# Patient Record
Sex: Female | Born: 1940 | Race: White | Hispanic: No | Smoking: Former smoker
Health system: Southern US, Community
[De-identification: ages and names within clinical notes are randomized; demographics above are authoritative.]

## PROBLEM LIST (undated history)

## (undated) DIAGNOSIS — K2210 Ulcer of esophagus without bleeding: Secondary | ICD-10-CM

## (undated) DIAGNOSIS — L255 Unspecified contact dermatitis due to plants, except food: Secondary | ICD-10-CM

## (undated) DIAGNOSIS — R0600 Dyspnea, unspecified: Secondary | ICD-10-CM

## (undated) DIAGNOSIS — M899 Disorder of bone, unspecified: Secondary | ICD-10-CM

## (undated) DIAGNOSIS — N952 Postmenopausal atrophic vaginitis: Secondary | ICD-10-CM

## (undated) DIAGNOSIS — R002 Palpitations: Secondary | ICD-10-CM

## (undated) DIAGNOSIS — D239 Other benign neoplasm of skin, unspecified: Secondary | ICD-10-CM

## (undated) DIAGNOSIS — R42 Dizziness and giddiness: Secondary | ICD-10-CM

## (undated) DIAGNOSIS — D229 Melanocytic nevi, unspecified: Secondary | ICD-10-CM

## (undated) DIAGNOSIS — Z85828 Personal history of other malignant neoplasm of skin: Secondary | ICD-10-CM

## (undated) DIAGNOSIS — K5790 Diverticulosis of intestine, part unspecified, without perforation or abscess without bleeding: Secondary | ICD-10-CM

## (undated) DIAGNOSIS — Z8673 Personal history of transient ischemic attack (TIA), and cerebral infarction without residual deficits: Secondary | ICD-10-CM

## (undated) DIAGNOSIS — R0609 Other forms of dyspnea: Secondary | ICD-10-CM

## (undated) DIAGNOSIS — Z853 Personal history of malignant neoplasm of breast: Secondary | ICD-10-CM

## (undated) DIAGNOSIS — N895 Stricture and atresia of vagina: Secondary | ICD-10-CM

## (undated) DIAGNOSIS — B009 Herpesviral infection, unspecified: Secondary | ICD-10-CM

## (undated) DIAGNOSIS — C649 Malignant neoplasm of unspecified kidney, except renal pelvis: Secondary | ICD-10-CM

## (undated) DIAGNOSIS — S72009A Fracture of unspecified part of neck of unspecified femur, initial encounter for closed fracture: Secondary | ICD-10-CM

## (undated) DIAGNOSIS — J45909 Unspecified asthma, uncomplicated: Secondary | ICD-10-CM

## (undated) DIAGNOSIS — M949 Disorder of cartilage, unspecified: Secondary | ICD-10-CM

## (undated) DIAGNOSIS — K529 Noninfective gastroenteritis and colitis, unspecified: Secondary | ICD-10-CM

## (undated) DIAGNOSIS — R5383 Other fatigue: Secondary | ICD-10-CM

## (undated) DIAGNOSIS — K219 Gastro-esophageal reflux disease without esophagitis: Secondary | ICD-10-CM

## (undated) DIAGNOSIS — R5381 Other malaise: Secondary | ICD-10-CM

## (undated) DIAGNOSIS — Z720 Tobacco use: Secondary | ICD-10-CM

## (undated) DIAGNOSIS — K5190 Ulcerative colitis, unspecified, without complications: Secondary | ICD-10-CM

## (undated) DIAGNOSIS — G1221 Amyotrophic lateral sclerosis: Secondary | ICD-10-CM

## (undated) DIAGNOSIS — G1220 Motor neuron disease, unspecified: Secondary | ICD-10-CM

## (undated) HISTORY — DX: Fracture of unspecified part of neck of unspecified femur, initial encounter for closed fracture: S72.009A

## (undated) HISTORY — DX: Diverticulosis of intestine, part unspecified, without perforation or abscess without bleeding: K57.90

## (undated) HISTORY — DX: Other fatigue: R53.83

## (undated) HISTORY — DX: Ulcer of esophagus without bleeding: K22.10

## (undated) HISTORY — DX: Dizziness and giddiness: R42

## (undated) HISTORY — DX: Personal history of other malignant neoplasm of skin: Z85.828

## (undated) HISTORY — DX: Melanocytic nevi, unspecified: D22.9

## (undated) HISTORY — DX: Postmenopausal atrophic vaginitis: N95.2

## (undated) HISTORY — DX: Disorder of bone, unspecified: M89.9

## (undated) HISTORY — DX: Other malaise: R53.81

## (undated) HISTORY — DX: Tobacco use: Z72.0

## (undated) HISTORY — DX: Personal history of malignant neoplasm of breast: Z85.3

## (undated) HISTORY — DX: Motor neuron disease, unspecified: G12.20

## (undated) HISTORY — DX: Palpitations: R00.2

## (undated) HISTORY — DX: Other forms of dyspnea: R06.09

## (undated) HISTORY — DX: Ulcerative colitis, unspecified, without complications: K51.90

## (undated) HISTORY — DX: Noninfective gastroenteritis and colitis, unspecified: K52.9

## (undated) HISTORY — DX: Unspecified contact dermatitis due to plants, except food: L25.5

## (undated) HISTORY — DX: Dyspnea, unspecified: R06.00

## (undated) HISTORY — DX: Gastro-esophageal reflux disease without esophagitis: K21.9

## (undated) HISTORY — DX: Disorder of cartilage, unspecified: M94.9

## (undated) HISTORY — DX: Personal history of transient ischemic attack (TIA), and cerebral infarction without residual deficits: Z86.73

## (undated) HISTORY — DX: Amyotrophic lateral sclerosis: G12.21

## (undated) HISTORY — PX: APPENDECTOMY: SHX54

## (undated) HISTORY — DX: Herpesviral infection, unspecified: B00.9

## (undated) HISTORY — DX: Stricture and atresia of vagina: N89.5

## (undated) HISTORY — DX: Unspecified asthma, uncomplicated: J45.909

## (undated) HISTORY — DX: Other benign neoplasm of skin, unspecified: D23.9

## (undated) HISTORY — DX: Malignant neoplasm of unspecified kidney, except renal pelvis: C64.9

---

## 1978-10-23 HISTORY — PX: MELANOMA EXCISION: SHX5266

## 1984-10-23 HISTORY — PX: ABDOMINAL HYSTERECTOMY: SHX81

## 1993-10-23 HISTORY — PX: BREAST LUMPECTOMY: SHX2

## 1998-10-23 HISTORY — PX: NEPHRECTOMY: SHX65

## 2000-10-23 HISTORY — PX: ESOPHAGOGASTRODUODENOSCOPY: SHX1529

## 2003-10-24 HISTORY — PX: HIP FRACTURE SURGERY: SHX118

## 2004-06-13 ENCOUNTER — Other Ambulatory Visit: Payer: Self-pay

## 2004-07-23 ENCOUNTER — Encounter: Payer: Self-pay | Admitting: Internal Medicine

## 2004-08-02 ENCOUNTER — Encounter: Payer: Self-pay | Admitting: Orthopaedic Surgery

## 2004-08-23 ENCOUNTER — Encounter: Payer: Self-pay | Admitting: Orthopaedic Surgery

## 2004-09-28 ENCOUNTER — Ambulatory Visit: Payer: Self-pay | Admitting: Urology

## 2004-11-30 ENCOUNTER — Ambulatory Visit: Payer: Self-pay | Admitting: Family Medicine

## 2004-12-08 ENCOUNTER — Other Ambulatory Visit: Admission: RE | Admit: 2004-12-08 | Discharge: 2004-12-08 | Payer: Self-pay | Admitting: Family Medicine

## 2004-12-08 ENCOUNTER — Ambulatory Visit: Payer: Self-pay | Admitting: Family Medicine

## 2005-01-10 ENCOUNTER — Ambulatory Visit: Payer: Self-pay | Admitting: Family Medicine

## 2005-02-01 ENCOUNTER — Ambulatory Visit: Payer: Self-pay | Admitting: General Surgery

## 2005-05-12 ENCOUNTER — Ambulatory Visit: Payer: Self-pay | Admitting: Family Medicine

## 2005-05-14 ENCOUNTER — Inpatient Hospital Stay: Payer: Self-pay | Admitting: Internal Medicine

## 2005-05-14 ENCOUNTER — Other Ambulatory Visit: Payer: Self-pay

## 2005-05-26 ENCOUNTER — Ambulatory Visit: Payer: Self-pay | Admitting: Family Medicine

## 2005-06-27 ENCOUNTER — Encounter: Payer: Self-pay | Admitting: Family Medicine

## 2005-07-05 ENCOUNTER — Ambulatory Visit: Payer: Self-pay | Admitting: Family Medicine

## 2005-07-23 ENCOUNTER — Encounter: Payer: Self-pay | Admitting: Family Medicine

## 2005-08-23 ENCOUNTER — Encounter: Payer: Self-pay | Admitting: Family Medicine

## 2005-08-28 ENCOUNTER — Ambulatory Visit: Payer: Self-pay | Admitting: Orthopaedic Surgery

## 2005-09-21 ENCOUNTER — Encounter: Payer: Self-pay | Admitting: Orthopaedic Surgery

## 2005-09-22 ENCOUNTER — Encounter: Payer: Self-pay | Admitting: Orthopaedic Surgery

## 2005-10-09 ENCOUNTER — Ambulatory Visit: Payer: Self-pay | Admitting: Urology

## 2005-12-06 ENCOUNTER — Emergency Department: Payer: Self-pay | Admitting: Emergency Medicine

## 2005-12-06 ENCOUNTER — Other Ambulatory Visit: Payer: Self-pay

## 2005-12-08 ENCOUNTER — Ambulatory Visit: Payer: Self-pay | Admitting: Family Medicine

## 2005-12-10 ENCOUNTER — Encounter: Admission: RE | Admit: 2005-12-10 | Discharge: 2005-12-10 | Payer: Self-pay | Admitting: Family Medicine

## 2005-12-15 ENCOUNTER — Ambulatory Visit: Payer: Self-pay | Admitting: Family Medicine

## 2006-02-22 ENCOUNTER — Ambulatory Visit: Payer: Self-pay | Admitting: General Surgery

## 2006-07-12 ENCOUNTER — Other Ambulatory Visit: Admission: RE | Admit: 2006-07-12 | Discharge: 2006-07-12 | Payer: Self-pay | Admitting: Family Medicine

## 2006-07-12 ENCOUNTER — Ambulatory Visit: Payer: Self-pay | Admitting: Family Medicine

## 2006-07-12 ENCOUNTER — Encounter: Payer: Self-pay | Admitting: Family Medicine

## 2006-07-12 LAB — CONVERTED CEMR LAB: Pap Smear: NORMAL

## 2006-08-08 LAB — HM PAP SMEAR: HM Pap smear: NORMAL

## 2006-09-17 ENCOUNTER — Ambulatory Visit: Payer: Self-pay | Admitting: Family Medicine

## 2006-10-08 ENCOUNTER — Ambulatory Visit: Payer: Self-pay | Admitting: Urology

## 2006-10-09 ENCOUNTER — Ambulatory Visit: Payer: Self-pay | Admitting: Urology

## 2007-04-22 ENCOUNTER — Ambulatory Visit: Payer: Self-pay | Admitting: General Surgery

## 2007-05-16 ENCOUNTER — Encounter: Payer: Self-pay | Admitting: Family Medicine

## 2007-05-16 DIAGNOSIS — R002 Palpitations: Secondary | ICD-10-CM | POA: Insufficient documentation

## 2007-05-16 DIAGNOSIS — K219 Gastro-esophageal reflux disease without esophagitis: Secondary | ICD-10-CM | POA: Insufficient documentation

## 2007-05-16 DIAGNOSIS — Z853 Personal history of malignant neoplasm of breast: Secondary | ICD-10-CM

## 2007-05-16 DIAGNOSIS — Z85828 Personal history of other malignant neoplasm of skin: Secondary | ICD-10-CM

## 2007-05-16 DIAGNOSIS — M949 Disorder of cartilage, unspecified: Secondary | ICD-10-CM

## 2007-05-16 DIAGNOSIS — B009 Herpesviral infection, unspecified: Secondary | ICD-10-CM | POA: Insufficient documentation

## 2007-05-16 DIAGNOSIS — M899 Disorder of bone, unspecified: Secondary | ICD-10-CM | POA: Insufficient documentation

## 2007-05-16 DIAGNOSIS — K5190 Ulcerative colitis, unspecified, without complications: Secondary | ICD-10-CM | POA: Insufficient documentation

## 2007-05-16 DIAGNOSIS — C649 Malignant neoplasm of unspecified kidney, except renal pelvis: Secondary | ICD-10-CM | POA: Insufficient documentation

## 2007-05-16 DIAGNOSIS — J45909 Unspecified asthma, uncomplicated: Secondary | ICD-10-CM | POA: Insufficient documentation

## 2007-05-17 ENCOUNTER — Ambulatory Visit: Payer: Self-pay | Admitting: Family Medicine

## 2007-08-28 ENCOUNTER — Telehealth: Payer: Self-pay | Admitting: Family Medicine

## 2007-10-11 ENCOUNTER — Encounter: Payer: Self-pay | Admitting: Family Medicine

## 2007-10-11 ENCOUNTER — Ambulatory Visit: Payer: Self-pay | Admitting: Urology

## 2007-11-11 ENCOUNTER — Encounter: Payer: Self-pay | Admitting: Family Medicine

## 2007-11-11 ENCOUNTER — Telehealth (INDEPENDENT_AMBULATORY_CARE_PROVIDER_SITE_OTHER): Payer: Self-pay | Admitting: *Deleted

## 2007-11-28 ENCOUNTER — Telehealth: Payer: Self-pay | Admitting: Family Medicine

## 2007-12-24 ENCOUNTER — Telehealth: Payer: Self-pay | Admitting: Family Medicine

## 2007-12-27 ENCOUNTER — Ambulatory Visit: Payer: Self-pay | Admitting: Unknown Physician Specialty

## 2007-12-27 ENCOUNTER — Encounter: Payer: Self-pay | Admitting: Family Medicine

## 2008-01-28 ENCOUNTER — Telehealth: Payer: Self-pay | Admitting: Family Medicine

## 2008-02-20 ENCOUNTER — Ambulatory Visit: Payer: Self-pay | Admitting: Family Medicine

## 2008-04-02 ENCOUNTER — Encounter: Payer: Self-pay | Admitting: Family Medicine

## 2008-04-22 ENCOUNTER — Ambulatory Visit: Payer: Self-pay | Admitting: Radiation Oncology

## 2008-05-04 ENCOUNTER — Ambulatory Visit: Payer: Self-pay | Admitting: General Surgery

## 2008-05-14 ENCOUNTER — Ambulatory Visit: Payer: Self-pay | Admitting: Family Medicine

## 2008-05-14 DIAGNOSIS — R5381 Other malaise: Secondary | ICD-10-CM

## 2008-05-14 DIAGNOSIS — D239 Other benign neoplasm of skin, unspecified: Secondary | ICD-10-CM | POA: Insufficient documentation

## 2008-05-14 DIAGNOSIS — R5383 Other fatigue: Secondary | ICD-10-CM

## 2008-05-15 ENCOUNTER — Encounter: Payer: Self-pay | Admitting: Family Medicine

## 2008-05-18 LAB — CONVERTED CEMR LAB
ALT: 12 units/L (ref 0–35)
AST: 19 units/L (ref 0–37)
Alkaline Phosphatase: 42 units/L (ref 39–117)
Bilirubin, Direct: 0.1 mg/dL (ref 0.0–0.3)
CO2: 31 meq/L (ref 19–32)
Chloride: 104 meq/L (ref 96–112)
Creatinine, Ser: 1.1 mg/dL (ref 0.4–1.2)
Eosinophils Relative: 1.1 % (ref 0.0–5.0)
Folate: >20 ng/mL
HCT: 39.3 % (ref 36.0–46.0)
Hemoglobin: 13.5 g/dL (ref 12.0–15.0)
Lymphocytes Relative: 26.5 % (ref 12.0–46.0)
Monocytes Absolute: 0.3 10*3/uL (ref 0.1–1.0)
Monocytes Relative: 5.9 % (ref 3.0–12.0)
Platelets: 226 10*3/uL (ref 150–400)
Potassium: 4.1 meq/L (ref 3.5–5.1)
Total Bilirubin: 0.7 mg/dL (ref 0.3–1.2)
WBC: 5.7 10*3/uL (ref 4.5–10.5)

## 2008-05-29 ENCOUNTER — Encounter: Payer: Self-pay | Admitting: Family Medicine

## 2008-06-01 ENCOUNTER — Ambulatory Visit: Payer: Self-pay | Admitting: Family Medicine

## 2008-06-25 ENCOUNTER — Ambulatory Visit: Payer: Self-pay | Admitting: Pulmonary Disease

## 2008-06-25 DIAGNOSIS — R252 Cramp and spasm: Secondary | ICD-10-CM | POA: Insufficient documentation

## 2008-10-05 ENCOUNTER — Ambulatory Visit: Payer: Self-pay | Admitting: Urology

## 2008-10-05 ENCOUNTER — Encounter: Payer: Self-pay | Admitting: Family Medicine

## 2008-11-01 ENCOUNTER — Encounter: Payer: Self-pay | Admitting: Family Medicine

## 2008-12-29 ENCOUNTER — Ambulatory Visit: Payer: Self-pay | Admitting: Family Medicine

## 2009-01-21 ENCOUNTER — Ambulatory Visit: Payer: Self-pay | Admitting: Unknown Physician Specialty

## 2009-01-21 ENCOUNTER — Encounter: Payer: Self-pay | Admitting: Family Medicine

## 2009-01-21 HISTORY — PX: COLONOSCOPY: SHX174

## 2009-04-30 ENCOUNTER — Ambulatory Visit: Payer: Self-pay | Admitting: Family Medicine

## 2009-04-30 DIAGNOSIS — Z8679 Personal history of other diseases of the circulatory system: Secondary | ICD-10-CM

## 2009-04-30 LAB — CONVERTED CEMR LAB
HDL goal, serum: 40 mg/dL
LDL Goal: 160 mg/dL

## 2009-05-04 ENCOUNTER — Ambulatory Visit: Payer: Self-pay | Admitting: Internal Medicine

## 2009-05-04 ENCOUNTER — Encounter: Payer: Self-pay | Admitting: Family Medicine

## 2009-05-10 ENCOUNTER — Encounter: Payer: Self-pay | Admitting: Family Medicine

## 2009-05-19 ENCOUNTER — Encounter (INDEPENDENT_AMBULATORY_CARE_PROVIDER_SITE_OTHER): Payer: Self-pay | Admitting: *Deleted

## 2009-10-04 ENCOUNTER — Ambulatory Visit: Payer: Self-pay | Admitting: Urology

## 2009-10-04 ENCOUNTER — Encounter: Payer: Self-pay | Admitting: Family Medicine

## 2009-11-08 ENCOUNTER — Encounter: Payer: Self-pay | Admitting: Family Medicine

## 2010-02-08 ENCOUNTER — Telehealth: Payer: Self-pay | Admitting: Family Medicine

## 2010-06-23 ENCOUNTER — Ambulatory Visit: Payer: Self-pay | Admitting: Family Medicine

## 2010-06-23 DIAGNOSIS — J309 Allergic rhinitis, unspecified: Secondary | ICD-10-CM | POA: Insufficient documentation

## 2010-06-23 DIAGNOSIS — N952 Postmenopausal atrophic vaginitis: Secondary | ICD-10-CM

## 2010-07-06 LAB — CONVERTED CEMR LAB
ALT: 15 units/L (ref 0–35)
Albumin: 4 g/dL (ref 3.5–5.2)
Alkaline Phosphatase: 51 units/L (ref 39–117)
Basophils Relative: 0.4 % (ref 0.0–3.0)
Bilirubin, Direct: 0.1 mg/dL (ref 0.0–0.3)
CO2: 27 meq/L (ref 19–32)
Chloride: 105 meq/L (ref 96–112)
Eosinophils Absolute: 0.1 10*3/uL (ref 0.0–0.7)
Eosinophils Relative: 0.9 % (ref 0.0–5.0)
Folate: >20 ng/mL
Hemoglobin: 13.2 g/dL (ref 12.0–15.0)
MCHC: 33.5 g/dL (ref 30.0–36.0)
MCV: 90.3 fL (ref 78.0–100.0)
Monocytes Absolute: 0.4 10*3/uL (ref 0.1–1.0)
Neutro Abs: 3.9 10*3/uL (ref 1.4–7.7)
Potassium: 4.1 meq/L (ref 3.5–5.1)
RBC: 4.38 M/uL (ref 3.87–5.11)
Sodium: 142 meq/L (ref 135–145)
Total Protein: 6.8 g/dL (ref 6.0–8.3)
Vitamin B-12: 627 pg/mL (ref 211–911)
WBC: 6 10*3/uL (ref 4.5–10.5)

## 2010-08-17 ENCOUNTER — Ambulatory Visit: Payer: Self-pay | Admitting: Family Medicine

## 2010-08-17 ENCOUNTER — Encounter: Payer: Self-pay | Admitting: Family Medicine

## 2010-08-17 DIAGNOSIS — R079 Chest pain, unspecified: Secondary | ICD-10-CM | POA: Insufficient documentation

## 2010-08-29 ENCOUNTER — Telehealth: Payer: Self-pay | Admitting: Family Medicine

## 2010-09-06 ENCOUNTER — Encounter: Payer: Self-pay | Admitting: Family Medicine

## 2010-09-06 ENCOUNTER — Ambulatory Visit: Payer: Self-pay | Admitting: Family Medicine

## 2010-09-19 ENCOUNTER — Encounter: Payer: Self-pay | Admitting: Family Medicine

## 2010-09-19 ENCOUNTER — Encounter (INDEPENDENT_AMBULATORY_CARE_PROVIDER_SITE_OTHER): Payer: Self-pay | Admitting: *Deleted

## 2010-10-03 ENCOUNTER — Ambulatory Visit: Payer: Self-pay | Admitting: Urology

## 2010-10-03 ENCOUNTER — Encounter: Payer: Self-pay | Admitting: Family Medicine

## 2010-11-07 ENCOUNTER — Encounter: Payer: Self-pay | Admitting: Family Medicine

## 2010-11-24 NOTE — Letter (Signed)
Summary: [facility]   Imported By: Lanelle Bal 10/10/2010 12:07:44  _____________________________________________________________________  External Attachment:    Type:   Image     Comment:   External Document

## 2010-11-24 NOTE — Miscellaneous (Signed)
   Clinical Lists Changes  Observations: Added new observation of MAMMO DUE: 09/2011 (09/19/2010 13:20) Added new observation of MAMMOGRAM: normal (09/09/2010 13:21)      Preventive Care Screening  Mammogram:    Date:  09/09/2010    Next Due:  09/2011    Results:  normal

## 2010-11-24 NOTE — Assessment & Plan Note (Signed)
Summary: MEDICARE AARP CPX  CYD   Vital Signs:  Patient profile:   70 year old female Height:      65 inches Weight:      134 pounds BMI:     22.38 Temp:     98.3 degrees F oral Pulse rate:   72 / minute Pulse rhythm:   regular BP sitting:   110 / 70  (left arm) Cuff size:   regular  Vitals Entered By: Lewanda Rife LPN (June 23, 2010 9:46 AM) CC: check up with prev hysterectomy  Vision Screening:Left eye with correction: 20 / 30 Right eye with correction: 20 / 25 Both eyes with correction: 20 / 25        Vision Entered By: Lewanda Rife LPN (June 23, 2010 9:47 AM)  Hearing Screen 25db HL: Left  500 hz: 25db 1000 hz: 25db 2000 hz: 25db 4000 hz: 25db Right  500 hz: 25db 1000 hz: 25db 2000 hz: 25db 4000 hz: 25db    History of Present Illness: here for check up of chronic med problems and to review health mt list   had a good summer  is feeling fine overall   have noticed more tired than usual  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   sees Dr Achilles Dunk -- is watching her hx of renal cancer -- that is ok   chol in jan at [facility] -- trig 97, and HDL 67, LDL 65  AIC 5.0   wt is down 3 lb  bp is 110/70  pap 9/07 had tot hyst in past  no new gyn symptoms  her hsv outbreaks -- come and go  she does not want to go on proph tx   6/08 mam  gets chest x ray every year  she agrees to get mam herself this year (has hx of cancer)  she does want breast exam   had fall at work -- and fell getting into a chair  had to see PA at the hospital - and did not break anything -- big bruise- getting better     colonosc 4/10  dexa 7/10- osteopenia  is not compliant all the time with ca and vit D   pneumovax 06 Td06 refused tdap from work  problems with hayfever --has used zyrtec and benadryl - used to work    Allergies: 1)  Sulfa 2)  Cipro  Past History:  Past Surgical History: Last updated: 01/31/2009 Melanoma- excised (1980) Hysterectomy-  endometriosis (1986) Lumpectomy- right breast cancer (1995) Nephrectomy- left, renal cell carcinoma (2000) Hip fracture- surgery, hemi arthroplasty (2005) EGD- esophagitis, grade 1, erosive (2002) Colonoscopy- colitis ? (2002) Dexa- osteoporosis (08/2004) Dexa- osteopenia (06/1997) Flex sig- colitis (12/2002) [place] ([place]) Admit- chest pain, r/o MI, neg PE (05/2005) Carotid doppler (11/2005) MRI head- old isch changes (11/2005) 2D Echo- mild MR/TR Dexa- OP, increased in spine, down in hip (09/2006) 3/09 EGD non bleeding erosive gastopathy, irregular Z line 3/09 colonoscopy polyp, hemorrhoids 4/10 colonoscopy tics re check 5 years  Family History: Last updated: 06/25/2008 Paternal grandmother - heart disease Maternal grandmother - stomach cancer  Social History: Last updated: 02/20/2008 Marital Status: widowed Children: none Occupation: works in healthcare  Risk Factors: Smoking Status: quit (05/16/2007)  Past Medical History:  Current Problems:  FATIGUE (ICD-780.79) MOLE (ICD-216.9) CONTACT DERMATITIS DUE TO POISON IVY (ICD-692.6) DIZZINESS (ICD-780.4) EXAMINATION, PREOPERATIVE NOS (ICD-V72.84) Hx of PALPITATIONS (ICD-785.1) 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 Eyes:  Denies blurring and eye irritation. ENT:  Complains of postnasal drainage. CV:  Denies chest pain or discomfort, palpitations, and shortness of breath with exertion. Resp:  Denies cough, shortness of breath, sputum productive, and wheezing. GI:  Denies abdominal pain, bloody stools, change in bowel habits, indigestion, nausea, and  vomiting. GU:  Denies abnormal vaginal bleeding, discharge, genital sores, and urinary frequency. MS:  Denies muscle aches and cramps. Derm:  Denies itching, lesion(s), poor wound healing, and rash. Neuro:  Denies numbness, tingling, and weakness. Psych:  mood is fair. Endo:  Denies cold intolerance, excessive thirst, excessive urination, and heat intolerance. Heme:  Denies abnormal bruising and bleeding.  Physical Exam  General:  Well-developed,well-nourished,in no acute distress; alert,appropriate and cooperative throughout examination Head:  normocephalic, atraumatic, and no abnormalities observed.   Eyes:  vision grossly intact, pupils equal, pupils round, and pupils reactive to light.  no conjunctival pallor, injection or icterus  Ears:  R ear normal and L ear normal.   Nose:  nares are clear but boggy Mouth:  pharynx pink and moist, no erythema, and no exudates.   Neck:  supple with full rom and no masses or thyromegally, no JVD or carotid bruit  Chest Wall:  No deformities, masses, or tenderness noted. Breasts:  No mass, nodules, thickening, tenderness, bulging, retraction, inflamation, nipple discharge or skin changes noted.   Lungs:  Normal respiratory effort, chest expands symmetrically. Lungs are clear to auscultation, no crackles or wheezes. Heart:  Normal rate and regular rhythm. S1 and S2 normal without gallop, murmur, click, rub or other extra sounds. Abdomen:  Bowel sounds positive,abdomen soft and non-tender without masses, organomegaly or hernias noted. no renal bruits  Msk:  No deformity or scoliosis noted of thoracic or lumbar spine.  no acute joint changes  Pulses:  R and L carotid,radial,femoral,dorsalis pedis and posterior tibial pulses are full and equal bilaterally Extremities:  No clubbing, cyanosis, edema, or deformity noted with normal full range of motion of all joints.   Neurologic:  sensation intact to light touch, gait normal, and DTRs symmetrical and normal.    Skin:  Intact without suspicious lesions or rashes few lentigos  Cervical Nodes:  No lymphadenopathy noted Axillary Nodes:  No palpable lymphadenopathy Inguinal Nodes:  No significant adenopathy Psych:  seems generally unhappy today   Impression & Recommendations:  Problem # 1:  FATIGUE (ICD-780.79) Assessment Deteriorated labs today - may be multifactorial / schedule playing role Orders: Venipuncture (40981) TLB-BMP (Basic Metabolic Panel-BMET) (80048-METABOL) TLB-CBC Platelet - w/Differential (85025-CBCD) TLB-Hepatic/Liver Function Pnl (80076-HEPATIC) TLB-TSH (Thyroid Stimulating Hormone) (84443-TSH) T-Vitamin D (25-Hydroxy) (19147-82956) TLB-B12 + Folate Pnl (21308_65784-O96/EXB)  Problem # 2:  OSTEOPENIA (ICD-733.90) Assessment: Unchanged rev ca and vit D check D level rev last dexa  Her updated medication list for this problem includes:    Calcium 500/vitamin D 500-125 Mg-unit Tabs (Calcium carbonate-vitamin d) .Marland Kitchen... 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 Use 2 x a month as directed  Problem # 6:  ALLERGIC RHINITIS (ICD-477.9) Assessment: Deteriorated will try changing to different otc antihistamine and update consider nasal steriod if not improved   Complete Medication List: 1)  Omeprazole 20 Mg Cpdr (Omeprazole) .... One by mouth every other day 2)  Valtrex 500 Mg Tabs (Valacyclovir hcl) .... Take 1 by mouth two times a day for outbreak for 7 days 3)  Premarin 0.625 Mg/gm Crea (Estrogens, conjugated) .... Use 2 x a month as directed 4)  Calcium 500/vitamin D 500-125 Mg-unit Tabs (Calcium carbonate-vitamin d) .... Take one by mouth daily 5)  Centrum Silver Tabs (Multiple vitamins-minerals) .... Take one by mouth daily 6)  Osteo Bi-flex Regular Strength 250-200 Mg Tabs (Glucosamine-chondroitin) .... Take one by mouth daily 7)  Aspirin 81 Mg Tbec (Aspirin) .Marland Kitchen.. 1 daily by mouth  Patient Instructions: 1)  try switching your antihistamine from zyrtec to allegra or claritin over the counter - every 1-2 years to see if that helps  2)  call your insurance about the price difference between premarin cream and estrace cream and let me know  3)  labs today for fatigue and also vitamin D level 4)  for outbreaks start taking valtrex 500 two times a day for 7 days  5)  I do recommend a mammogram -- so I will let you set that up yourself  6)  your cholesterol and sugar tests look great  Prescriptions: VALTREX 500 MG  TABS (VALACYCLOVIR HCL) take 1 by mouth two times a day for outbreak for 7 days  #28 x 5   Entered and Authorized by:   Judith Part MD   Signed by:   Judith Part MD on 06/23/2010   Method used:   Electronically to        [id number] (ret[facility])       [address]       [place], [place]  [address]       Ph: [phone]       Fax: [phone]   RxID:   [id number]   Current Allergies (reviewed today): SULFA CIPRO

## 2010-11-24 NOTE — Progress Notes (Signed)
Summary: regarding Tdap  Phone Note Call from Patient   Caller: Patient Call For: Judith Part MD Summary of Call: Pt works in an area at [employer] [facility] [facility] [employer] [employer] [facility] [facility] [employer] where they are recommending the employees get Tdap.  Pt states she is never around any children so she is asking if it's necessary for her to get this.  Advised no, if she is not around any children. Initial call taken by: Lowella Petties CMA,  February 08, 2010 2:42 PM  Follow-up for Phone Call        this is more important if she is around children and babies -- but if her work requires it - we can give her one Follow-up by: Judith Part MD,  February 09, 2010 11:03 PM  Additional Follow-up for Phone Call Additional follow up Details #1::        Patient notified as instructed by telephone. Pt said [facility] was going to give tdap to her but she refused it.Lewanda Rife LPN  February 10, 2010 8:36 AM

## 2010-11-24 NOTE — Letter (Signed)
Summary: Results Follow up Letter  Start at [facility]  [address] [place], [place] [address]   Phone: [phone]  Fax: [phone]    09/19/2010 MRN: [id number]  [facility] [address] [place], [place]  [address]  Dear Ms. Edmon Crape,  The following are the results of your recent test(s):  Test         Result    Pap Smear:        Normal _____  Not Normal _____ Comments: ______________________________________________________ Cholesterol: LDL(Bad cholesterol):         Your goal is less than:         HDL (Good cholesterol):       Your goal is more than: Comments:  ______________________________________________________ Mammogram:        Normal __X___  Not Normal _____ Comments:Repeat in 1 year  ___________________________________________________________________ Hemoccult:        Normal _____  Not normal _______ Comments:    _____________________________________________________________________ Other Tests:    We routinely do not discuss normal results over the telephone.  If you desire a copy of the results, or you have any questions about this information we can discuss them at your next office visit.   Sincerely,      Janee Morn, CMA for Dr. Roxy Manns

## 2010-11-24 NOTE — Progress Notes (Signed)
Summary: needs order for mammogram  Phone Note Call from Patient   Caller: Patient Call For: Judith Part MD Summary of Call: Pt has scheduled a mammogram at [facility] for 11/15.  She needs order faxed. Initial call taken by: Lowella Petties CMA, AAMA,  August 29, 2010 11:35 AM  Follow-up for Phone Call        will do order for [facility] Follow-up by: Judith Part MD,  August 29, 2010 1:36 PM

## 2010-11-24 NOTE — Assessment & Plan Note (Signed)
Summary: CHEST PAIN OFF AND ON X 2 DAYS   Vital Signs:  Patient profile:   70 year old female Height:      65 inches Weight:      137.25 pounds BMI:     22.92 Temp:     99.1 degrees F oral Pulse rate:   76 / minute Pulse rhythm:   regular BP sitting:   124 / 72  (left arm) Cuff size:   regular  Vitals Entered By: Lewanda Rife LPN (August 17, 2010 2:32 PM) CC: chest pain    History of Present Illness: woke up at 3 am with weird pain in L mid chest - thought it was her breast off and on all day tuesday (10 minutes at a time) today- almost all day not sore to touch   sometimes sharp like pins and other times a dull pain  some soreness to take a deep breath  last night it went to L shoulder -- better now     it may be worse with exertion- but at other times not  no nausea or clamminess or sob or sweating   this am - no appetite - perhaps stomach was a bit upset  thinks it may be from a [employer] stool softener   thought this was indigestion at first  burping did not relieve it  no acid in mouth on omeprazole every other day   is very stressed right now  finacial stress   going to her sister's tomorrow - in [place] , and looking foward to some rest there    Allergies: 1)  Sulfa 2)  Cipro  Past History:  Past Medical History: Last updated: 06/23/2010  Current Problems:  FATIGUE (ICD-780.79) MOLE (ICD-216.9) CONTACT DERMATITIS DUE TO POISON IVY (ICD-692.6) DIZZINESS (ICD-780.4) EXAMINATION, PREOPERATIVE NOS (ICD-V72.84) Hx of PALPITATIONS (ICD-785.1) 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 (2002) Dexa- osteoporosis (08/2004) Dexa- osteopenia (06/1997) Flex sig- colitis (12/2002) [place]onia- [place] (04/2005) Admit- chest pain, r/o MI, neg PE (05/2005) Carotid doppler (11/2005) MRI head- old isch changes (11/2005) 2D Echo- mild MR/TR Dexa- OP, increased in spine, down in hip (09/2006) 3/09 EGD non bleeding erosive gastopathy, irregular Z line 3/09 colonoscopy polyp, hemorrhoids 4/10 colonoscopy tics re check 5 years  Family History: Last updated: 06/25/2008 Paternal grandmother - heart disease Maternal grandmother - stomach cancer  Social History: Last updated: 02/20/2008 Marital Status: widowed Children: none Occupation: works in Teacher, music  Risk Factors: Smoking Status: quit (05/16/2007)  Physical Exam  General:  Well-developed,well-nourished,in no acute distress; alert,appropriate and cooperative throughout examination Head:  normocephalic, atraumatic, and no abnormalities observed.   Eyes:  vision grossly intact, pupils equal, pupils round, and pupils reactive to light.  no conjunctival pallor, injection or icterus  Ears:  R ear normal and L ear normal.   Nose:  no nasal discharge.   Mouth:  pharynx pink and moist.   Neck:  supple with full rom and no masses or thyromegally, no JVD or carotid bruit  Chest Wall:  No deformities, masses, or tenderness noted.  Lungs:  Normal respiratory effort, chest expands symmetrically. Lungs are clear to auscultation, no crackles or wheezes. focal pain in L chest with deep breath- but area not tender Heart:  Normal rate and regular rhythm. S1 and S2 normal without gallop, murmur, click, rub or other extra sounds. Abdomen:  Bowel sounds positive,abdomen soft and non-tender without masses, organomegaly or hernias noted. Msk:  No  deformity or scoliosis noted of thoracic or lumbar spine.   Pulses:  R and L carotid,radial,femoral,dorsalis pedis and posterior tibial pulses are full and equal bilaterally Extremities:  No clubbing, cyanosis, edema, or deformity noted with normal full range of motion of all joints.   no leg tenderness/ no palp cords  Neurologic:  sensation intact to light touch, gait normal, and DTRs symmetrical and normal.  no tremor  Skin:  breast scars evident no rash nl color and turgor  Cervical Nodes:  No lymphadenopathy noted Axillary Nodes:  No palpable lymphadenopathy Inguinal Nodes:  No significant adenopathy Psych:  pt seems generally stressed and fatigued she talks freely of her situational stress    Impression & Recommendations:  Problem # 1:  CHEST PAIN (ICD-786.50) Assessment New atypical with some pleuritic component  ekg nl  cxr now - pend report  suspect costochondritis/ also stress reaciton ad to try warm compress/analgesics as needed and go to eR out of town if symptoms progress disc red flags to watch for -- sob/ inc pain/ exertional symptoms -- will call or seek care where she is  Orders: T-2 View CXR (71020TC) EKG w/ Interpretation (93000)  Complete Medication List: 1)  Omeprazole 20 Mg Cpdr (Omeprazole) .... One by mouth every other day 2)  Valtrex 500 Mg Tabs (Valacyclovir hcl) .... Take 1 by mouth two times a day for outbreak for 7 days as needed 3)  Premarin 0.625 Mg/gm Crea (Estrogens, conjugated) .... Use 2 x a month as directed 4)  Calcium 500/vitamin D 500-125 Mg-unit Tabs (Calcium carbonate-vitamin d) .... Take one by mouth daily 5)  Centrum Silver Tabs (Multiple vitamins-minerals) .... Take one by mouth daily 6)  Osteo Bi-flex Regular Strength 250-200 Mg Tabs (Glucosamine-chondroitin) .... Take one by mouth daily 7)  Aspirin 81 Mg Tbec (Aspirin) .Marland Kitchen.. 1 daily by mouth  Patient Instructions: 1)  stop your current stool softener and try name brand colace or  miralax instead  2)  make sure to drink enough water  3)  chest x ray today  4)  ekg is re- assuring  5)  if your symptoms worsen or shortness of breath or other new things out of town -- seek care at an emergency room  6)  try warm compress/ tylenol as needed for pain    Orders Added: 1)  T-2 View CXR [71020TC] 2)  EKG w/ Interpretation [93000] 3)  Est. Patient Level IV [[address]]    Current Allergies (reviewed today): SULFA CIPRO  EKG  Procedure date:  08/17/2010  Findings:      NSR with no acute changes  Appended Document: CHEST PAIN OFF AND ON X 2 DAYS Copy of office note, cxr report, EKG report mailed to pt's home per her request.

## 2010-12-14 NOTE — Miscellaneous (Signed)
Summary: [facility]   [facility]   Imported By: Kassie Mends 12/05/2010 10:21:32  _____________________________________________________________________  External Attachment:    Type:   Image     Comment:   External Document

## 2011-02-23 ENCOUNTER — Telehealth: Payer: Self-pay | Admitting: *Deleted

## 2011-02-23 NOTE — Telephone Encounter (Signed)
Pt advised.

## 2011-02-23 NOTE — Telephone Encounter (Signed)
Agree with above  Keep sites clean with antibacterial soap and water

## 2011-02-23 NOTE — Telephone Encounter (Signed)
Pt states she has pulled 2 ticks off of her since yesterday, asked if she should come in.  Advised not unless she has symptoms of illness- rash, fever, nausea, aches, fatigue, not feeling well.  Pt agreed.

## 2011-04-18 ENCOUNTER — Other Ambulatory Visit: Payer: Self-pay | Admitting: *Deleted

## 2011-04-18 NOTE — Telephone Encounter (Signed)
Pt is asking that acyclovir be called to [facility]. She has previously been on valacyclovir, but her insurance has changed and they are charging her more for the valacyclovir now.  Please send in new script.

## 2011-04-19 NOTE — Telephone Encounter (Signed)
Ask her how long her outbreaks are and ? Cold sores vs other hsv  Then I will px  Remind her I'm trying to work remotely from out of town and checking messages sporatically I just need to know what dosing to use and how much to px per month thanks

## 2011-04-19 NOTE — Telephone Encounter (Signed)
Pt called back and said she does not have cold sores she has genital herpes. Pt wanted me to be clear that the Valacyclovir or Valtrex she has been taking is going to cost $75.00 and the Acyclovir will only cost $5.00 but pt wants Dr Royden Purl opinion if the Acyclovir or Zovirax will give as good of results. Pt can be reached at [phone]. Pt is aware that Dr Milinda Antis is out of town this week and only periodically Estate agent.

## 2011-04-19 NOTE — Telephone Encounter (Signed)
Patient notified as instructed by telephone. Pt said length of her outbreaks are 4-5 days and she may be having occurrences every 6 wks or so. Pt said there is a $75.00 difference between Acyclovir and Valacyclovir. When I asked pt if cold sores vs other hsv; pt said she was at work and could not talk about this now. I apologized and told pt to call back at her convenience.

## 2011-04-20 MED ORDER — ACYCLOVIR 400 MG PO TABS: 400.0000 mg | ORAL_TABLET | Freq: Three times a day (TID) | ORAL | Status: AC

## 2011-04-20 NOTE — Telephone Encounter (Signed)
I think she should at least try the acyclovir and see how she does  Px written for call in

## 2011-04-20 NOTE — Telephone Encounter (Signed)
Patient notified as instructed by telephone. Medication phoned to [facility] pharmacy as instructed.

## 2011-06-23 ENCOUNTER — Encounter: Payer: Self-pay | Admitting: Family Medicine

## 2011-06-27 ENCOUNTER — Encounter: Payer: Self-pay | Admitting: Family Medicine

## 2011-06-27 ENCOUNTER — Telehealth: Payer: Self-pay | Admitting: Radiology

## 2011-06-27 ENCOUNTER — Ambulatory Visit (INDEPENDENT_AMBULATORY_CARE_PROVIDER_SITE_OTHER): Payer: Medicare Other | Admitting: Family Medicine

## 2011-06-27 DIAGNOSIS — M899 Disorder of bone, unspecified: Secondary | ICD-10-CM

## 2011-06-27 DIAGNOSIS — K219 Gastro-esophageal reflux disease without esophagitis: Secondary | ICD-10-CM

## 2011-06-27 DIAGNOSIS — R079 Chest pain, unspecified: Secondary | ICD-10-CM

## 2011-06-27 DIAGNOSIS — Z13220 Encounter for screening for lipoid disorders: Secondary | ICD-10-CM

## 2011-06-27 DIAGNOSIS — C649 Malignant neoplasm of unspecified kidney, except renal pelvis: Secondary | ICD-10-CM

## 2011-06-27 DIAGNOSIS — K5190 Ulcerative colitis, unspecified, without complications: Secondary | ICD-10-CM

## 2011-06-27 DIAGNOSIS — Z853 Personal history of malignant neoplasm of breast: Secondary | ICD-10-CM

## 2011-06-27 DIAGNOSIS — M949 Disorder of cartilage, unspecified: Secondary | ICD-10-CM

## 2011-06-27 MED ORDER — ACYCLOVIR 400 MG PO TABS: 400.0000 mg | ORAL_TABLET | Freq: Four times a day (QID) | ORAL | Status: DC

## 2011-06-27 NOTE — Assessment & Plan Note (Signed)
Takes combo of omeprazole and ranitidine per GI Doing ok overall

## 2011-06-27 NOTE — Progress Notes (Signed)
Subjective:    Patient ID: Mary Fields, female    DOB: 29-Nov-1940, 70 y.o.   MRN: [id number]  HPI Here for annual check up of chronic medical problems and to rev health mt list   Had a bad fall this summer -when she tripped under her dogs  Some days limps and other days fine  Did talk to ortho    Td06 ptx 06 colonosc (with ulcerative colitis) nl in 2010 - due again in 5 y- the omeprazole for gerd gave her diarrhea  She now takes zantac and omeprazole every other day  Had zostavax in 2010     Mam nl 11/11 Hx of breast cancer Self exam- no changes  No reoccurance   Osteopenia  dexa 2010 was improved Vit D level ok at 36 She would like to get her next dexa in January   pwt is stable with bmi of 21- eats a healthy diet   Had hyst in past  For endometriosis  She uses her estrogen cream every 2 weeks   Uses valtrex -- for occ outbreaks genital and buttock   Patient Active Problem List  Diagnoses  . HERPES SIMPLEX INFECTION  . RENAL CELL CANCER  . MOLE  . ALLERGIC RHINITIS  . ASTHMA  . GERD  . COLITIS, ULCERATIVE  . VAGINITIS, ATROPHIC, MILD  . NOCTURNAL CRAMPS  . OSTEOPENIA  . FATIGUE  . PALPITATIONS  . Personal History of Malignant Neoplasm of Breast  . SKIN CANCER, HX OF  . TRANSIENT ISCHEMIC ATTACKS, HX OF  . Screening for lipoid disorders   Past Medical History  Diagnosis Date  . Other malaise and fatigue   . Benign neoplasm of skin, site unspecified   . 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 Dizziness and giddiness   . Palpitations   . Herpes simplex without mention of complication   . Ulcerative colitis, unspecified   . Malignant neoplasm of kidney, except pelvis   . Disorder of bone and cartilage, unspecified   . Esophageal reflux   . Personal history of other malignant neoplasm of skin   . Personal history of malignant neoplasm of breast   . Unspecified asthma   . Hx-TIA (transient ischemic attack)   . Atypical  nevi   . Diverticulosis   . Erosive esophagitis   . Colitis    Past Surgical History  Procedure Date  . Melanoma excision 1980  . Abdominal hysterectomy 1986    endometriosis  . Breast lumpectomy 1995    right breast cancer  . Nephrectomy 2000    left renal cell carcinoma  . Hip fracture surgery 2005    hemiarthroplasty  . Esophagogastroduodenoscopy 2002    esophagitis; grade 1, erosive  . Colonoscopy 4/10    diverticulosis; recheck 5 years   History  Substance Use Topics  . Smoking status: Never Smoker   . Smokeless tobacco: Not on file  . Alcohol Use: Not on file   Family History  Problem Relation Age of Onset  . Heart disease Paternal Grandmother   . Stomach cancer Maternal Grandmother    Allergies  Allergen Reactions  . Ciprofloxacin     REACTION: diarrhea  . Sulfonamide Derivatives     REACTION: whelps   Current Outpatient Prescriptions on File Prior to Visit  Medication Sig Dispense Refill  . aspirin 81 MG tablet Take 81 mg by mouth daily.        . Calcium 500-125 MG-UNIT TABS Take  1 tablet by mouth daily.        Marland Kitchen conjugated estrogens (PREMARIN) vaginal cream Place vaginally as directed.        . Glucosamine-Chondroitin (OSTEO BI-FLEX REGULAR STRENGTH) 250-200 MG TABS Take 1 tablet by mouth daily.        . Multiple Vitamin (MULTIVITAMIN) tablet Take 1 tablet by mouth daily.        Marland Kitchen omeprazole (PRILOSEC) 20 MG capsule Take 20 mg by mouth every other day.             Review of Systems Review of Systems  Constitutional: Negative for fever, appetite change, fatigue and unexpected weight change.  Eyes: Negative for pain and visual disturbance.  Respiratory: Negative for cough and shortness of breath.   Cardiovascular: Negative for cp or palpitations    Gastrointestinal: Negative for nausea, diarrhea and constipation.  Genitourinary: Negative for urgency and frequency.  Skin: Negative for pallor and pos for occasional rash  Neurological: Negative for  weakness, light-headedness, numbness and headaches.  Hematological: Negative for adenopathy. Does not bruise/bleed easily.  Psychiatric/Behavioral: Negative for dysphoric mood. The patient is not nervous/anxious.          Objective:   Physical Exam  Constitutional: She appears well-developed and well-nourished. No distress.  HENT:  Head: Normocephalic and atraumatic.  Right Ear: External ear normal.  Left Ear: External ear normal.  Nose: Nose normal.  Mouth/Throat: Oropharynx is clear and moist.  Eyes: Conjunctivae and EOM are normal. Pupils are equal, round, and reactive to light. No scleral icterus.  Neck: Normal range of motion. Neck supple. No JVD present. Carotid bruit is not present. Erythema present. No thyromegaly present.  Cardiovascular: Normal rate, regular rhythm, normal heart sounds and intact distal pulses.   Pulmonary/Chest: Effort normal and breath sounds normal. No respiratory distress. She has no wheezes. She exhibits no tenderness.  Abdominal: Soft. Bowel sounds are normal. She exhibits no distension and no mass. There is no tenderness.  Genitourinary: No breast swelling, tenderness, discharge or bleeding.  Musculoskeletal: Normal range of motion. She exhibits no edema and no tenderness.  Lymphadenopathy:    She has no cervical adenopathy.  Neurological: She is alert. She has normal reflexes. No cranial nerve deficit. Coordination normal.  Skin: Skin is warm and dry. No rash noted. No erythema. No pallor.  Psychiatric: She has a normal mood and affect.          Assessment & Plan:

## 2011-06-27 NOTE — Assessment & Plan Note (Signed)
Pt is up to date on her colonoscopy with no changes

## 2011-06-27 NOTE — Assessment & Plan Note (Signed)
Schedule 2 y dexa Last one imp Good with ca and D D level today

## 2011-06-27 NOTE — Telephone Encounter (Signed)
Patient left with out having labwork done, I called her at home She refused the lab work,"said the doctor really didn't want it". I also asked if she scheduled her Bone Density, she said, " she would schedule herself at the [facility] office".

## 2011-06-27 NOTE — Telephone Encounter (Signed)
That is up to her of course Thanks for the heads up

## 2011-06-27 NOTE — Patient Instructions (Addendum)
Try small amount of sweet oil in ears for itching  We will schedule bone density test at check out  Will do a refil for acyclovir Talk to ladies at check out about switching to [place] office if you want to

## 2011-06-27 NOTE — Assessment & Plan Note (Signed)
Check lipids today Good diet  Good wt mt

## 2011-06-27 NOTE — Assessment & Plan Note (Signed)
Check cmet today Is followed closely Per pt her remaining kidney is larger No urinary symptoms

## 2011-08-03 ENCOUNTER — Ambulatory Visit (INDEPENDENT_AMBULATORY_CARE_PROVIDER_SITE_OTHER): Payer: Medicare Other | Admitting: Internal Medicine

## 2011-08-03 ENCOUNTER — Encounter: Payer: Self-pay | Admitting: Internal Medicine

## 2011-08-03 VITALS — BP 110/60 | HR 79 | Temp 98.0°F | Resp 14 | Ht 66.0 in | Wt 136.0 lb

## 2011-08-03 DIAGNOSIS — K219 Gastro-esophageal reflux disease without esophagitis: Secondary | ICD-10-CM

## 2011-08-03 DIAGNOSIS — Z853 Personal history of malignant neoplasm of breast: Secondary | ICD-10-CM

## 2011-08-03 DIAGNOSIS — H9209 Otalgia, unspecified ear: Secondary | ICD-10-CM

## 2011-08-03 DIAGNOSIS — N952 Postmenopausal atrophic vaginitis: Secondary | ICD-10-CM

## 2011-08-03 DIAGNOSIS — H9202 Otalgia, left ear: Secondary | ICD-10-CM

## 2011-08-03 LAB — CBC WITH DIFFERENTIAL/PLATELET
Basophils Relative: 0.6 % (ref 0.0–3.0)
Eosinophils Relative: 0.5 % (ref 0.0–5.0)
Hemoglobin: 13.7 g/dL (ref 12.0–15.0)
Lymphocytes Relative: 18.9 % (ref 12.0–46.0)
Monocytes Relative: 5.3 % (ref 3.0–12.0)
Neutrophils Relative %: 74.7 % (ref 43.0–77.0)
RBC: 4.51 Mil/uL (ref 3.87–5.11)
WBC: 8.1 10*3/uL (ref 4.5–10.5)

## 2011-08-03 LAB — COMPREHENSIVE METABOLIC PANEL
Albumin: 4.2 g/dL (ref 3.5–5.2)
BUN: 26 mg/dL — ABNORMAL HIGH (ref 6–23)
Calcium: 9 mg/dL (ref 8.4–10.5)
Chloride: 105 mEq/L (ref 96–112)
Creatinine, Ser: 1.5 mg/dL — ABNORMAL HIGH (ref 0.4–1.2)
Glucose, Bld: 117 mg/dL — ABNORMAL HIGH (ref 70–99)
Potassium: 3.9 mEq/L (ref 3.5–5.1)

## 2011-08-03 MED ORDER — DEXLANSOPRAZOLE 60 MG PO CPDR: 60.0000 mg | DELAYED_RELEASE_CAPSULE | Freq: Every day | ORAL | Status: AC

## 2011-08-03 MED ORDER — ESTROGENS, CONJUGATED 0.625 MG/GM VA CREA: TOPICAL_CREAM | VAGINAL | Status: DC

## 2011-08-03 NOTE — Progress Notes (Signed)
Subjective:    Patient ID: Mary Fields, female    DOB: 18-Jul-1941, 70 y.o.   MRN: [id number]  HPI Ms. Fuster is a 70 year old female with a history of breast cancer and renal cell cancer who presents to establish care. She was previously followed by Dr. Milinda Antis. Her concerns today include worsening of her acid reflux. She reports taking both omeprazole and Zantac with no improvement. She describes pain in her midepigastric area. She denies any nausea or vomiting. She denies any blood in her stool or black stool. She has been evaluated in the past by Dr. Mechele Collin for this.  She is also concerned about left ear pain. She reports that her left ear has been painful for the last week. She notes a possible injury during which she was cleaning her ear with a Q-tip and slipped. She denies any leakage of blood or other fluid from her ear canal. She denies any fever or chills. She denies any nasal congestion. She has a scheduled appointment with ear nose and throat next week.  Outpatient Encounter Prescriptions as of 08/03/2011  Medication Sig Dispense Refill  . acyclovir (ZOVIRAX) 400 MG tablet Take 400 mg by mouth as needed.       Marland Kitchen amoxicillin (AMOXIL) 500 MG capsule Take 500 mg by mouth. Take 4 pills orally at one time before dental procedure       . aspirin 81 MG tablet Take 81 mg by mouth daily.        . Calcium 500-125 MG-UNIT TABS Take 1 tablet by mouth daily.        Marland Kitchen conjugated estrogens (PREMARIN) vaginal cream Place vaginally as directed. Use twice a month   42.5 g  6  . Glucosamine-Chondroitin (OSTEO BI-FLEX REGULAR STRENGTH) 250-200 MG TABS Take 1 tablet by mouth daily.        . Multiple Vitamin (MULTIVITAMIN) tablet Take 1 tablet by mouth daily.        Marland Kitchen omeprazole (PRILOSEC) 20 MG capsule Take 20 mg by mouth every other day. As needed      . Propylene Glycol (SYSTANE BALANCE OP) 1 drop daily.       . ranitidine (ZANTAC) 300 MG capsule Take 300 mg by mouth as needed.       . valACYclovir  (VALTREX) 500 MG tablet Take 500 mg by mouth as needed.        Marland Kitchen DISCONTD: conjugated estrogens (PREMARIN) vaginal cream Place vaginally as directed. Use twice a month       . dexlansoprazole (DEXILANT) 60 MG capsule Take 1 capsule (60 mg total) by mouth daily.  30 capsule  6  . DISCONTD: Sod Fluoride-Potassium Nitrate (PREVIDENT 5000 SENSITIVE DT) Use as directed         Review of Systems  Constitutional: Negative for fever, chills, appetite change, fatigue and unexpected weight change.  HENT: Positive for ear pain. Negative for congestion, sore throat, trouble swallowing, neck pain, voice change and sinus pressure.   Eyes: Negative for visual disturbance.  Respiratory: Negative for cough, shortness of breath, wheezing and stridor.   Cardiovascular: Negative for chest pain, palpitations and leg swelling.  Gastrointestinal: Positive for abdominal pain. Negative for nausea, vomiting, diarrhea, constipation, blood in stool, abdominal distention and anal bleeding.  Genitourinary: Negative for dysuria and flank pain.  Musculoskeletal: Negative for myalgias, arthralgias and gait problem.  Skin: Negative for color change and rash.  Neurological: Negative for dizziness and headaches.  Hematological: Negative for adenopathy. Does not bruise/bleed easily.  Psychiatric/Behavioral: Negative for suicidal ideas, sleep disturbance and dysphoric mood. The patient is not nervous/anxious.    BP 110/60  Pulse 79  Temp(Src) 98 F (36.7 C) (Oral)  Resp 14  Ht 5\' 6"  (1.676 m)  Wt 136 lb (61.689 kg)  BMI 21.95 kg/m2  SpO2 96%     Objective:   Physical Exam  Constitutional: She is oriented to person, place, and time. She appears well-developed and well-nourished. No distress.  HENT:  Head: Normocephalic and atraumatic.  Right Ear: Tympanic membrane, external ear and ear canal normal. No middle ear effusion.  Left Ear: External ear and ear canal normal. Tympanic membrane is injected and erythematous. A  middle ear effusion is present.  Ears:  Nose: Nose normal.  Mouth/Throat: Oropharynx is clear and moist and mucous membranes are normal. No oropharyngeal exudate or posterior oropharyngeal erythema.  Eyes: Conjunctivae are normal. Pupils are equal, round, and reactive to light. Right eye exhibits no discharge. Left eye exhibits no discharge. No scleral icterus.  Neck: Normal range of motion. Neck supple. No tracheal deviation present. No thyromegaly present.  Cardiovascular: Normal rate, regular rhythm, normal heart sounds and intact distal pulses.  Exam reveals no gallop and no friction rub.   No murmur heard. Pulmonary/Chest: Effort normal and breath sounds normal. No respiratory distress. She has no wheezes. She has no rales. She exhibits no tenderness.  Abdominal: Soft. Bowel sounds are normal. She exhibits no distension and no mass. There is no tenderness. There is no rebound and no guarding.  Musculoskeletal: Normal range of motion. She exhibits no edema and no tenderness.  Lymphadenopathy:    She has no cervical adenopathy.  Neurological: She is alert and oriented to person, place, and time. No cranial nerve deficit. She exhibits normal muscle tone. Coordination normal.  Skin: Skin is warm and dry. No rash noted. She is not diaphoretic. No erythema. No pallor.  Psychiatric: She has a normal mood and affect. Her behavior is normal. Judgment and thought content normal.          Assessment & Plan:  1. GERD - worsening acid reflux despite use of omeprazole and Zantac. Will check for H. pylori. Will try changing to Dexilant. If testing for H. pylori is negative then will refer back to Dr. Mechele Collin for endoscopy. Patient will followup in one month.  2. Left ear pain -patient with left ear pain. It is difficult to see the anterior part of her tympanic membrane on exam because of cerumen. Question if she may have perforated her tympanic membrane. She will followup with ENT for this next  week.

## 2011-08-07 ENCOUNTER — Ambulatory Visit: Payer: Medicare Other | Admitting: Internal Medicine

## 2011-08-18 ENCOUNTER — Telehealth: Payer: Self-pay

## 2011-08-18 ENCOUNTER — Ambulatory Visit (INDEPENDENT_AMBULATORY_CARE_PROVIDER_SITE_OTHER)
Admission: RE | Admit: 2011-08-18 | Discharge: 2011-08-18 | Disposition: A | Discharge status: Home / Self Care | Payer: Medicare Other | Source: Ambulatory Visit

## 2011-08-18 DIAGNOSIS — M899 Disorder of bone, unspecified: Secondary | ICD-10-CM

## 2011-08-18 NOTE — Telephone Encounter (Signed)
Mary Fields from [facility] called pt is there for bone density and has had ht loss. Does Dr Milinda Antis want LVA. Dr Milinda Antis said if it is covered yes if not covered no. Mary Fields is not sure if covered so Dr Milinda Antis said no to LVA.

## 2011-08-23 ENCOUNTER — Encounter: Payer: Self-pay | Admitting: Family Medicine

## 2011-08-24 DIAGNOSIS — S72009A Fracture of unspecified part of neck of unspecified femur, initial encounter for closed fracture: Secondary | ICD-10-CM

## 2011-08-24 HISTORY — DX: Fracture of unspecified part of neck of unspecified femur, initial encounter for closed fracture: S72.009A

## 2011-08-24 HISTORY — PX: HIP FRACTURE SURGERY: SHX118

## 2011-09-04 ENCOUNTER — Ambulatory Visit: Payer: Medicare Other | Admitting: Internal Medicine

## 2011-09-04 ENCOUNTER — Telehealth: Payer: Self-pay | Admitting: *Deleted

## 2011-09-04 NOTE — Telephone Encounter (Signed)
Spoke with patient. Please reschedule her appointment to MID January and call her with the time.

## 2011-09-04 NOTE — Telephone Encounter (Signed)
Pt was scheduled for OV today but realized before checking in that she had not been rechecked for H.pylori to confirm successful treatment. She was also very confused regarding lab results. MD wanted pt to f/u after H.pylori breath test. Pt says that results were not explained at all. I sat down w/pt and discussed results of abnormal kidney labs and h.pylori. She also was upset that no one has called her about bone density result that were originally ordered by Dr Milinda Antis.   Plan: 1. Pt to go to [facility] today (or soon) for h.pylori test 2. Given copy of bone density results and explained that she needed to discuss her osteopenia in detail at f/u OV.  Pt left office with understanding that she is to f/u in the next week or two to discuss results in detail with Dr Dan Humphreys.

## 2011-09-07 ENCOUNTER — Telehealth: Payer: Self-pay | Admitting: Internal Medicine

## 2011-09-07 NOTE — Telephone Encounter (Signed)
Need to inform patient and also change apt per last phone note.

## 2011-09-07 NOTE — Telephone Encounter (Signed)
Breath test H. Pylori neg

## 2011-09-08 NOTE — Telephone Encounter (Signed)
LMOM to inform Pt results & to call back and speak w/scheduler to re-schedule to Jan 2013.

## 2011-09-10 ENCOUNTER — Inpatient Hospital Stay: Payer: Self-pay | Admitting: Orthopedic Surgery

## 2011-09-11 ENCOUNTER — Telehealth: Payer: Self-pay | Admitting: *Deleted

## 2011-09-11 NOTE — Telephone Encounter (Signed)
FYI: Patient called to inform you that she had a "bad fall at church yesterday and broke her hip" and is having surgery today. She is in Room 155 at [facility].

## 2011-09-12 NOTE — Telephone Encounter (Signed)
Yes, please reschedule to January

## 2011-09-12 NOTE — Telephone Encounter (Signed)
Dr. Dan Humphreys patient has an appointment for 11/27 at 9:45 with you, do I need to change that appointment until January?  Please advise.

## 2011-09-13 ENCOUNTER — Telehealth: Payer: Self-pay | Admitting: Family Medicine

## 2011-09-13 ENCOUNTER — Encounter: Payer: Self-pay | Admitting: Internal Medicine

## 2011-09-13 ENCOUNTER — Encounter: Payer: Self-pay | Admitting: Family Medicine

## 2011-09-13 NOTE — Telephone Encounter (Signed)
Patients friend called to let you know that Ms Probus fell at church last Sunday and broke her hip. She had Hip surgery at [facility] by Dr Real Cons and then will have to go to possible [facility] for Rehab. Patient wanted you to know.

## 2011-09-13 NOTE — Telephone Encounter (Signed)
Thanks for the update - I noted this in her chart

## 2011-09-15 LAB — PATHOLOGY REPORT

## 2011-09-19 ENCOUNTER — Ambulatory Visit: Payer: Medicare Other | Admitting: Internal Medicine

## 2011-09-28 ENCOUNTER — Ambulatory Visit: Payer: Self-pay | Admitting: Urology

## 2011-10-04 ENCOUNTER — Telehealth: Payer: Self-pay | Admitting: *Deleted

## 2011-10-04 NOTE — Telephone Encounter (Signed)
1. She is wearing Ted hose 24/7 - when can she remove these? Or have a break from them to shower? (she can not put them on by herself) Surgeon told her to call her PCP for advisement.   - I would wear them during the day, but take off at night and remove for shower.  2. She is taking asp 325 mg daily and wants to know when to resume 81 mg daily. Again, surgeon advised her to ask PCP.   I would continue 325mg  daily for now, until seen in follow up.  3. She will not be cleared to drive until 40/98. Next surgeon's apt is Jan 3 rd, when should she f/u here?   How about mid January

## 2011-10-04 NOTE — Telephone Encounter (Signed)
Discussed w/pt. She can not remove or put back on the ted hose by herself and only has limited home health assistance. I will call Alcario Drought (pts [facility] RN) [phone] to discuss possibility of more inhome assistance.

## 2011-10-04 NOTE — Telephone Encounter (Signed)
Spoke w/pt - she had surgery to repair fractured hip 11/19, currently is at home w/[facility]. She has a couple questions.  1. She is wearing Ted hose 24/7 - when can she remove these? Or have a break from them to shower? (she can not put them on by herself) Surgeon told her to call her PCP for advisement. 2. She is taking asp 325 mg daily and wants to know when to resume 81 mg daily. Again, surgeon advised her to ask PCP. 3. She will not be cleared to drive until 16/10. Next surgeon's apt is Jan 3 rd, when should she f/u here?

## 2011-10-11 NOTE — Telephone Encounter (Signed)
Spoke w/RN, she is currently in process of trying to get in home aid but insurance has not approved yet. I advised her that we would help if needed. RN asked if she could have orders for alt option to ted hose. I advised her to call the surgeon to get recommendations regarding this.

## 2011-10-27 ENCOUNTER — Encounter: Payer: Self-pay | Admitting: Orthopedic Surgery

## 2011-11-08 ENCOUNTER — Ambulatory Visit (INDEPENDENT_AMBULATORY_CARE_PROVIDER_SITE_OTHER): Payer: Medicare Other | Admitting: Internal Medicine

## 2011-11-08 ENCOUNTER — Encounter: Payer: Self-pay | Admitting: Internal Medicine

## 2011-11-08 VITALS — BP 128/66 | HR 73 | Temp 98.4°F | Ht 66.0 in | Wt 131.0 lb

## 2011-11-08 DIAGNOSIS — Z1239 Encounter for other screening for malignant neoplasm of breast: Secondary | ICD-10-CM

## 2011-11-08 DIAGNOSIS — R269 Unspecified abnormalities of gait and mobility: Secondary | ICD-10-CM

## 2011-11-08 DIAGNOSIS — K219 Gastro-esophageal reflux disease without esophagitis: Secondary | ICD-10-CM

## 2011-11-08 NOTE — Progress Notes (Signed)
Subjective:    Patient ID: Mary Fields, female    DOB: 11-08-1940, 71 y.o.   MRN: [id number]  HPI 71 year old female with a history of GERD and recent history of left hip replacement presents for followup. She reports that she's been doing well. She notes that she has been working with a physical therapist in an effort to improve strength in her left leg. She notes minimal pain in her left leg but has noticed some discomfort in her right hip. She is status post right hip replacement as well. She notes some misalignment in her gait and is working on gait stability with her physical therapist. She denies any recent falls.   At her last visit, she had complained of worsening acid reflux symptoms including epigastric abdominal pain. She was tested for H. pylori and this was negative. She notes that over the last month her symptoms have resolved. She continues to take omeprazole on a daily basis.  Outpatient Encounter Prescriptions as of 11/08/2011  Medication Sig Dispense Refill  . acyclovir (ZOVIRAX) 400 MG tablet Take 400 mg by mouth as needed.       Marland Kitchen amoxicillin (AMOXIL) 500 MG capsule Take 500 mg by mouth. Take 4 pills orally at one time before dental procedure       . aspirin 81 MG tablet Take 81 mg by mouth daily.        . Calcium 500-125 MG-UNIT TABS Take 1 tablet by mouth daily.        Marland Kitchen conjugated estrogens (PREMARIN) vaginal cream Place vaginally as directed. Use twice a month   42.5 g  6  . Glucosamine-Chondroitin (OSTEO BI-FLEX REGULAR STRENGTH) 250-200 MG TABS Take 1 tablet by mouth daily.        . Multiple Vitamin (MULTIVITAMIN) tablet Take 1 tablet by mouth daily.        Marland Kitchen omeprazole (PRILOSEC) 20 MG capsule Take 20 mg by mouth every other day. As needed      . Propylene Glycol (SYSTANE BALANCE OP) 1 drop daily.       . ranitidine (ZANTAC) 300 MG capsule Take 300 mg by mouth as needed.       . valACYclovir (VALTREX) 500 MG tablet Take 500 mg by mouth as needed.          Review  of Systems  Constitutional: Negative for fever, chills, appetite change, fatigue and unexpected weight change.  HENT: Negative for ear pain, congestion, sore throat, trouble swallowing, neck pain, voice change and sinus pressure.   Eyes: Negative for visual disturbance.  Respiratory: Negative for cough, shortness of breath, wheezing and stridor.   Cardiovascular: Negative for chest pain, palpitations and leg swelling.  Gastrointestinal: Negative for nausea, vomiting, abdominal pain, diarrhea, constipation, blood in stool, abdominal distention and anal bleeding.  Genitourinary: Negative for dysuria and flank pain.  Musculoskeletal: Positive for myalgias, arthralgias and gait problem.  Skin: Negative for color change and rash.  Neurological: Positive for weakness. Negative for dizziness and headaches.  Hematological: Negative for adenopathy. Does not bruise/bleed easily.  Psychiatric/Behavioral: Negative for suicidal ideas, sleep disturbance and dysphoric mood. The patient is not nervous/anxious.    BP 128/66  Pulse 73  Temp(Src) 98.4 F (36.9 C) (Oral)  Ht 5\' 6"  (1.676 m)  Wt 131 lb (59.421 kg)  BMI 21.14 kg/m2  SpO2 96%     Objective:   Physical Exam  Constitutional: She is oriented to person, place, and time. She appears well-developed and well-nourished. No distress.  HENT:  Head: Normocephalic and atraumatic.  Right Ear: External ear normal.  Left Ear: External ear normal.  Nose: Nose normal.  Mouth/Throat: Oropharynx is clear and moist. No oropharyngeal exudate.  Eyes: Conjunctivae are normal. Pupils are equal, round, and reactive to light. Right eye exhibits no discharge. Left eye exhibits no discharge. No scleral icterus.  Neck: Normal range of motion. Neck supple. No tracheal deviation present. No thyromegaly present.  Cardiovascular: Normal rate, regular rhythm, normal heart sounds and intact distal pulses.  Exam reveals no gallop and no friction rub.   No murmur  heard. Pulmonary/Chest: Effort normal and breath sounds normal. No respiratory distress. She has no wheezes. She has no rales. She exhibits no tenderness.  Musculoskeletal: Normal range of motion. She exhibits no edema and no tenderness.  Lymphadenopathy:    She has no cervical adenopathy.  Neurological: She is alert and oriented to person, place, and time. No cranial nerve deficit. She exhibits normal muscle tone. Coordination normal.  Skin: Skin is warm and dry. No rash noted. She is not diaphoretic. No erythema. No pallor.  Psychiatric: She has a normal mood and affect. Her behavior is normal. Judgment and thought content normal.          Assessment & Plan:  1. GERD - Symptoms have resolved. Pt will continue omeprazole daily. Follow up prn.  2. Gait instability - Pt continues with PT after left hip replacement surgery. Still noticing some gait misalignment or instability.  No falls. Will continue to work with PT and follow up in 6 months.

## 2011-11-24 ENCOUNTER — Encounter: Payer: Self-pay | Admitting: Orthopedic Surgery

## 2011-12-21 ENCOUNTER — Ambulatory Visit: Payer: Self-pay | Admitting: Internal Medicine

## 2011-12-26 ENCOUNTER — Ambulatory Visit: Payer: Self-pay | Admitting: Orthopedic Surgery

## 2012-01-01 ENCOUNTER — Encounter: Payer: Self-pay | Admitting: Internal Medicine

## 2012-01-04 ENCOUNTER — Encounter: Payer: Self-pay | Admitting: Orthopedic Surgery

## 2012-01-22 ENCOUNTER — Encounter: Payer: Self-pay | Admitting: Orthopedic Surgery

## 2012-02-07 ENCOUNTER — Ambulatory Visit (INDEPENDENT_AMBULATORY_CARE_PROVIDER_SITE_OTHER): Payer: Medicare Other | Admitting: Internal Medicine

## 2012-02-07 ENCOUNTER — Encounter: Payer: Self-pay | Admitting: Internal Medicine

## 2012-02-07 VITALS — BP 126/70 | HR 98 | Temp 98.7°F | Ht 66.0 in | Wt 131.0 lb

## 2012-02-07 DIAGNOSIS — M549 Dorsalgia, unspecified: Secondary | ICD-10-CM

## 2012-02-07 DIAGNOSIS — J40 Bronchitis, not specified as acute or chronic: Secondary | ICD-10-CM

## 2012-02-07 MED ORDER — PREDNISONE (PAK) 10 MG PO TABS: ORAL_TABLET | ORAL | Status: DC

## 2012-02-07 MED ORDER — AZITHROMYCIN 250 MG PO TABS: ORAL_TABLET | ORAL | Status: AC

## 2012-02-07 NOTE — Assessment & Plan Note (Signed)
Will get records on evaluation from orthopedic surgeon. Patient will continue with physical therapy.

## 2012-02-07 NOTE — Progress Notes (Signed)
Subjective:    Patient ID: Mary Fields, female    DOB: August 19, 1941, 71 y.o.   MRN: [id number]  HPI 71 year old female with history of degenerative arthritis and seasonal allergies presents for followup. In regards to her arthritis, she reports that she was recently seen by her orthopedic surgeon and had plain x-rays performed of her back. She was told that she is not a candidate for surgical intervention at this time. It was recommended that she start with physical therapy to help improve her chronic back pain. She notes that she recently started physical therapy but has not noticed an improvement yet.  She is primarily concerned today about recent worsening of allergies symptoms. She notes over the last 1-2 weeks she has had increased clear nasal drainage, cough productive of purulent sputum, increased wheezing and shortness of breath. She has tried using over-the-counter nonsedating antihistamines, decongestants, and Mucinex with no improvement. She denies any fever or chills. She denies any chest pain.  Outpatient Encounter Prescriptions as of 02/07/2012  Medication Sig Dispense Refill  . acyclovir (ZOVIRAX) 400 MG tablet Take 400 mg by mouth as needed.       Marland Kitchen amoxicillin (AMOXIL) 500 MG capsule Take 500 mg by mouth. Take 4 pills orally at one time before dental procedure       . aspirin 81 MG tablet Take 81 mg by mouth daily.        . Calcium 500-125 MG-UNIT TABS Take 1 tablet by mouth daily.        Marland Kitchen conjugated estrogens (PREMARIN) vaginal cream Place vaginally as directed. Use twice a month   42.5 g  6  . Glucosamine-Chondroitin (OSTEO BI-FLEX REGULAR STRENGTH) 250-200 MG TABS Take 1 tablet by mouth daily.        . Multiple Vitamin (MULTIVITAMIN) tablet Take 1 tablet by mouth daily.        Marland Kitchen omeprazole (PRILOSEC) 20 MG capsule Take 20 mg by mouth every other day. As needed      . Propylene Glycol (SYSTANE BALANCE OP) 1 drop daily.       . ranitidine (ZANTAC) 300 MG capsule Take 300 mg by  mouth as needed.       . valACYclovir (VALTREX) 500 MG tablet Take 500 mg by mouth as needed.        Marland Kitchen azithromycin (ZITHROMAX Z-PAK) 250 MG tablet Take 2 pills day 1, then 1 pill daily days 2-5  6 each  0  . predniSONE (STERAPRED UNI-PAK) 10 MG tablet Take 60mg  day 1 then taper by 10mg  daily  21 tablet  0    Review of Systems  Constitutional: Negative for fever, chills, appetite change, fatigue and unexpected weight change.  HENT: Positive for congestion, rhinorrhea, sneezing, postnasal drip and sinus pressure. Negative for ear pain, sore throat, trouble swallowing, neck pain and voice change.   Eyes: Negative for visual disturbance.  Respiratory: Positive for cough, shortness of breath and wheezing. Negative for stridor.   Cardiovascular: Negative for chest pain, palpitations and leg swelling.  Gastrointestinal: Negative for nausea, vomiting, abdominal pain, diarrhea, constipation, blood in stool, abdominal distention and anal bleeding.  Genitourinary: Negative for dysuria and flank pain.  Musculoskeletal: Positive for myalgias, back pain and arthralgias. Negative for gait problem.  Skin: Negative for color change and rash.  Neurological: Negative for dizziness and headaches.  Hematological: Negative for adenopathy. Does not bruise/bleed easily.  Psychiatric/Behavioral: Negative for suicidal ideas, sleep disturbance and dysphoric mood. The patient is not nervous/anxious.  BP 126/70  Pulse 98  Temp(Src) 98.7 F (37.1 C) (Oral)  Ht 5\' 6"  (1.676 m)  Wt 131 lb (59.421 kg)  BMI 21.14 kg/m2  SpO2 96%     Objective:   Physical Exam  Constitutional: She is oriented to person, place, and time. She appears well-developed and well-nourished. No distress.  HENT:  Head: Normocephalic and atraumatic.  Right Ear: External ear normal.  Left Ear: External ear normal.  Nose: Nose normal.  Mouth/Throat: Oropharynx is clear and moist. No oropharyngeal exudate.  Eyes: Conjunctivae are normal.  Pupils are equal, round, and reactive to light. Right eye exhibits no discharge. Left eye exhibits no discharge. No scleral icterus.  Neck: Normal range of motion. Neck supple. No tracheal deviation present. No thyromegaly present.  Cardiovascular: Normal rate, regular rhythm, normal heart sounds and intact distal pulses.  Exam reveals no gallop and no friction rub.   No murmur heard. Pulmonary/Chest: Effort normal. No accessory muscle usage. Not tachypneic. No respiratory distress. She has no decreased breath sounds. She has no wheezes. She has rhonchi in the right lower field. She has no rales. She exhibits no tenderness.  Musculoskeletal: Normal range of motion. She exhibits no edema and no tenderness.  Lymphadenopathy:    She has no cervical adenopathy.  Neurological: She is alert and oriented to person, place, and time. No cranial nerve deficit. She exhibits normal muscle tone. Coordination normal.  Skin: Skin is warm and dry. No rash noted. She is not diaphoretic. No erythema. No pallor.  Psychiatric: She has a normal mood and affect. Her behavior is normal. Judgment and thought content normal.          Assessment & Plan:

## 2012-02-07 NOTE — Assessment & Plan Note (Signed)
Symptoms and exam are most consistent with acute bronchitis. Will treat with prednisone taper and azithromycin. Patient will continue nonsedating antihistamine. Patient will call or return to clinic if symptoms are not improving over the next 48 hours.

## 2012-02-15 ENCOUNTER — Encounter: Payer: Self-pay | Admitting: Internal Medicine

## 2012-02-15 ENCOUNTER — Ambulatory Visit (INDEPENDENT_AMBULATORY_CARE_PROVIDER_SITE_OTHER): Payer: Medicare Other | Admitting: Internal Medicine

## 2012-02-15 DIAGNOSIS — J40 Bronchitis, not specified as acute or chronic: Secondary | ICD-10-CM

## 2012-02-15 DIAGNOSIS — J309 Allergic rhinitis, unspecified: Secondary | ICD-10-CM | POA: Insufficient documentation

## 2012-02-15 DIAGNOSIS — H6690 Otitis media, unspecified, unspecified ear: Secondary | ICD-10-CM

## 2012-02-15 DIAGNOSIS — H6691 Otitis media, unspecified, right ear: Secondary | ICD-10-CM | POA: Insufficient documentation

## 2012-02-15 MED ORDER — AMOXICILLIN-POT CLAVULANATE 875-125 MG PO TABS: 1.0000 | ORAL_TABLET | Freq: Two times a day (BID) | ORAL | Status: AC

## 2012-02-15 MED ORDER — IPRATROPIUM BROMIDE 0.03 % NA SOLN: 2.0000 | Freq: Two times a day (BID) | NASAL | Status: DC

## 2012-02-15 NOTE — Assessment & Plan Note (Signed)
Will use Claritin or Zyrtec-D to see if any improvement in symptoms. Will restart Nasonex and use Atrovent nasal spray as needed. Follow up prn.

## 2012-02-15 NOTE — Progress Notes (Signed)
Subjective:    Patient ID: Mary Fields, female    DOB: Aug 16, 1941, 71 y.o.   MRN: [id number]  HPI 71 year old female with recent history of bronchitis presents for followup. She reports that her cough and shortness of breath have improved. She still has a rare nonproductive cough. She continues to have clear nasal drainage, particularly from her right nostril. She occasionally has sneezing. She has been taking Zyrtec or Allegra with minimal improvement. She is concerned because she is scheduled to fly to [facility] for a trip next week. She reports difficulty in the past with ear pain after flying. She denies any fever or chills. She denies any other concerns today.  Outpatient Encounter Prescriptions as of 02/15/2012  Medication Sig Dispense Refill  . acyclovir (ZOVIRAX) 400 MG tablet Take 400 mg by mouth as needed.       Marland Kitchen amoxicillin (AMOXIL) 500 MG capsule Take 500 mg by mouth. Take 4 pills orally at one time before dental procedure       . aspirin 81 MG tablet Take 81 mg by mouth daily.        Marland Kitchen conjugated estrogens (PREMARIN) vaginal cream Place vaginally as directed. Use twice a month   42.5 g  6  . Glucosamine-Chondroitin (OSTEO BI-FLEX REGULAR STRENGTH) 250-200 MG TABS Take 1 tablet by mouth daily.        . Multiple Vitamin (MULTIVITAMIN) tablet Take 1 tablet by mouth daily.        Marland Kitchen omeprazole (PRILOSEC) 20 MG capsule Take 20 mg by mouth every other day. As needed      . Propylene Glycol (SYSTANE BALANCE OP) 1 drop daily.       . ranitidine (ZANTAC) 300 MG capsule Take 300 mg by mouth as needed.       . valACYclovir (VALTREX) 500 MG tablet Take 500 mg by mouth as needed.        Marland Kitchen amoxicillin-clavulanate (AUGMENTIN) 875-125 MG per tablet Take 1 tablet by mouth 2 (two) times daily.  20 tablet  0  . Calcium 500-125 MG-UNIT TABS Take 1 tablet by mouth daily.        Marland Kitchen ipratropium (ATROVENT) 0.03 % nasal spray Place 2 sprays into the nose every 12 (twelve) hours.  30 mL  12  . DISCONTD:  predniSONE (STERAPRED UNI-PAK) 10 MG tablet Take 60mg  day 1 then taper by 10mg  daily  21 tablet  0    Review of Systems  Constitutional: Negative for fever, chills, appetite change, fatigue and unexpected weight change.  HENT: Positive for ear pain, rhinorrhea and postnasal drip. Negative for congestion, sore throat, trouble swallowing, neck pain, voice change and sinus pressure.   Eyes: Negative for visual disturbance.  Respiratory: Positive for cough (rare). Negative for shortness of breath, wheezing and stridor.   Cardiovascular: Negative for chest pain, palpitations and leg swelling.  Gastrointestinal: Negative for nausea, vomiting, abdominal pain, diarrhea, constipation, blood in stool, abdominal distention and anal bleeding.  Genitourinary: Negative for dysuria and flank pain.  Musculoskeletal: Negative for myalgias, arthralgias and gait problem.  Skin: Negative for color change and rash.  Neurological: Negative for dizziness and headaches.  Hematological: Negative for adenopathy. Does not bruise/bleed easily.  Psychiatric/Behavioral: Negative for suicidal ideas, sleep disturbance and dysphoric mood. The patient is not nervous/anxious.    BP 105/68  Pulse 76  Temp(Src) 98 F (36.7 C) (Oral)  Ht 5\' 6"  (1.676 m)  Wt 136 lb (61.689 kg)  BMI 21.95 kg/m2     Objective:  Physical Exam  Constitutional: She is oriented to person, place, and time. She appears well-developed and well-nourished. No distress.  HENT:  Head: Normocephalic and atraumatic.  Right Ear: External ear normal. Tympanic membrane is not erythematous and not bulging. A middle ear effusion is present.  Left Ear: External ear normal. Tympanic membrane is not erythematous and not bulging.  No middle ear effusion.  Nose: Nose normal.  Mouth/Throat: Oropharynx is clear and moist. No oropharyngeal exudate.  Eyes: Conjunctivae are normal. Pupils are equal, round, and reactive to light. Right eye exhibits no discharge.  Left eye exhibits no discharge. No scleral icterus.  Neck: Normal range of motion. Neck supple. No tracheal deviation present. No thyromegaly present.  Cardiovascular: Normal rate, regular rhythm, normal heart sounds and intact distal pulses.  Exam reveals no gallop and no friction rub.   No murmur heard. Pulmonary/Chest: Effort normal and breath sounds normal. No respiratory distress. She has no wheezes. She has no rales. She exhibits no tenderness.  Musculoskeletal: Normal range of motion. She exhibits no edema and no tenderness.  Lymphadenopathy:    She has no cervical adenopathy.  Neurological: She is alert and oriented to person, place, and time. No cranial nerve deficit. She exhibits normal muscle tone. Coordination normal.  Skin: Skin is warm and dry. No rash noted. She is not diaphoretic. No erythema. No pallor.  Psychiatric: She has a normal mood and affect. Her behavior is normal. Judgment and thought content normal.          Assessment & Plan:

## 2012-02-15 NOTE — Assessment & Plan Note (Signed)
Middle ear effusion noted on right. Will write for augmentin for pt to take with her on upcoming trip, with plan to start if symptoms of right ear pain/pressure persistent or if fever develops. Will also try using antihistamine and nasonex as above.

## 2012-02-15 NOTE — Assessment & Plan Note (Signed)
Symptoms resolved. Lung exam normal today. Will continue to monitor.

## 2012-02-20 ENCOUNTER — Telehealth: Payer: Self-pay | Admitting: *Deleted

## 2012-02-20 NOTE — Telephone Encounter (Signed)
Pt just called to let you know that she was given the generic for the Nasal Spray prescribed and the cost was only $6.00 w/Medicare/SLS

## 2012-02-21 ENCOUNTER — Encounter: Payer: Self-pay | Admitting: Orthopedic Surgery

## 2012-05-07 ENCOUNTER — Ambulatory Visit (INDEPENDENT_AMBULATORY_CARE_PROVIDER_SITE_OTHER): Payer: Medicare Other | Admitting: Internal Medicine

## 2012-05-07 ENCOUNTER — Encounter: Payer: Self-pay | Admitting: Internal Medicine

## 2012-05-07 VITALS — BP 124/78 | HR 71 | Temp 98.1°F | Ht 66.0 in | Wt 131.8 lb

## 2012-05-07 DIAGNOSIS — Z0000 Encounter for general adult medical examination without abnormal findings: Secondary | ICD-10-CM

## 2012-05-07 DIAGNOSIS — N952 Postmenopausal atrophic vaginitis: Secondary | ICD-10-CM

## 2012-05-07 DIAGNOSIS — K219 Gastro-esophageal reflux disease without esophagitis: Secondary | ICD-10-CM | POA: Insufficient documentation

## 2012-05-07 MED ORDER — PANTOPRAZOLE SODIUM 40 MG PO TBEC: 40.0000 mg | DELAYED_RELEASE_TABLET | Freq: Every day | ORAL | Status: DC

## 2012-05-07 NOTE — Assessment & Plan Note (Signed)
Symptoms of GERD are controlled with omeprazole, however patient has cramping and diarrhea with this medication. Will try changing to pantoprazole. Patient will e-mail or call if symptoms are persistent with pantoprazole.

## 2012-05-07 NOTE — Progress Notes (Signed)
Subjective:    Patient ID: Mary Fields, female    DOB: 1941-06-27, 71 y.o.   MRN: [id number]  HPI 71 year old female presents for followup. Her primary concern today is persistent GERD symptoms. She reports she is using omeprazole every other day with improvement in symptoms but she is unable to take omeprazole on a regular basis because this medication causes some abdominal cramping and diarrhea. She questions whether another medication might be better tolerated. Her symptoms of GERD include epigastric pain and reflux of food particles in her throat.  She reports that symptoms of allergic rhinitis and bronchitis which were present at her last visit have completely resolved.  Outpatient Encounter Prescriptions as of 05/07/2012  Medication Sig Dispense Refill  . acyclovir (ZOVIRAX) 400 MG tablet Take 400 mg by mouth as needed.       Marland Kitchen amoxicillin (AMOXIL) 500 MG capsule Take 500 mg by mouth. Take 4 pills orally at one time before dental procedure       . aspirin 81 MG tablet Take 81 mg by mouth daily.        . Calcium 500-125 MG-UNIT TABS Take 1 tablet by mouth daily.        Marland Kitchen conjugated estrogens (PREMARIN) vaginal cream Place vaginally as directed. Use twice a month   42.5 g  6  . Glucosamine-Chondroitin (OSTEO BI-FLEX REGULAR STRENGTH) 250-200 MG TABS Take 1 tablet by mouth daily.        Marland Kitchen ipratropium (ATROVENT) 0.03 % nasal spray Place 2 sprays into the nose every 12 (twelve) hours.  30 mL  12  . Multiple Vitamin (MULTIVITAMIN) tablet Take 1 tablet by mouth daily.        Marland Kitchen omeprazole (PRILOSEC) 20 MG capsule Take 20 mg by mouth every other day. As needed      . Propylene Glycol (SYSTANE BALANCE OP) 1 drop daily.       . ranitidine (ZANTAC) 300 MG capsule Take 300 mg by mouth as needed.       . valACYclovir (VALTREX) 500 MG tablet Take 500 mg by mouth as needed.        . pantoprazole (PROTONIX) 40 MG tablet Take 1 tablet (40 mg total) by mouth daily.  30 tablet  3   BP 124/78  Pulse 71   Temp 98.1 F (36.7 C) (Oral)  Ht 5\' 6"  (1.676 m)  Wt 131 lb 12 oz (59.761 kg)  BMI 21.26 kg/m2  SpO2 95%  Review of Systems  Constitutional: Negative for fever, chills, appetite change, fatigue and unexpected weight change.  HENT: Negative for ear pain, congestion, sore throat, trouble swallowing, neck pain, voice change and sinus pressure.   Eyes: Negative for visual disturbance.  Respiratory: Negative for cough, shortness of breath, wheezing and stridor.   Cardiovascular: Negative for chest pain, palpitations and leg swelling.  Gastrointestinal: Positive for abdominal pain. Negative for nausea, vomiting, diarrhea, constipation, blood in stool, abdominal distention and anal bleeding.  Genitourinary: Negative for dysuria and flank pain.  Musculoskeletal: Negative for myalgias, arthralgias and gait problem.  Skin: Negative for color change and rash.  Neurological: Negative for dizziness and headaches.  Hematological: Negative for adenopathy. Does not bruise/bleed easily.  Psychiatric/Behavioral: Negative for suicidal ideas, disturbed wake/sleep cycle and dysphoric mood. The patient is not nervous/anxious.        Objective:   Physical Exam  Constitutional: She is oriented to person, place, and time. She appears well-developed and well-nourished. No distress.  HENT:  Head: Normocephalic and atraumatic.  Right Ear: External ear normal.  Left Ear: External ear normal.  Nose: Nose normal.  Mouth/Throat: Oropharynx is clear and moist. No oropharyngeal exudate.  Eyes: Conjunctivae are normal. Pupils are equal, round, and reactive to light. Right eye exhibits no discharge. Left eye exhibits no discharge. No scleral icterus.  Neck: Normal range of motion. Neck supple. No tracheal deviation present. No thyromegaly present.  Cardiovascular: Normal rate, regular rhythm, normal heart sounds and intact distal pulses.  Exam reveals no gallop and no friction rub.   No murmur  heard. Pulmonary/Chest: Effort normal and breath sounds normal. No respiratory distress. She has no wheezes. She has no rales. She exhibits no tenderness.  Musculoskeletal: Normal range of motion. She exhibits no edema and no tenderness.  Lymphadenopathy:    She has no cervical adenopathy.  Neurological: She is alert and oriented to person, place, and time. No cranial nerve deficit. She exhibits normal muscle tone. Coordination normal.  Skin: Skin is warm and dry. No rash noted. She is not diaphoretic. No erythema. No pallor.  Psychiatric: She has a normal mood and affect. Her behavior is normal. Judgment and thought content normal.          Assessment & Plan:

## 2012-06-03 ENCOUNTER — Other Ambulatory Visit: Payer: Self-pay | Admitting: Internal Medicine

## 2012-06-12 ENCOUNTER — Encounter: Payer: Medicare Other | Admitting: Internal Medicine

## 2012-08-02 ENCOUNTER — Other Ambulatory Visit: Payer: Self-pay | Admitting: Internal Medicine

## 2012-08-02 ENCOUNTER — Other Ambulatory Visit: Payer: Medicare Other

## 2012-08-02 DIAGNOSIS — Z0000 Encounter for general adult medical examination without abnormal findings: Secondary | ICD-10-CM

## 2012-08-05 LAB — COMPREHENSIVE METABOLIC PANEL
ALT: 11 IU/L (ref 0–32)
AST: 19 IU/L (ref 0–40)
Albumin/Globulin Ratio: 1.6 (ref 1.1–2.5)
Calcium: 9.1 mg/dL (ref 8.6–10.2)
GFR calc Af Amer: 67 mL/min/{1.73_m2} (ref >59)
GFR calc non Af Amer: 58 mL/min/{1.73_m2} — ABNORMAL LOW (ref >59)
Potassium: 4.1 mmol/L (ref 3.5–5.2)
Sodium: 139 mmol/L (ref 134–144)
Total Bilirubin: 0.4 mg/dL (ref 0.0–1.2)

## 2012-08-06 ENCOUNTER — Encounter: Payer: Self-pay | Admitting: *Deleted

## 2012-08-08 ENCOUNTER — Encounter: Payer: Self-pay | Admitting: Internal Medicine

## 2012-08-08 ENCOUNTER — Ambulatory Visit (INDEPENDENT_AMBULATORY_CARE_PROVIDER_SITE_OTHER): Payer: Medicare Other | Admitting: Internal Medicine

## 2012-08-08 VITALS — BP 130/80 | HR 68 | Temp 98.2°F | Ht 66.0 in | Wt 134.2 lb

## 2012-08-08 DIAGNOSIS — Z1239 Encounter for other screening for malignant neoplasm of breast: Secondary | ICD-10-CM | POA: Insufficient documentation

## 2012-08-08 DIAGNOSIS — Z0000 Encounter for general adult medical examination without abnormal findings: Secondary | ICD-10-CM | POA: Insufficient documentation

## 2012-08-08 DIAGNOSIS — N952 Postmenopausal atrophic vaginitis: Secondary | ICD-10-CM | POA: Insufficient documentation

## 2012-08-08 MED ORDER — ESTROGENS, CONJUGATED 0.625 MG/GM VA CREA: TOPICAL_CREAM | VAGINAL | Status: DC

## 2012-08-08 NOTE — Assessment & Plan Note (Signed)
Mammogram performed in February 2013 was normal. However, patient had extreme pain with this exam and prefers to defer further mammograms. We'll plan to get breast ultrasound for screening in the future.

## 2012-08-08 NOTE — Assessment & Plan Note (Signed)
General medical exam including breast exam is normal today. Pap is deferred because patient is status post hysterectomy. Mammogram is up-to-date. Patient declines further mammograms because of extreme pain with recent mammogram. Will plan to set up breast ultrasound in the future. Colonoscopy is up-to-date. Labwork is up-to-date and was reviewed today including kidney, liver function and lipids. Will add thyroid function test because of recent temperature intolerance. Vaccinations are up-to-date. Followup in one year or sooner as needed.

## 2012-08-08 NOTE — Assessment & Plan Note (Signed)
Patient is currently using Premarin cream but notes some vaginal discharge with this medication. We discussed alternatives including selective estrogen receptor modulators She was not able to tolerate tamoxifen in the past and prefers not to use these medications. We also discussed oral Premarin however given her history of breast cancer this is not an acceptable option for her. She will try OTC Replense. Follow up prn.

## 2012-08-08 NOTE — Progress Notes (Signed)
Subjective:    Patient ID: Mary Fields, female    DOB: 1941/02/03, 71 y.o.   MRN: [id number]  HPI The patient is here for annual Medicare wellness examination and management of other chronic and acute problems.   The risk factors are reflected in the social history.  The roster of all physicians providing medical care to patient - is listed in the Snapshot section of the chart.  Activities of daily living:  The patient is 100% independent in all ADLs: dressing, toileting, feeding as well as independent mobility  Home safety : The patient has smoke detectors in the home. They wear seatbelts.  There are no firearms at home. There is no violence in the home.   There is no risks for hepatitis, STDs or HIV. There is no history of blood transfusion. They have no travel history to infectious disease endemic areas of the world.  The patient has seen their dentist in the last six month. (Dr. Elissa Hefty) They have seen their eye doctor in the last year. (Dr. Dorcas Mcmurray) Hearing was checked several years ago. Normal. No issues with hearing. They have deferred audiologic testing in the last year.    They do not  have excessive sun exposure. Discussed the need for sun protection: hats, long sleeves and use of sunscreen if there is significant sun exposure.   Diet: the importance of a healthy diet is discussed. They do have a relatively healthy diet.  The benefits of regular aerobic exercise were discussed. Walks dogs daily.  Depression screen: there are no signs or vegative symptoms of depression- irritability, change in appetite, anhedonia, sadness/tearfullness.  Cognitive assessment: the patient manages all their financial and personal affairs and is actively engaged. They could relate day,date,year and events.  The following portions of the patient's history were reviewed and updated as appropriate: allergies, current medications, past family history, past medical history,  past surgical history,  past social history  and problem list.  Visual acuity was not assessed per patient preference since she has regular follow up with her ophthalmologist. Hearing and body mass index were assessed and reviewed.   During the course of the visit the patient was educated and counseled about appropriate screening and preventive services including : fall prevention , diabetes screening, nutrition counseling, colorectal cancer screening, and recommended immunizations.     Outpatient Encounter Prescriptions as of 08/08/2012  Medication Sig Dispense Refill  . aspirin 81 MG tablet Take 81 mg by mouth daily.        Marland Kitchen conjugated estrogens (PREMARIN) vaginal cream Place vaginally as directed. Use twice a month  30 g  6  . Glucosamine-Chondroitin (OSTEO BI-FLEX REGULAR STRENGTH) 250-200 MG TABS Take 1 tablet by mouth daily.        Marland Kitchen ipratropium (ATROVENT) 0.03 % nasal spray Place 2 sprays into the nose every 12 (twelve) hours.  30 mL  12  . Multiple Vitamin (MULTIVITAMIN) tablet Take 1 tablet by mouth daily.        Marland Kitchen omeprazole (PRILOSEC) 20 MG capsule TAKE 1 CAPSULE BY MOUTH EVERY DAY  30 capsule  7  . Propylene Glycol (SYSTANE BALANCE OP) 1 drop daily.       . ranitidine (ZANTAC) 300 MG capsule Take 300 mg by mouth as needed.       . valACYclovir (VALTREX) 500 MG tablet Take 500 mg by mouth as needed.        Marland Kitchen DISCONTD: conjugated estrogens (PREMARIN) vaginal cream Place vaginally as directed. Use twice a  month   42.5 g  6  . acyclovir (ZOVIRAX) 400 MG tablet Take 400 mg by mouth as needed.       Marland Kitchen amoxicillin (AMOXIL) 500 MG capsule Take 500 mg by mouth. Take 4 pills orally at one time before dental procedure       . Calcium 500-125 MG-UNIT TABS Take 1 tablet by mouth daily.        . pantoprazole (PROTONIX) 40 MG tablet Take 1 tablet (40 mg total) by mouth daily.  30 tablet  3   BP 130/80  Pulse 68  Temp 98.2 F (36.8 C) (Oral)  Ht 5\' 6"  (1.676 m)  Wt 134 lb 4 oz (60.895 kg)  BMI 21.67 kg/m2   SpO2 95%  Review of Systems  Constitutional: Negative for fever, chills, appetite change, fatigue and unexpected weight change.  HENT: Negative for ear pain, congestion, sore throat, trouble swallowing, neck pain, voice change and sinus pressure.   Eyes: Negative for visual disturbance.  Respiratory: Negative for cough, shortness of breath, wheezing and stridor.   Cardiovascular: Negative for chest pain, palpitations and leg swelling.  Gastrointestinal: Negative for nausea, vomiting, abdominal pain, diarrhea, constipation, blood in stool, abdominal distention and anal bleeding.  Genitourinary: Positive for vaginal discharge (after using premarin only). Negative for dysuria, urgency, flank pain, vaginal bleeding, vaginal pain and pelvic pain.  Musculoskeletal: Negative for myalgias, arthralgias and gait problem.  Skin: Negative for color change and rash.  Neurological: Negative for dizziness and headaches.  Hematological: Negative for adenopathy. Does not bruise/bleed easily.  Psychiatric/Behavioral: Negative for suicidal ideas, disturbed wake/sleep cycle and dysphoric mood. The patient is not nervous/anxious.        Objective:   Physical Exam  Constitutional: She is oriented to person, place, and time. She appears well-developed and well-nourished. No distress.  HENT:  Head: Normocephalic and atraumatic.  Right Ear: External ear normal.  Left Ear: External ear normal.  Nose: Nose normal.  Mouth/Throat: Oropharynx is clear and moist. No oropharyngeal exudate.  Eyes: Conjunctivae normal are normal. Pupils are equal, round, and reactive to light. Right eye exhibits no discharge. Left eye exhibits no discharge. No scleral icterus.  Neck: Normal range of motion. Neck supple. No tracheal deviation present. No thyromegaly present.  Cardiovascular: Normal rate, regular rhythm, normal heart sounds and intact distal pulses.  Exam reveals no gallop and no friction rub.   No murmur  heard. Pulmonary/Chest: Effort normal and breath sounds normal. No respiratory distress. She has no wheezes. She has no rales. She exhibits no tenderness. Right breast exhibits no inverted nipple, no mass, no nipple discharge, no skin change and no tenderness. Left breast exhibits no inverted nipple, no mass, no nipple discharge, no skin change and no tenderness. Breasts are symmetrical.    Abdominal: Soft. Bowel sounds are normal. She exhibits no distension and no mass. There is no tenderness. There is no guarding.  Musculoskeletal: Normal range of motion. She exhibits no edema and no tenderness.  Lymphadenopathy:    She has no cervical adenopathy.  Neurological: She is alert and oriented to person, place, and time. No cranial nerve deficit. She exhibits normal muscle tone. Coordination normal.  Skin: Skin is warm and dry. No rash noted. She is not diaphoretic. No erythema. No pallor.  Psychiatric: She has a normal mood and affect. Her behavior is normal. Judgment and thought content normal.          Assessment & Plan:

## 2012-08-10 ENCOUNTER — Encounter: Payer: Self-pay | Admitting: Internal Medicine

## 2012-08-10 DIAGNOSIS — N952 Postmenopausal atrophic vaginitis: Secondary | ICD-10-CM

## 2012-08-11 ENCOUNTER — Encounter: Payer: Self-pay | Admitting: Internal Medicine

## 2012-08-11 MED ORDER — ESTROGENS, CONJUGATED 0.625 MG/GM VA CREA: TOPICAL_CREAM | VAGINAL | Status: DC

## 2012-08-12 ENCOUNTER — Other Ambulatory Visit: Payer: Self-pay | Admitting: Internal Medicine

## 2012-08-14 LAB — TSH: TSH: 1.58 u[IU]/mL (ref 0.450–4.500)

## 2012-09-15 IMAGING — US US EXTREM LOW VENOUS*L*
1 series · 17 of 24 positions shown · non-contrast
Comparison: none

REASON FOR EXAM: evaluate for DVT given swelling in patient's left thigh
and lower leg.
COMMENTS:

[Series 1: us extrem low venous*left* · 17 of 25 slices shown]
[im 1/25]
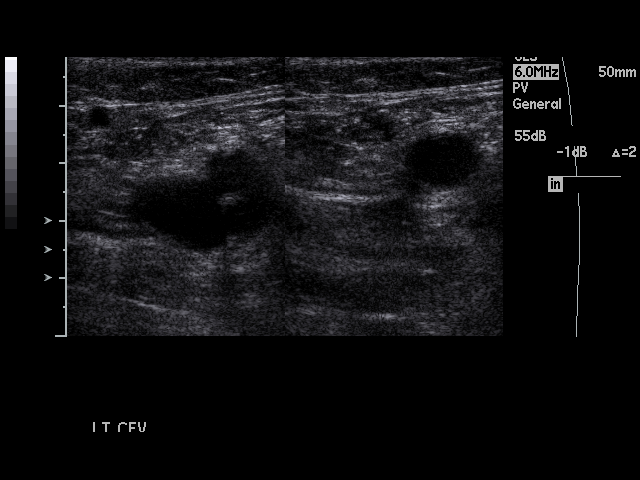
[im 3/25]
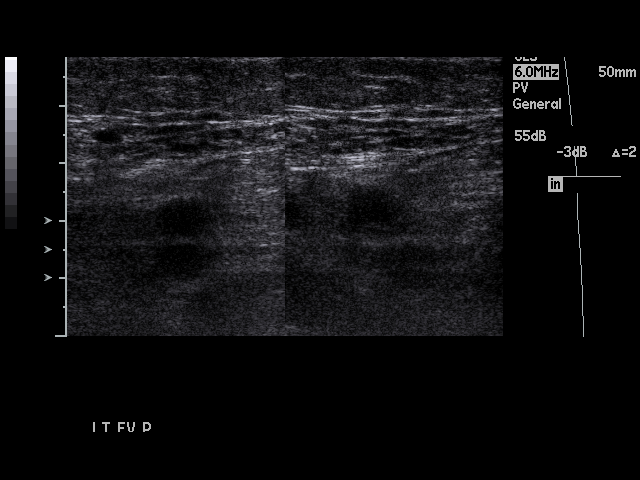
[im 4/25]
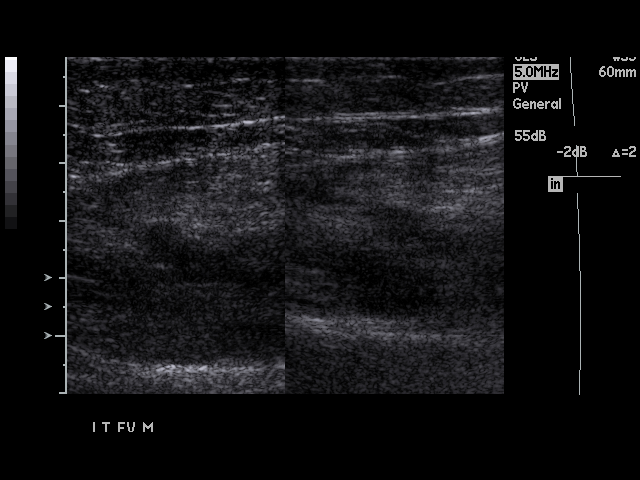
[im 5/25]
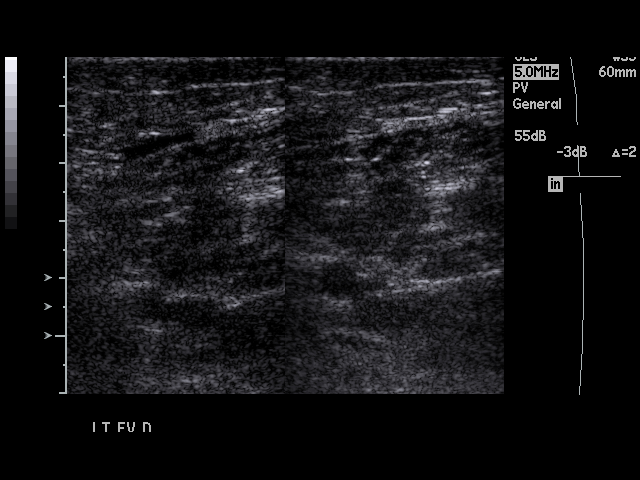
[im 7/25]
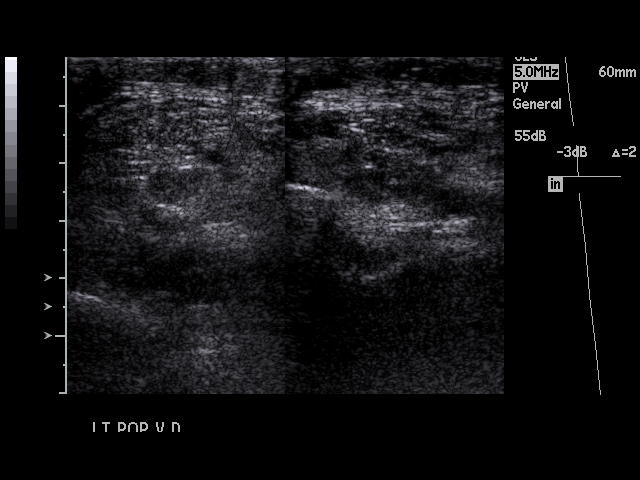
[im 8/25]
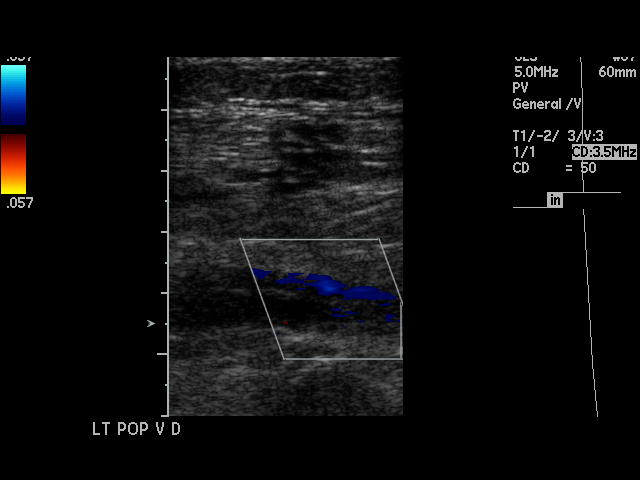
[im 10/25]
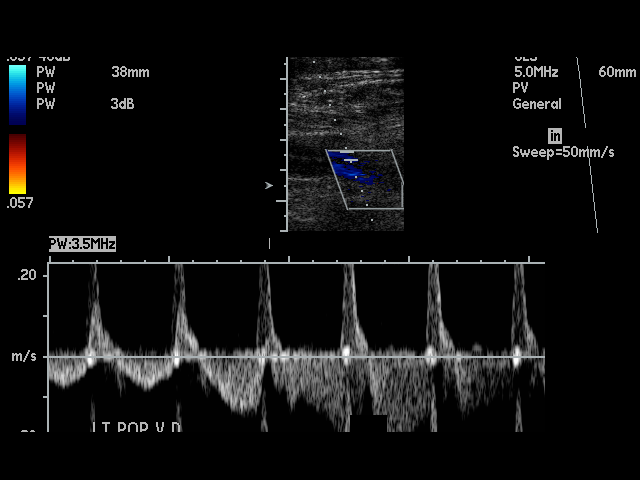
[im 11/25]
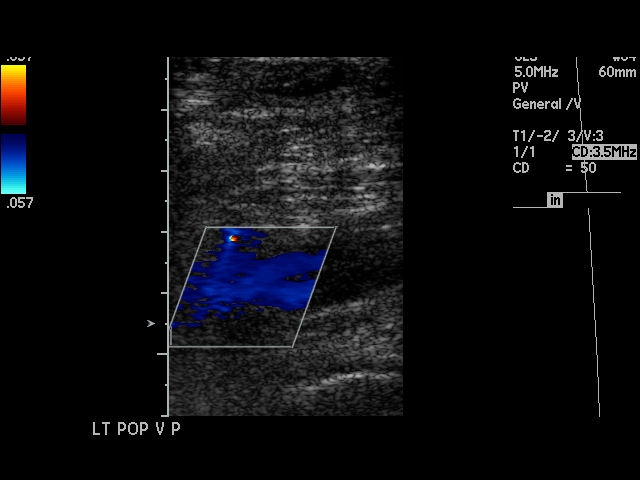
[im 13/25]
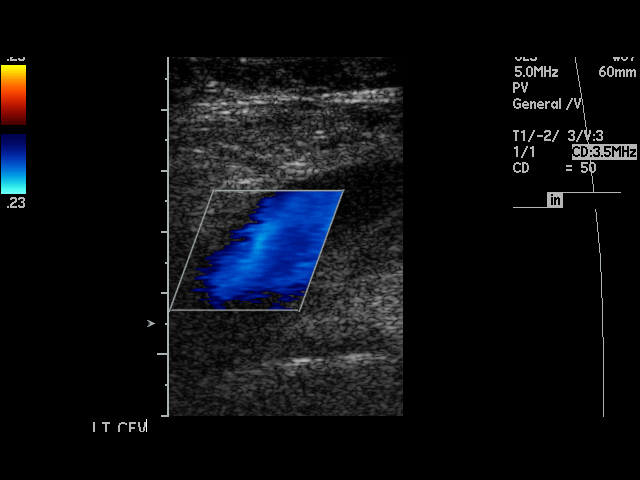
[im 14/25]
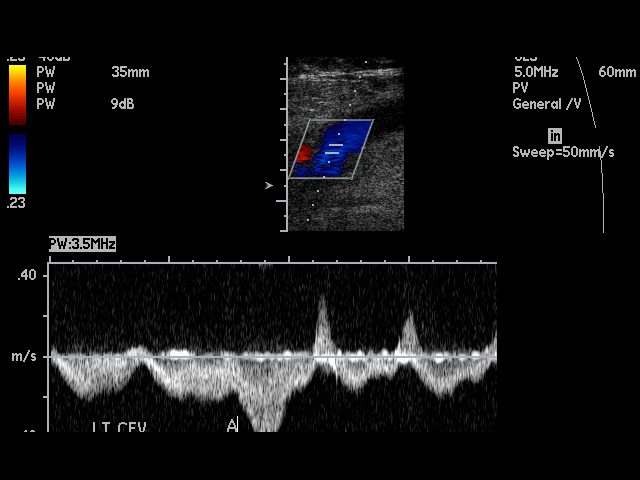
[im 15/25]
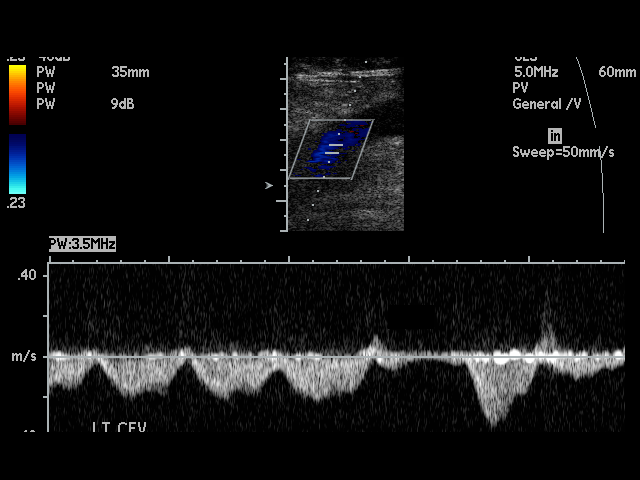
[im 17/25]
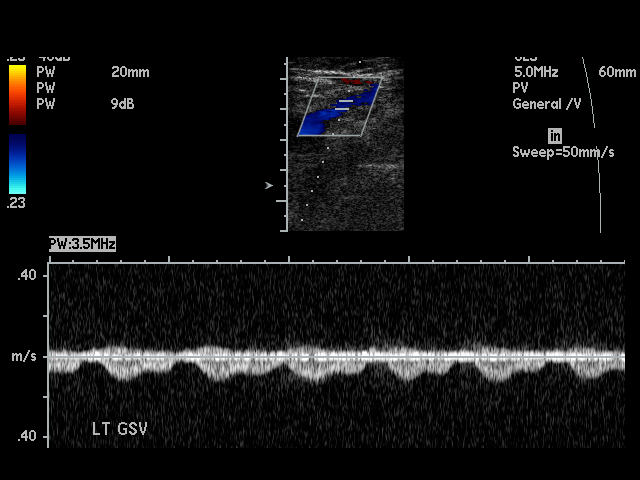
[im 18/25]
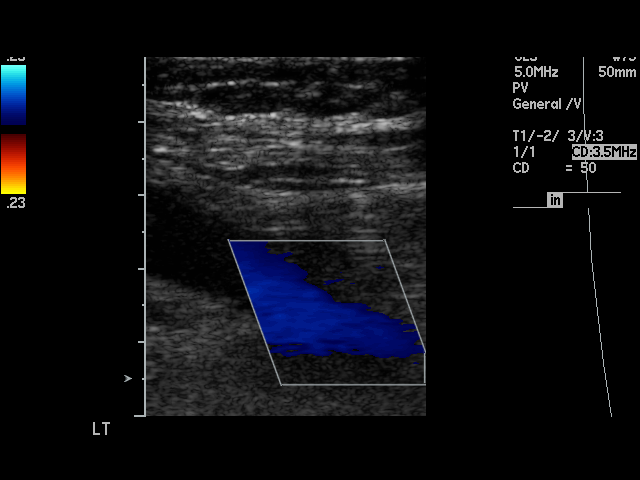
[im 20/25]
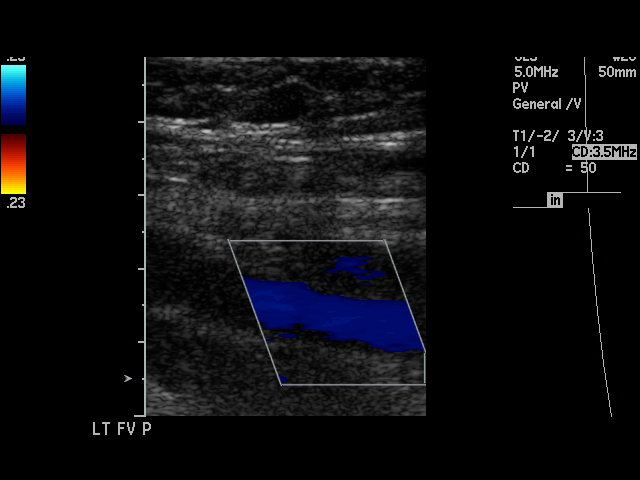
[im 21/25]
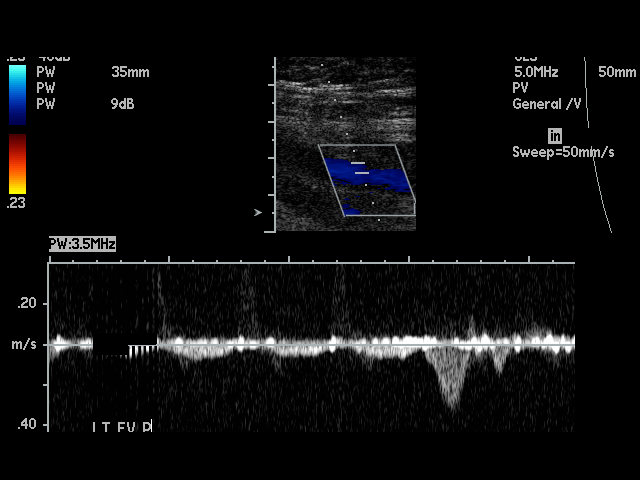
[im 22/25]
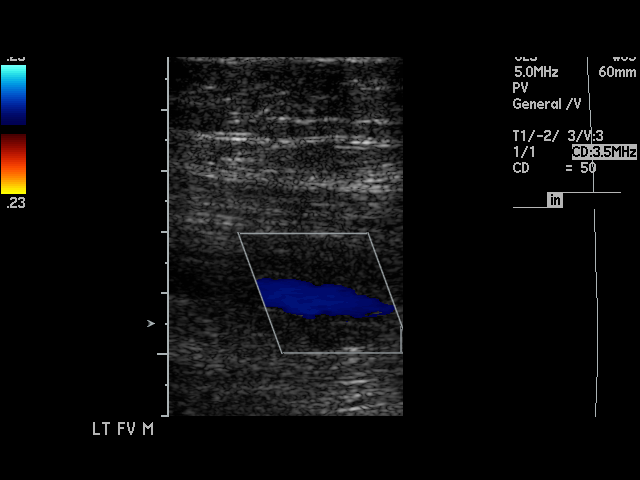
[im 25/25]
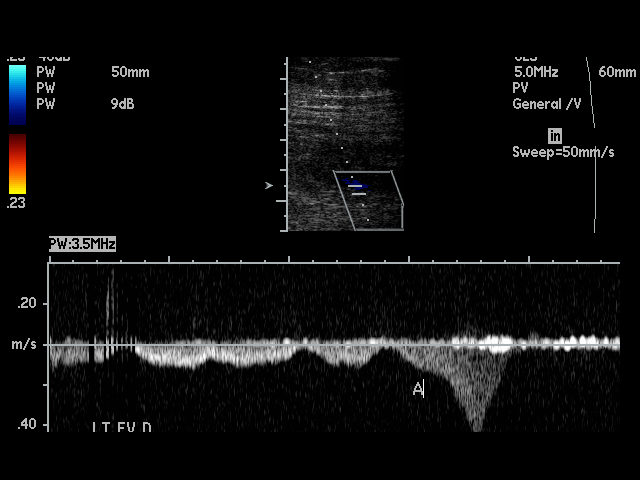

[17 of 24 positions shown; findings below may reference images not displayed]

PROCEDURE:     US  - US DOPPLER LOW EXTR LEFT  - September 15, 2011  [DATE]

RESULT:     The phasic, augmentation and Valsalva flow waveforms are normal
in appearance. The left femoral and popliteal vein shows complete
compressibility throughout its course. Doppler examination shows no
occlusion or evidence for deep vein thrombosis.
IMPRESSION: No deep vein thrombosis is identified in the left leg.

## 2012-10-02 ENCOUNTER — Ambulatory Visit: Payer: Self-pay | Admitting: Urology

## 2012-10-28 ENCOUNTER — Other Ambulatory Visit: Payer: Self-pay | Admitting: Family Medicine

## 2012-10-28 NOTE — Telephone Encounter (Signed)
Rx sent to PCP

## 2012-12-21 ENCOUNTER — Other Ambulatory Visit: Payer: Self-pay | Admitting: Internal Medicine

## 2013-05-26 ENCOUNTER — Other Ambulatory Visit: Payer: Self-pay | Admitting: *Deleted

## 2013-05-26 MED ORDER — OMEPRAZOLE 20 MG PO CPDR: DELAYED_RELEASE_CAPSULE | ORAL | Status: DC

## 2013-05-26 NOTE — Telephone Encounter (Signed)
Eprescribed.

## 2013-06-03 ENCOUNTER — Other Ambulatory Visit: Payer: Self-pay | Admitting: *Deleted

## 2013-06-03 MED ORDER — OMEPRAZOLE 20 MG PO CPDR: DELAYED_RELEASE_CAPSULE | ORAL | Status: DC

## 2013-06-03 NOTE — Telephone Encounter (Signed)
Patient called stating she would like a refill for 3 month supply of her Omeprazole instead of 1 month supply. Rx sent to [facility] for 3 month supply.

## 2013-06-04 ENCOUNTER — Telehealth: Payer: Self-pay | Admitting: *Deleted

## 2013-06-05 NOTE — Telephone Encounter (Signed)
Error

## 2013-08-28 ENCOUNTER — Other Ambulatory Visit: Payer: Self-pay

## 2013-09-04 ENCOUNTER — Ambulatory Visit (INDEPENDENT_AMBULATORY_CARE_PROVIDER_SITE_OTHER): Payer: Medicare Other | Admitting: Internal Medicine

## 2013-09-04 ENCOUNTER — Encounter: Payer: Self-pay | Admitting: Internal Medicine

## 2013-09-04 VITALS — BP 130/80 | HR 76 | Temp 98.1°F | Ht 65.0 in | Wt 131.0 lb

## 2013-09-04 DIAGNOSIS — Z1239 Encounter for other screening for malignant neoplasm of breast: Secondary | ICD-10-CM

## 2013-09-04 DIAGNOSIS — Z0000 Encounter for general adult medical examination without abnormal findings: Secondary | ICD-10-CM

## 2013-09-04 LAB — CBC WITH DIFFERENTIAL/PLATELET
Basophils Absolute: 0.1 10*3/uL (ref 0.0–0.1)
Basophils Relative: 1 % (ref 0.0–3.0)
Eosinophils Absolute: 0 10*3/uL (ref 0.0–0.7)
HCT: 41.4 % (ref 36.0–46.0)
Hemoglobin: 14 g/dL (ref 12.0–15.0)
Lymphs Abs: 1.4 10*3/uL (ref 0.7–4.0)
MCV: 90.2 fl (ref 78.0–100.0)
Monocytes Relative: 6.7 % (ref 3.0–12.0)
Neutro Abs: 4 10*3/uL (ref 1.4–7.7)
RBC: 4.59 Mil/uL (ref 3.87–5.11)
RDW: 16.3 % — ABNORMAL HIGH (ref 11.5–14.6)

## 2013-09-04 LAB — COMPREHENSIVE METABOLIC PANEL
ALT: 11 U/L (ref 0–35)
AST: 16 U/L (ref 0–37)
Albumin: 4 g/dL (ref 3.5–5.2)
Alkaline Phosphatase: 44 U/L (ref 39–117)
BUN: 25 mg/dL — ABNORMAL HIGH (ref 6–23)
Calcium: 9.2 mg/dL (ref 8.4–10.5)
Chloride: 104 mEq/L (ref 96–112)
Potassium: 3.9 mEq/L (ref 3.5–5.1)
Sodium: 139 mEq/L (ref 135–145)
Total Protein: 7 g/dL (ref 6.0–8.3)

## 2013-09-04 LAB — LIPID PANEL
Cholesterol: 152 mg/dL (ref 0–200)
HDL: 62.5 mg/dL (ref >39.00)
LDL Cholesterol: 73 mg/dL (ref 0–99)
Total CHOL/HDL Ratio: 2
Triglycerides: 85 mg/dL (ref 0.0–149.0)
VLDL: 17 mg/dL (ref 0.0–40.0)

## 2013-09-04 LAB — MICROALBUMIN / CREATININE URINE RATIO
Creatinine,U: 79.1 mg/dL
Microalb Creat Ratio: 0.5 mg/g (ref 0.0–30.0)
Microalb, Ur: 0.4 mg/dL (ref 0.0–1.9)

## 2013-09-04 NOTE — Progress Notes (Signed)
Pre-visit discussion using our clinic review tool. No additional management support is needed unless otherwise documented below in the visit note.  

## 2013-09-04 NOTE — Assessment & Plan Note (Signed)
General medical exam including breast exam is normal today. Pap is deferred because patient is status post hysterectomy. Mammogram is overdue, and pt agrees to schedule mammogram today. Colonoscopy is up-to-date. Will check labs including CBC, CMP, lipids today. Vaccinations are UTD. Follow up in 1 year and prn.

## 2013-09-04 NOTE — Progress Notes (Signed)
Subjective:    Patient ID: Mary Fields, female    DOB: June 16, 1941, 72 y.o.   MRN: [id number]  HPI The patient is here for annual Medicare wellness examination and management of other chronic and acute problems.   The risk factors are reflected in the social history.  The roster of all physicians providing medical care to patient - is listed in the Snapshot section of the chart.  Activities of daily living:  The patient is 100% independent in all ADLs: dressing, toileting, feeding as well as independent mobility  Home safety : The patient has smoke detectors in the home. They wear seatbelts.  There are no firearms at home. There is no violence in the home.   There is no risks for hepatitis, STDs or HIV. There is no history of blood transfusion. They have no travel history to infectious disease endemic areas of the world.  The patient has seen their dentist in the last six month. (Dr. Elissa Hefty) They have seen their eye doctor in the last year. (Dr. Dorcas Mcmurray) Hearing was checked several years ago. Normal. No issues with hearing. They have deferred audiologic testing in the last year.   Dr. Willeen Cass - ENT, recently seen because of severe pain around left ear  They do not  have excessive sun exposure. Discussed the need for sun protection: hats, long sleeves and use of sunscreen if there is significant sun exposure.   Diet: the importance of a healthy diet is discussed. They do have a relatively healthy diet.  The benefits of regular aerobic exercise were discussed. Walks daily.  Depression screen: there are no signs or vegative symptoms of depression- irritability, change in appetite, anhedonia, sadness/tearfullness.  Cognitive assessment: the patient manages all their financial and personal affairs and is actively engaged. They could relate day,date,year and events.  The following portions of the patient's history were reviewed and updated as appropriate: allergies, current medications,  past family history, past medical history,  past surgical history, past social history  and problem list.  Visual acuity was not assessed per patient preference since she has regular follow up with her ophthalmologist. Hearing and body mass index were assessed and reviewed.   During the course of the visit the patient was educated and counseled about appropriate screening and preventive services including : fall prevention , diabetes screening, nutrition counseling, colorectal cancer screening, and recommended immunizations.    Outpatient Encounter Prescriptions as of 09/04/2013  Medication Sig  . aspirin 81 MG tablet Take 81 mg by mouth daily.    . Calcium 500-125 MG-UNIT TABS Take 1 tablet by mouth daily.    . Glucosamine-Chondroitin (OSTEO BI-FLEX REGULAR STRENGTH) 250-200 MG TABS Take 1 tablet by mouth daily.    Marland Kitchen ipratropium (ATROVENT) 0.03 % nasal spray PLACE 2 SPRAYS INTO THE NOSE EVERY 12 (TWELVE) HOURS.  . Multiple Vitamin (MULTIVITAMIN) tablet Take 1 tablet by mouth daily.    Marland Kitchen omeprazole (PRILOSEC) 20 MG capsule TAKE 1 CAPSULE BY MOUTH EVERY DAY  . Propylene Glycol (SYSTANE BALANCE OP) 1 drop daily.   . ranitidine (ZANTAC) 300 MG capsule Take 300 mg by mouth as needed.   Marland Kitchen acyclovir (ZOVIRAX) 400 MG tablet Take 400 mg by mouth as needed.   Marland Kitchen acyclovir (ZOVIRAX) 400 MG tablet TAKE 1 TABLET BY MOUTH 4 TIMES DAILY FOR 7 DAYS FOR OUTBREAK  . conjugated estrogens (PREMARIN) vaginal cream Place vaginally as directed. Use twice a month  . pantoprazole (PROTONIX) 40 MG tablet Take 1 tablet (40 mg  total) by mouth daily.  . valACYclovir (VALTREX) 500 MG tablet Take 500 mg by mouth as needed.    . [DISCONTINUED] amoxicillin (AMOXIL) 500 MG capsule Take 500 mg by mouth. Take 4 pills orally at one time before dental procedure    BP 130/80  Pulse 76  Temp(Src) 98.1 F (36.7 C) (Oral)  Ht 5\' 5"  (1.651 m)  Wt 131 lb (59.421 kg)  BMI 21.80 kg/m2  SpO2 95%   Review of Systems   Constitutional: Negative for fever, chills, appetite change, fatigue and unexpected weight change.  HENT: Negative for congestion, ear pain, sinus pressure, sore throat, trouble swallowing and voice change.   Eyes: Negative for visual disturbance.  Respiratory: Negative for cough, shortness of breath, wheezing and stridor.   Cardiovascular: Negative for chest pain, palpitations and leg swelling.  Gastrointestinal: Negative for nausea, vomiting, abdominal pain, diarrhea, constipation, blood in stool, abdominal distention and anal bleeding.  Genitourinary: Negative for dysuria and flank pain.  Musculoskeletal: Negative for arthralgias, gait problem, myalgias and neck pain.  Skin: Negative for color change and rash.  Neurological: Negative for dizziness and headaches.  Hematological: Negative for adenopathy. Does not bruise/bleed easily.  Psychiatric/Behavioral: Negative for suicidal ideas, sleep disturbance and dysphoric mood. The patient is not nervous/anxious.        Objective:   Physical Exam  Constitutional: She is oriented to person, place, and time. She appears well-developed and well-nourished. No distress.  HENT:  Head: Normocephalic and atraumatic.  Right Ear: External ear normal.  Left Ear: External ear normal.  Nose: Nose normal.  Mouth/Throat: Oropharynx is clear and moist. No oropharyngeal exudate.  Eyes: Conjunctivae are normal. Pupils are equal, round, and reactive to light. Right eye exhibits no discharge. Left eye exhibits no discharge. No scleral icterus.  Neck: Normal range of motion. Neck supple. No tracheal deviation present. No thyromegaly present.  Cardiovascular: Normal rate, regular rhythm, normal heart sounds and intact distal pulses.  Exam reveals no gallop and no friction rub.   No murmur heard. Pulmonary/Chest: Effort normal and breath sounds normal. No accessory muscle usage. Not tachypneic. No respiratory distress. She has no decreased breath sounds. She has  no wheezes. She has no rales. She exhibits no tenderness. Right breast exhibits no inverted nipple, no mass, no nipple discharge, no skin change and no tenderness. Left breast exhibits no inverted nipple, no mass, no nipple discharge, no skin change and no tenderness. Breasts are symmetrical.  Abdominal: Soft. Bowel sounds are normal. She exhibits no distension and no mass. There is no tenderness. There is no rebound and no guarding.  Musculoskeletal: Normal range of motion. She exhibits no edema and no tenderness.  Lymphadenopathy:    She has no cervical adenopathy.  Neurological: She is alert and oriented to person, place, and time. No cranial nerve deficit. She exhibits normal muscle tone. Coordination normal.  Skin: Skin is warm and dry. No rash noted. She is not diaphoretic. No erythema. No pallor.  Psychiatric: She has a normal mood and affect. Her behavior is normal. Judgment and thought content normal.          Assessment & Plan:

## 2013-09-05 ENCOUNTER — Telehealth: Payer: Self-pay | Admitting: *Deleted

## 2013-09-05 NOTE — Telephone Encounter (Signed)
Left message, notifying pt that labs were normal and a copy will be mailed to her and also about Wellness forms.

## 2013-09-05 NOTE — Telephone Encounter (Signed)
Her labs were normal. We can mail her a copy. I reviewed the information needed for her Medicare Wellness visit and manually entered it in the chart.

## 2013-09-05 NOTE — Telephone Encounter (Signed)
Pt calling requesting lab results-she is not able to get into her mychart. Please call her soon with her lab results & mail her a copy also. Pt states that she was pleased with her visit yesterday. She was given her list of medications at the front desk to update, she made corrections & reports that nothing was corrected on her list. She also reports that she took the time to fill out the medicare wellness forms & nobody even asked her for them. Pt states that she will drop the forms off by here sometime next week & I have corrected her med list.

## 2013-09-15 ENCOUNTER — Encounter: Payer: Self-pay | Admitting: Emergency Medicine

## 2013-09-24 ENCOUNTER — Other Ambulatory Visit: Payer: Self-pay | Admitting: Internal Medicine

## 2013-09-24 NOTE — Telephone Encounter (Signed)
Ok to refill 

## 2013-10-21 ENCOUNTER — Telehealth: Payer: Self-pay | Admitting: Emergency Medicine

## 2013-10-21 NOTE — Telephone Encounter (Signed)
Patient called stating she needs a referral for [facility] for a atypical mole on her L thigh. Pt will be seen on 10/24/13 at 11:15 a.m.

## 2013-10-21 NOTE — Telephone Encounter (Addendum)
Spoke with [facility] who was calling to see how the process works, I explained to them that this appointment will be cancelled because [employer] claims can take up to 10 days to process on our part. They will call the patient and explain to her the process. Once approved by [facility] on our part they will help r/s pt. We will also need progress note/ documentation on why the patients needs to see the MD.

## 2013-10-22 NOTE — Telephone Encounter (Signed)
OK. Then, pt needs to be seen in our office first in order for me to make the referral.

## 2013-10-22 NOTE — Telephone Encounter (Signed)
FYI to Dr. Walker 

## 2013-10-24 NOTE — Telephone Encounter (Signed)
Spoke with patient she stated she has already spoken with Dr. Gilford Rile about this. 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 She is shocked that she has to come in for this when this but if she has to then she will. Appointment scheduled for Monday 10/27/13

## 2013-10-27 ENCOUNTER — Ambulatory Visit: Payer: Medicare Other | Admitting: Internal Medicine

## 2013-11-25 ENCOUNTER — Encounter: Payer: Self-pay | Admitting: Internal Medicine

## 2013-12-18 ENCOUNTER — Encounter: Payer: Self-pay | Admitting: Internal Medicine

## 2013-12-25 ENCOUNTER — Ambulatory Visit (INDEPENDENT_AMBULATORY_CARE_PROVIDER_SITE_OTHER): Payer: Commercial Managed Care - HMO | Admitting: *Deleted

## 2013-12-25 DIAGNOSIS — Z23 Encounter for immunization: Secondary | ICD-10-CM

## 2014-01-20 ENCOUNTER — Encounter: Payer: Self-pay | Admitting: Internal Medicine

## 2014-01-21 ENCOUNTER — Ambulatory Visit (INDEPENDENT_AMBULATORY_CARE_PROVIDER_SITE_OTHER): Payer: Commercial Managed Care - HMO | Admitting: Internal Medicine

## 2014-01-21 ENCOUNTER — Ambulatory Visit: Payer: Commercial Managed Care - HMO | Admitting: Internal Medicine

## 2014-01-21 ENCOUNTER — Encounter: Payer: Self-pay | Admitting: Internal Medicine

## 2014-01-21 VITALS — BP 120/72 | HR 79 | Temp 98.1°F | Resp 16 | Wt 132.5 lb

## 2014-01-21 DIAGNOSIS — S30861A Insect bite (nonvenomous) of abdominal wall, initial encounter: Secondary | ICD-10-CM

## 2014-01-21 DIAGNOSIS — S30860A Insect bite (nonvenomous) of lower back and pelvis, initial encounter: Secondary | ICD-10-CM

## 2014-01-21 DIAGNOSIS — W57XXXA Bitten or stung by nonvenomous insect and other nonvenomous arthropods, initial encounter: Principal | ICD-10-CM

## 2014-01-21 MED ORDER — DOXYCYCLINE HYCLATE 100 MG PO CAPS: 100.0000 mg | ORAL_CAPSULE | Freq: Two times a day (BID) | ORAL | Status: DC

## 2014-01-21 MED ORDER — CULTURELLE DIGESTIVE HEALTH PO CAPS: 1.0000 | ORAL_CAPSULE | Freq: Every day | ORAL | Status: DC

## 2014-01-21 NOTE — Progress Notes (Signed)
Patient ID: Mary Fields, female   DOB: 11-11-1940, 73 y.o.   MRN: [id number]   Patient Active Problem List   Diagnosis Date Noted  . Tick bite of groin 01/23/2014  . Medicare annual wellness visit, subsequent 08/08/2012  . Screening for breast cancer 08/08/2012  . Atrophic vaginitis 08/08/2012  . GERD (gastroesophageal reflux disease) 05/07/2012  . Back pain 02/07/2012  . Screening for lipoid disorders 06/27/2011  . ALLERGIC RHINITIS 06/23/2010  . VAGINITIS, ATROPHIC, MILD 06/23/2010  . TRANSIENT ISCHEMIC ATTACKS, HX OF 04/30/2009  . NOCTURNAL CRAMPS 06/25/2008  . MOLE 05/14/2008  . FATIGUE 05/14/2008  . HERPES SIMPLEX INFECTION 05/16/2007  . RENAL CELL CANCER 05/16/2007  . ASTHMA 05/16/2007  . GERD 05/16/2007  . COLITIS, ULCERATIVE 05/16/2007  . OSTEOPENIA 05/16/2007  . PALPITATIONS 05/16/2007  . Personal History of Malignant Neoplasm of Breast 05/16/2007  . SKIN CANCER, HX OF 05/16/2007    Subjective:  CC:   Chief Complaint  Patient presents with  . Acute Visit    removed tick from left groin area. 17 hours ago.    HPI:   Mary Fields is a 73 y.o. female who presents for Swelling and discoloration of left side of groin.  Patient states that she Pulled a tick off of her groin yesterday evening,  Worried that there is still  A tick under the skin   Denies headache, fevers and pain.   Lft groin ,  Takes aspirin has ecchymosis,  Redness,   Early infeciton no tick patrts seen  Butttockes and vagina checked as well ,    Past Medical History  Diagnosis Date  . Other malaise and fatigue   . Benign neoplasm of skin, site unspecified     Dr. Aubery Lapping  . 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 Dizziness and giddiness   . Palpitations   . Herpes simplex without mention of complication   . Ulcerative colitis, unspecified   . Malignant neoplasm of kidney, except pelvis   . Disorder of bone and cartilage, unspecified   . Esophageal  reflux   . Personal history of other malignant neoplasm of skin   . Personal history of malignant neoplasm of breast   . Hx-TIA (transient ischemic attack)   . Atypical nevi   . Diverticulosis   . Erosive esophagitis   . Colitis   . Unspecified asthma(493.90)     since childhood  . Hip fracture 11/12    surgery    Past Surgical History  Procedure Laterality Date  . Melanoma excision  1980    Dr. Sharlet Salina  . Nephrectomy  2000    left renal cell carcinoma, Dr. Jacqlyn Larsen  . Hip fracture surgery  2005    hemiarthroplasty right, Dr. Francia Greaves  . Esophagogastroduodenoscopy  2002    esophagitis; grade 1, erosive  . Colonoscopy  4/10    diverticulosis; recheck 5 years  . Appendectomy    . Abdominal hysterectomy  1986    endometriosis, Dr. Pearletha Furl  . Breast lumpectomy  1995    right breast cancer, lumpectomy and XRT and chemo  . Hip fracture surgery  11/12    after fall at church       The following portions of the patient's history were reviewed and updated as appropriate: Allergies, current medications, and problem list.    Review of Systems:   Patient denies headache, fevers, malaise, unintentional weight loss, skin rash, eye pain, sinus congestion and sinus pain, sore throat, dysphagia,  hemoptysis , cough, dyspnea, wheezing, chest pain, palpitations, orthopnea, edema, abdominal pain, nausea, melena, diarrhea, constipation, flank pain, dysuria, hematuria, urinary  Frequency, nocturia, numbness, tingling, seizures,  Focal weakness, Loss of consciousness,  Tremor, insomnia, depression, anxiety, and suicidal ideation.     History   Social History  . Marital Status: Widowed    Spouse Name: N/A    Number of Children: N/A  . Years of Education: N/A   Occupational History  . Not on file.   Social History Main Topics  . Smoking status: Former Smoker    Quit date: 08/02/1984  . Smokeless tobacco: Not on file  . Alcohol Use: Yes     Comment: occassionaly  . Drug Use: No  .  Sexual Activity: Not on file   Other Topics Concern  . Not on file   Social History Narrative   Widowed. Lives in [place].      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 ROM normal. No CVA tenderness. Lungs: clear to auscultation bilaterally Heart: regular rate and rhythm, S1, S2 normal, no murmur, click, rub or gallop Abdomen: soft, non-tender; bowel sounds normal; no masses,  no organomegaly Pulses: 2+ and symmetric Skin: left groin with ecchymosis,  Swelling and redness about dime sized in diameter.  No evidence of tick parts. No rash  Lymph nodes: Cervical, supraclavicular, and axillary nodes normal.  Assessment and Plan:  Tick bite of groin She has no signs of retained tick parts in left groin,  Minimal redness noted,  But has ecchymosis .  Empiric doxycycline.    Updated Medication List Outpatient Encounter Prescriptions as of 01/21/2014  Medication Sig  . acyclovir (ZOVIRAX) 400 MG tablet TAKE 1 TABLET BY MOUTH 4 TIMES DAILY FOR 7 DAYS FOR OUTBREAK  . aspirin 81 MG tablet Take 81 mg by mouth daily.    . Calcium 500-125 MG-UNIT TABS Take 1 tablet by mouth daily.    Marland Kitchen doxycycline (VIBRAMYCIN) 100 MG capsule Take 1 capsule (100 mg total) by mouth 2 (two) times daily.  . Glucosamine-Chondroitin (OSTEO BI-FLEX REGULAR STRENGTH) 250-200 MG TABS Take 1 tablet by mouth daily.    . Lactobacillus-Inulin (Whitefish) CAPS Take 1 capsule by mouth daily.  . Multiple Vitamin (MULTIVITAMIN) tablet Take 1  tablet by mouth daily.    Marland Kitchen omeprazole (PRILOSEC) 20 MG capsule TAKE 1 CAPSULE BY MOUTH EVERY DAY  . Propylene Glycol (SYSTANE BALANCE OP) 1 drop daily.   . ranitidine (ZANTAC) 300 MG capsule Take 300 mg by mouth as needed.   . [DISCONTINUED] acyclovir (ZOVIRAX) 400 MG tablet Take 400 mg by mouth as needed.   . [DISCONTINUED] pantoprazole (PROTONIX) 40 MG tablet Take 1 tablet (40 mg total) by mouth daily.  . [DISCONTINUED] valACYclovir (VALTREX) 500 MG tablet Take 500 mg by mouth as needed.       No orders of the defined types were placed in this encounter.    No Follow-up on file.

## 2014-01-21 NOTE — Patient Instructions (Signed)
There does not appear to be any tick parts embedded under your skin.  The blackness you see is blood under the skin   I am treating you with doxycyline to protect you again infection  Take it twice daily WTH FOOD to prevent nausea  Please take a probiotic ( Align, Floraque or Culturelle) while you are on the antibiotic to prevent a serious antibiotic associated diarrhea  Called clostirudium dificile colitis and a vaginal yeast infection   You can substitute daily Activia or similar yogurt instead of the probiotic  Use a warm compress on the infected area e times daily for 15 minutes  Return next week to your regular doctor for a recheck.

## 2014-01-21 NOTE — Progress Notes (Signed)
Pre-visit discussion using our clinic review tool. No additional management support is needed unless otherwise documented below in the visit note.  

## 2014-01-23 DIAGNOSIS — W57XXXA Bitten or stung by nonvenomous insect and other nonvenomous arthropods, initial encounter: Principal | ICD-10-CM

## 2014-01-23 DIAGNOSIS — S30861A Insect bite (nonvenomous) of abdominal wall, initial encounter: Secondary | ICD-10-CM | POA: Insufficient documentation

## 2014-01-23 NOTE — Assessment & Plan Note (Addendum)
She has no signs of retained tick parts in left groin,  Minimal redness noted,  But has ecchymosis .  Empiric doxycycline.

## 2014-01-30 ENCOUNTER — Encounter: Payer: Self-pay | Admitting: Internal Medicine

## 2014-01-30 ENCOUNTER — Ambulatory Visit (INDEPENDENT_AMBULATORY_CARE_PROVIDER_SITE_OTHER): Payer: Commercial Managed Care - HMO | Admitting: Internal Medicine

## 2014-01-30 VITALS — BP 138/78 | HR 65 | Temp 98.2°F | Wt 132.0 lb

## 2014-01-30 DIAGNOSIS — J309 Allergic rhinitis, unspecified: Secondary | ICD-10-CM

## 2014-01-30 DIAGNOSIS — S30860A Insect bite (nonvenomous) of lower back and pelvis, initial encounter: Secondary | ICD-10-CM

## 2014-01-30 DIAGNOSIS — S30861A Insect bite (nonvenomous) of abdominal wall, initial encounter: Secondary | ICD-10-CM

## 2014-01-30 DIAGNOSIS — I499 Cardiac arrhythmia, unspecified: Secondary | ICD-10-CM

## 2014-01-30 DIAGNOSIS — Z1239 Encounter for other screening for malignant neoplasm of breast: Secondary | ICD-10-CM

## 2014-01-30 DIAGNOSIS — W57XXXA Bitten or stung by nonvenomous insect and other nonvenomous arthropods, initial encounter: Secondary | ICD-10-CM

## 2014-01-30 LAB — CBC WITH DIFFERENTIAL/PLATELET
BASOS ABS: 0 10*3/uL (ref 0.0–0.1)
Basophils Relative: 0.5 % (ref 0.0–3.0)
Eosinophils Absolute: 0 10*3/uL (ref 0.0–0.7)
Eosinophils Relative: 0.7 % (ref 0.0–5.0)
HEMATOCRIT: 40.4 % (ref 36.0–46.0)
Hemoglobin: 13.4 g/dL (ref 12.0–15.0)
LYMPHS ABS: 1.6 10*3/uL (ref 0.7–4.0)
Lymphocytes Relative: 22.2 % (ref 12.0–46.0)
MCHC: 33.3 g/dL (ref 30.0–36.0)
MCV: 92.2 fl (ref 78.0–100.0)
Monocytes Absolute: 0.4 10*3/uL (ref 0.1–1.0)
Monocytes Relative: 6 % (ref 3.0–12.0)
NEUTROS ABS: 5.2 10*3/uL (ref 1.4–7.7)
Neutrophils Relative %: 70.6 % (ref 43.0–77.0)
PLATELETS: 230 10*3/uL (ref 150.0–400.0)
RBC: 4.38 Mil/uL (ref 3.87–5.11)
RDW: 16.4 % — AB (ref 11.5–14.6)
WBC: 7.3 10*3/uL (ref 4.5–10.5)

## 2014-01-30 LAB — COMPREHENSIVE METABOLIC PANEL
ALBUMIN: 4 g/dL (ref 3.5–5.2)
ALT: 12 U/L (ref 0–35)
AST: 18 U/L (ref 0–37)
Alkaline Phosphatase: 48 U/L (ref 39–117)
BUN: 25 mg/dL — AB (ref 6–23)
CALCIUM: 9.5 mg/dL (ref 8.4–10.5)
CHLORIDE: 104 meq/L (ref 96–112)
CO2: 25 meq/L (ref 19–32)
Creatinine, Ser: 1 mg/dL (ref 0.4–1.2)
GFR: 59.46 mL/min — AB (ref >60.00)
Glucose, Bld: 77 mg/dL (ref 70–99)
POTASSIUM: 4.1 meq/L (ref 3.5–5.1)
Sodium: 137 mEq/L (ref 135–145)
Total Bilirubin: 0.4 mg/dL (ref 0.3–1.2)
Total Protein: 7 g/dL (ref 6.0–8.3)

## 2014-01-30 LAB — HM COLONOSCOPY

## 2014-01-30 LAB — HM MAMMOGRAPHY

## 2014-01-30 LAB — TSH: TSH: 0.56 u[IU]/mL (ref 0.35–5.50)

## 2014-01-30 MED ORDER — GENTAMICIN SULFATE 0.1 % EX CREA: 1.0000 "application " | TOPICAL_CREAM | Freq: Three times a day (TID) | CUTANEOUS | Status: DC

## 2014-01-30 MED ORDER — RANITIDINE HCL 300 MG PO CAPS: 300.0000 mg | ORAL_CAPSULE | ORAL | Status: DC | PRN

## 2014-01-30 MED ORDER — OMEPRAZOLE 20 MG PO CPDR: DELAYED_RELEASE_CAPSULE | ORAL | Status: DC

## 2014-01-30 NOTE — Assessment & Plan Note (Signed)
Pt declines any further mammograms.

## 2014-01-30 NOTE — Assessment & Plan Note (Signed)
Symptoms consistent with allergic rhinitis. No improvement with Claritin or Zyrtec. Discussed nasal steroids. She would like to try Nasacort OTC, despite previous h/o nosebleeds with nasal steroid. Encouraged her to be cautious with this. If no improvement, consider referral to allergy.

## 2014-01-30 NOTE — Progress Notes (Signed)
Pre visit review using our clinic review tool, if applicable. No additional management support is needed unless otherwise documented below in the visit note. 

## 2014-01-30 NOTE — Assessment & Plan Note (Signed)
Area appears to be healing well with only minimal residual redness at site. Completed doxycycline. Will continue to monitor. Will apply topical gentamicin to area to prevent secondary bacterial infection.

## 2014-01-30 NOTE — Progress Notes (Signed)
Subjective:    Patient ID: Mary Fields, female    DOB: 02-25-1941, 73 y.o.   MRN: [id number]  HPI 73YO female presents to follow up tick bite.  Started on doxycycline after tick bite last week. Redness at site has improved greatly. No fever, chills. She has completed doxycycline.  Irregular heart beats - Occurred one night while trying to go to sleep. Described as fast with pauses. Lasted about 5 min. Resolved on its own. No chest pain, sweating. No shortness of breath at that time.  Seasonal allergies - Notes increased sneezing, nasal drainage, watery eyes over last few weeks. Tried Claritin and Zyrtec with no improvement. Tried nasal steroid last year, but developed nosebleeds. No fever, chills, cough.  Review of Systems  Constitutional: Negative for fever, chills, appetite change, fatigue and unexpected weight change.  HENT: Positive for congestion, postnasal drip, rhinorrhea and sneezing. Negative for ear pain, sinus pressure, sore throat, trouble swallowing and voice change.   Eyes: Positive for itching. Negative for visual disturbance.  Respiratory: Negative for cough, shortness of breath, wheezing and stridor.   Cardiovascular: Positive for palpitations. Negative for chest pain and leg swelling.  Gastrointestinal: Negative for nausea, vomiting, abdominal pain, diarrhea, constipation, blood in stool, abdominal distention and anal bleeding.  Genitourinary: Negative for dysuria and flank pain.  Musculoskeletal: Negative for arthralgias, gait problem, myalgias and neck pain.  Skin: Negative for color change and rash.  Neurological: Negative for dizziness and headaches.  Hematological: Negative for adenopathy. Does not bruise/bleed easily.  Psychiatric/Behavioral: Negative for suicidal ideas, sleep disturbance and dysphoric mood. The patient is not nervous/anxious.        Objective:    BP 138/78  Pulse 65  Temp(Src) 98.2 F (36.8 C) (Oral)  Wt 132 lb (59.875 kg)  SpO2  97% Physical Exam  Constitutional: She is oriented to person, place, and time. She appears well-developed and well-nourished. No distress.  HENT:  Head: Normocephalic and atraumatic.  Right Ear: External ear normal.  Left Ear: External ear normal.  Nose: Nose normal.  Mouth/Throat: Oropharynx is clear and moist. No oropharyngeal exudate.  Eyes: Conjunctivae are normal. Pupils are equal, round, and reactive to light. Right eye exhibits no discharge. Left eye exhibits no discharge. No scleral icterus.  Neck: Normal range of motion. Neck supple. No tracheal deviation present. No thyromegaly present.  Cardiovascular: Normal rate, regular rhythm, normal heart sounds and intact distal pulses.  Exam reveals no gallop and no friction rub.   No murmur heard. Pulmonary/Chest: Effort normal and breath sounds normal. No accessory muscle usage. Not tachypneic. No respiratory distress. She has no decreased breath sounds. She has no wheezes. She has no rhonchi. She has no rales. She exhibits no tenderness.  Musculoskeletal: Normal range of motion. She exhibits no edema and no tenderness.  Lymphadenopathy:    She has no cervical adenopathy.  Neurological: She is alert and oriented to person, place, and time. No cranial nerve deficit. She exhibits normal muscle tone. Coordination normal.  Skin: Skin is warm and dry. No rash noted. She is not diaphoretic. No erythema. No pallor.  Psychiatric: Her behavior is normal. Judgment and thought content normal. Her mood appears anxious.          Assessment & Plan:   Problem List Items Addressed This Visit   Allergic rhinitis     Symptoms consistent with allergic rhinitis. No improvement with Claritin or Zyrtec. Discussed nasal steroids. She would like to try Nasacort OTC, despite previous h/o nosebleeds with  nasal steroid. Encouraged her to be cautious with this. If no improvement, consider referral to allergy.    Irregular heart beat - Primary     Brief  episode of "irregular" heart beats. Exam is normal today. EKG normal. Will check electrolytes, TSH, CBC. Pt has had evaluation in the past with Holter which was normal. Suspect that anxiety plays a role. Plan for follow up in 2 weeks. If any recurrence, then will plan to get repeat cardiology evaluation.    Relevant Orders      TSH      Comprehensive metabolic panel      CBC with Differential      EKG 12-Lead (Completed)   Screening for breast cancer     Pt declines any further mammograms.    Tick bite of groin     Area appears to be healing well with only minimal residual redness at site. Completed doxycycline. Will continue to monitor. Will apply topical gentamicin to area to prevent secondary bacterial infection.    Relevant Medications      gentamicin (GARAMYCIN) cream 0.1%       Return in about 7 months (around 09/01/2014) for Wellness Visit.

## 2014-01-30 NOTE — Assessment & Plan Note (Signed)
Brief episode of "irregular" heart beats. Exam is normal today. EKG normal. Will check electrolytes, TSH, CBC. Pt has had evaluation in the past with Holter which was normal. Suspect that anxiety plays a role. Plan for follow up in 2 weeks. If any recurrence, then will plan to get repeat cardiology evaluation.

## 2014-02-11 ENCOUNTER — Ambulatory Visit (INDEPENDENT_AMBULATORY_CARE_PROVIDER_SITE_OTHER): Payer: Commercial Managed Care - HMO | Admitting: Internal Medicine

## 2014-02-11 ENCOUNTER — Ambulatory Visit: Payer: Commercial Managed Care - HMO | Admitting: Internal Medicine

## 2014-02-11 ENCOUNTER — Encounter: Payer: Self-pay | Admitting: Internal Medicine

## 2014-02-11 VITALS — BP 116/70 | HR 75 | Temp 98.2°F | Wt 134.0 lb

## 2014-02-11 DIAGNOSIS — R200 Anesthesia of skin: Secondary | ICD-10-CM | POA: Insufficient documentation

## 2014-02-11 DIAGNOSIS — S30861A Insect bite (nonvenomous) of abdominal wall, initial encounter: Secondary | ICD-10-CM

## 2014-02-11 DIAGNOSIS — J309 Allergic rhinitis, unspecified: Secondary | ICD-10-CM

## 2014-02-11 DIAGNOSIS — I499 Cardiac arrhythmia, unspecified: Secondary | ICD-10-CM

## 2014-02-11 DIAGNOSIS — W57XXXA Bitten or stung by nonvenomous insect and other nonvenomous arthropods, initial encounter: Secondary | ICD-10-CM

## 2014-02-11 DIAGNOSIS — L989 Disorder of the skin and subcutaneous tissue, unspecified: Secondary | ICD-10-CM | POA: Insufficient documentation

## 2014-02-11 DIAGNOSIS — R209 Unspecified disturbances of skin sensation: Secondary | ICD-10-CM

## 2014-02-11 DIAGNOSIS — S30860A Insect bite (nonvenomous) of lower back and pelvis, initial encounter: Secondary | ICD-10-CM

## 2014-02-11 MED ORDER — RANITIDINE HCL 300 MG PO TABS: 300.0000 mg | ORAL_TABLET | Freq: Every day | ORAL | Status: DC

## 2014-02-11 MED ORDER — OMEPRAZOLE 20 MG PO CPDR: DELAYED_RELEASE_CAPSULE | ORAL | Status: DC

## 2014-02-11 MED ORDER — MONTELUKAST SODIUM 10 MG PO TABS: 10.0000 mg | ORAL_TABLET | Freq: Every day | ORAL | Status: DC

## 2014-02-11 NOTE — Patient Instructions (Signed)
Start Singulair 10mg  daily at bedtime to help with allergies.  We will schedule an evaluation with cardiology.  Follow up in 4 weeks.

## 2014-02-11 NOTE — Assessment & Plan Note (Signed)
Symptoms have improved. Discussed that she may have redness at site for several weeks.

## 2014-02-11 NOTE — Progress Notes (Signed)
Subjective:    Patient ID: Mary Fields, female    DOB: Mar 05, 1941, 73 y.o.   MRN: [id number]  HPI 73YO female presents for follow up.  Tick bite - Itchiness has improved. Continues to apply gentamicin. No fever, headache.  Irregular heart beat - Palpitations intermittent since last visit. No chest pain, dyspnea, diaphoresis, nausea. Labs including CBC, TSH, CMP normal. EKG was normal.  Allergies - Taking Zyrtec and using nasal steriod, Flonase or Nasocort. Notes sneezing, nasal congestion, occasional shortness of breath. Had asthma as a child. Non-smoker  Numbness left 4th and 5th fingers - Intermittent over last few weeks. Occurs without clear cause. No weakness noted. No other focal deficits noted.  Review of Systems  Constitutional: Negative for fever, chills, appetite change, fatigue and unexpected weight change.  HENT: Positive for postnasal drip, rhinorrhea and sneezing. Negative for congestion, ear pain, sinus pressure, sore throat, trouble swallowing and voice change.   Eyes: Negative for visual disturbance.  Respiratory: Positive for cough and shortness of breath. Negative for wheezing and stridor.   Cardiovascular: Positive for palpitations. Negative for chest pain and leg swelling.  Gastrointestinal: Negative for nausea, vomiting, abdominal pain, diarrhea, constipation, blood in stool, abdominal distention and anal bleeding.  Genitourinary: Negative for dysuria and flank pain.  Musculoskeletal: Negative for arthralgias, gait problem, myalgias and neck pain.  Skin: Negative for color change and rash.  Neurological: Positive for numbness. Negative for dizziness, tremors, syncope, speech difficulty, weakness and headaches.  Hematological: Negative for adenopathy. Does not bruise/bleed easily.  Psychiatric/Behavioral: Negative for suicidal ideas, sleep disturbance and dysphoric mood. The patient is not nervous/anxious.        Objective:    BP 116/70  Pulse 75  Temp(Src)  98.2 F (36.8 C) (Oral)  Wt 134 lb (60.782 kg)  SpO2 96% Physical Exam  Constitutional: She is oriented to person, place, and time. She appears well-developed and well-nourished. No distress.  HENT:  Head: Normocephalic and atraumatic.  Right Ear: External ear normal.  Left Ear: External ear normal.  Nose: Nose normal.  Mouth/Throat: Oropharynx is clear and moist. No oropharyngeal exudate.  Eyes: Conjunctivae are normal. Pupils are equal, round, and reactive to light. Right eye exhibits no discharge. Left eye exhibits no discharge. No scleral icterus.  Neck: Normal range of motion. Neck supple. No tracheal deviation present. No thyromegaly present.  Cardiovascular: Normal rate, regular rhythm, normal heart sounds and intact distal pulses.  Exam reveals no gallop and no friction rub.   No murmur heard. Pulmonary/Chest: Effort normal. No accessory muscle usage. Not tachypneic. No respiratory distress. She has decreased breath sounds (prolonged expiration). She has no wheezes. She has no rhonchi. She has no rales. She exhibits no tenderness.  Musculoskeletal: Normal range of motion. She exhibits no edema and no tenderness.  Lymphadenopathy:    She has no cervical adenopathy.  Neurological: She is alert and oriented to person, place, and time. No cranial nerve deficit. She exhibits normal muscle tone. Coordination normal.  Skin: Skin is warm and dry. No rash noted. She is not diaphoretic. No erythema. No pallor.     Psychiatric: She has a normal mood and affect. Her behavior is normal. Judgment and thought content normal.          Assessment & Plan:   Problem List Items Addressed This Visit   Allergic rhinitis - Primary     Will add singulair to zyrtec and nasal steroid. If no improvement, discussed referral for allergy testing.  Relevant Medications      montelukast (SINGULAIR) tablet 10 mg   Irregular heart beat     Continues to have intermittent palpitations. Exam normal. EKG  normal. Electrolytes, thyroid function, blood counts normal. Question if Holter monitor or stress test might be helpful for further evaluation. Will set up cardiology evaluation.    Relevant Orders      Ambulatory referral to Cardiology   Numbness of fingers     Left 4th and 5th finger intermittent numbness over several weeks. Exam normal. Suspect ulnar nerve compression. She is concerned that this numbness may be related to her heart and CAD. She does have risk for CAD including family history, however symptoms atypical for this. Will set up cardiology evaluation. If no improvement and cardiology workup unremarkable, then consider EMG testing.    Skin lesion   Relevant Orders      Ambulatory referral to Dermatology   Tick bite of groin     Symptoms have improved. Discussed that she may have redness at site for several weeks.        Return in about 4 weeks (around 03/11/2014).

## 2014-02-11 NOTE — Assessment & Plan Note (Signed)
Will add singulair to zyrtec and nasal steroid. If no improvement, discussed referral for allergy testing.

## 2014-02-11 NOTE — Assessment & Plan Note (Signed)
Continues to have intermittent palpitations. Exam normal. EKG normal. Electrolytes, thyroid function, blood counts normal. Question if Holter monitor or stress test might be helpful for further evaluation. Will set up cardiology evaluation.

## 2014-02-11 NOTE — Progress Notes (Signed)
Pre visit review using our clinic review tool, if applicable. No additional management support is needed unless otherwise documented below in the visit note. 

## 2014-02-11 NOTE — Assessment & Plan Note (Signed)
Left 4th and 5th finger intermittent numbness over several weeks. Exam normal. Suspect ulnar nerve compression. She is concerned that this numbness may be related to her heart and CAD. She does have risk for CAD including family history, however symptoms atypical for this. Will set up cardiology evaluation. If no improvement and cardiology workup unremarkable, then consider EMG testing.

## 2014-02-16 ENCOUNTER — Encounter: Payer: Self-pay | Admitting: Internal Medicine

## 2014-02-25 ENCOUNTER — Encounter: Payer: Self-pay | Admitting: Cardiovascular Disease

## 2014-02-25 ENCOUNTER — Ambulatory Visit (INDEPENDENT_AMBULATORY_CARE_PROVIDER_SITE_OTHER): Payer: Commercial Managed Care - HMO | Admitting: Cardiovascular Disease

## 2014-02-25 VITALS — BP 120/80 | HR 70 | Ht 66.0 in | Wt 133.5 lb

## 2014-02-25 DIAGNOSIS — Z87891 Personal history of nicotine dependence: Secondary | ICD-10-CM

## 2014-02-25 DIAGNOSIS — I499 Cardiac arrhythmia, unspecified: Secondary | ICD-10-CM

## 2014-02-25 NOTE — Assessment & Plan Note (Addendum)
She denies any significant shortness of breath. Cholesterol is very low, she is active, nondiabetic. The risk factors for CAD currently. She takes aspirin daily. We did discuss the symptoms of angina and she will call us for any new symptoms.

## 2014-02-25 NOTE — Patient Instructions (Signed)
You are doing well. No medication changes were made.  Please call/my chart  if you have more arrhythmia  Please call us if you have new issues that need to be addressed before your next appt.

## 2014-02-25 NOTE — Progress Notes (Signed)
Patient ID: Mary Fields, female    DOB: 06-Oct-1941, 73 y.o.   MRN: [id number]  HPI Comments: Mary Fields is a pleasant 73 year old woman with prior smoking history for 25 years, long history of palpitations dating back to 39s who presents for new patient evaluation for palpitations.  She reports that she would not be very worried if it was her typical palpitations that she's had 2 episodes where her palpitations have lasted for a longer period of time. She reports that one episode was in March, second episode in April 2013. One episode lasted for 1-1/2 minutes, second episode for less than 1 minute, possibly 30 seconds. She describes the episodes as a fast heart rate that was irregular. The first episode she was in bed, second episode she was active. Symptoms resolved without intervention. First episode she sat up in a chair symptoms seemed to resolve.  She is otherwise active, walks her dog, volunteers in the hospital, does other activities in [place]. She denies any shortness of breath or chest pain with exertion Reports having a Holter monitor, possible stress test many years ago, nothing recent  She is not a diabetic, we'll set her cholesterol is typically well controlled  EKG shows normal sinus rhythm with rate 70 beats per minute, no significant ST or T wave changes   Outpatient Encounter Prescriptions as of 02/25/2014  Medication Sig  . acyclovir (ZOVIRAX) 400 MG tablet TAKE 1 TABLET BY MOUTH 4 TIMES DAILY FOR 7 DAYS FOR OUTBREAK  . aspirin 81 MG tablet Take 81 mg by mouth every other day.   Marland Kitchen gentamicin cream (GARAMYCIN) 0.1 % Apply 1 application topically 3 (three) times daily.  . Glucosamine-Chondroitin (OSTEO BI-FLEX REGULAR STRENGTH) 250-200 MG TABS Take 1 tablet by mouth daily.    . montelukast (SINGULAIR) 10 MG tablet Take 1 tablet (10 mg total) by mouth at bedtime.  . Multiple Vitamin (MULTIVITAMIN) tablet Take 1 tablet by mouth daily.    Marland Kitchen omeprazole (PRILOSEC) 20 MG capsule TAKE  1 CAPSULE BY MOUTH EVERY DAY  . Propylene Glycol (SYSTANE BALANCE OP) 1 drop daily.   . ranitidine (ZANTAC) 300 MG tablet Take 1 tablet (300 mg total) by mouth at bedtime.     Review of Systems  Constitutional: Negative.   HENT: Negative.   Eyes: Negative.   Respiratory: Negative.   Cardiovascular: Positive for palpitations.  Gastrointestinal: Negative.   Endocrine: Negative.   Musculoskeletal: Negative.   Skin: Negative.   Allergic/Immunologic: Negative.   Neurological: Negative.   Hematological: Negative.   Psychiatric/Behavioral: Negative.   All other systems reviewed and are negative.   BP 120/80  Pulse 70  Ht 5\' 6"  (1.676 m)  Wt 133 lb 8 oz (60.555 kg)  BMI 21.56 kg/m2  Physical Exam  Nursing note and vitals reviewed. Constitutional: She is oriented to person, place, and time. She appears well-developed and well-nourished.  HENT:  Head: Normocephalic.  Nose: Nose normal.  Mouth/Throat: Oropharynx is clear and moist.  Eyes: Conjunctivae are normal. Pupils are equal, round, and reactive to light.  Neck: Normal range of motion. Neck supple. No JVD present.  Cardiovascular: Normal rate, regular rhythm, S1 normal, S2 normal, normal heart sounds and intact distal pulses.  Exam reveals no gallop and no friction rub.   No murmur heard. Pulmonary/Chest: Effort normal and breath sounds normal. No respiratory distress. She has no wheezes. She has no rales. She exhibits no tenderness.  Abdominal: Soft. Bowel sounds are normal. She exhibits no distension. There is  no tenderness.  Musculoskeletal: Normal range of motion. She exhibits no edema and no tenderness.  Lymphadenopathy:    She has no cervical adenopathy.  Neurological: She is alert and oriented to person, place, and time. Coordination normal.  Skin: Skin is warm and dry. No rash noted. No erythema.  Psychiatric: She has a normal mood and affect. Her behavior is normal. Judgment and thought content normal.     Assessment and Plan

## 2014-02-25 NOTE — Assessment & Plan Note (Signed)
Long history of palpitations, to recent episodes of more profound tachycardia. After long discussion, her symptoms are very rare and could likely be washed at this point. EKG and clinical exam is essentially benign. We have recommended if she has additional episodes, but she cough is poor Holter or 30 day monitor. Unless concerned with the short episodes, more concerned with the longer episodes. Unable to exclude other arrhythmia such as atrial fibrillation. As the symptoms were very short and she's not very symptomatic, she does not want any new medications. She will call us or use my chart to keep [place] up-to-date of her arrhythmias.

## 2014-04-07 ENCOUNTER — Encounter: Payer: Self-pay | Admitting: Internal Medicine

## 2014-04-08 ENCOUNTER — Encounter: Payer: Self-pay | Admitting: Internal Medicine

## 2014-04-08 ENCOUNTER — Ambulatory Visit (INDEPENDENT_AMBULATORY_CARE_PROVIDER_SITE_OTHER): Payer: Commercial Managed Care - HMO | Admitting: Internal Medicine

## 2014-04-08 VITALS — BP 122/74 | HR 65 | Temp 98.5°F | Ht 65.0 in | Wt 132.2 lb

## 2014-04-08 DIAGNOSIS — L259 Unspecified contact dermatitis, unspecified cause: Secondary | ICD-10-CM | POA: Insufficient documentation

## 2014-04-08 MED ORDER — DESONIDE 0.05 % EX CREA: TOPICAL_CREAM | Freq: Two times a day (BID) | CUTANEOUS | Status: DC

## 2014-04-08 NOTE — Progress Notes (Signed)
   Subjective:    Patient ID: Mary Fields, female    DOB: Sep 01, 1941, 73 y.o.   MRN: [id number]  HPI 73YO female presents for acute visit. Noticed red itchy rash over left face 2 days ago. Started applying Cordran which she had samples of. Has had minimal improvement with this. No fever, chills. Has been outdoors and her dog has also been outdoors.   Review of Systems  Constitutional: Negative for fever, chills and fatigue.  Respiratory: Negative for shortness of breath.   Cardiovascular: Negative for chest pain.  Gastrointestinal: Negative for abdominal pain.  Skin: Positive for color change and rash.       Objective:    BP 122/74  Pulse 65  Temp(Src) 98.5 F (36.9 C) (Oral)  Ht 5\' 5"  (1.651 m)  Wt 132 lb 4 oz (59.988 kg)  BMI 22.01 kg/m2  SpO2 96% Physical Exam  Constitutional: She is oriented to person, place, and time. She appears well-developed and well-nourished. No distress.  HENT:  Head: Normocephalic and atraumatic.    Eyes: Conjunctivae and EOM are normal. Pupils are equal, round, and reactive to light.  Neck: Normal range of motion.  Pulmonary/Chest: Effort normal.  Musculoskeletal: Normal range of motion.  Neurological: She is alert and oriented to person, place, and time. She has normal reflexes.  Skin: Skin is warm and dry. Rash noted. She is not diaphoretic. There is erythema.  Psychiatric: She has a normal mood and affect. Her behavior is normal. Judgment and thought content normal.          Assessment & Plan:   Problem List Items Addressed This Visit     Unprioritized   Contact dermatitis - Primary     Symptoms c/w contact dermatitis from poison oak or poison ivy. Will start topical Desonide bid x 3-4 days. Pt will call if symptoms are not improving.    Relevant Medications      desonide (DESOWEN) 0.05 % cream       Return if symptoms worsen or fail to improve.

## 2014-04-08 NOTE — Assessment & Plan Note (Signed)
Symptoms c/w contact dermatitis from poison oak or poison ivy. Will start topical Desonide bid x 3-4 days. Pt will call if symptoms are not improving.

## 2014-04-08 NOTE — Progress Notes (Signed)
Pre visit review using our clinic review tool, if applicable. No additional management support is needed unless otherwise documented below in the visit note. 

## 2014-04-08 NOTE — Patient Instructions (Signed)
       Poison Ivy Poison ivy is a inflammation of the skin (contact dermatitis) caused by touching the allergens on the leaves of the ivy plant following previous exposure to the plant. The rash usually appears 48 hours after exposure. The rash is usually bumps (papules) or blisters (vesicles) in a linear pattern. Depending on your own sensitivity, the rash may simply cause redness and itching, or it may also progress to blisters which may break open. These must be well cared for to prevent secondary bacterial (germ) infection, followed by scarring. Keep any open areas dry, clean, dressed, and covered with an antibacterial ointment if needed. The eyes may also get puffy. The puffiness is worst in the morning and gets better as the day progresses. This dermatitis usually heals without scarring, within 2 to 3 weeks without treatment. HOME CARE INSTRUCTIONS  Thoroughly wash with soap and water as soon as you have been exposed to poison ivy. You have about one half hour to remove the plant resin before it will cause the rash. This washing will destroy the oil or antigen on the skin that is causing, or will cause, the rash. Be sure to wash under your fingernails as any plant resin there will continue to spread the rash. Do not rub skin vigorously when washing affected area. Poison ivy cannot spread if no oil from the plant remains on your body. A rash that has progressed to weeping sores will not spread the rash unless you have not washed thoroughly. It is also important to wash any clothes you have been wearing as these may carry active allergens. The rash will return if you wear the unwashed clothing, even several days later. Avoidance of the plant in the future is the best measure. Poison ivy plant can be recognized by the number of leaves. Generally, poison ivy has three leaves with flowering branches on a single stem. Diphenhydramine may be purchased over the counter and used as needed for itching. Do not  drive with this medication if it makes you drowsy.Ask your caregiver about medication for children. SEEK MEDICAL CARE IF:  Open sores develop.  Redness spreads beyond area of rash.  You notice purulent (pus-like) discharge.  You have increased pain.  Other signs of infection develop (such as fever). Document Released: 10/06/2000 Document Revised: 01/01/2012 Document Reviewed: 08/25/2009 Exit[employer] Patient Information 2015 [employer]. This information is not intended to replace advice given to you by your health care provider. Make sure you discuss any questions you have with your health care provider.  

## 2014-04-09 ENCOUNTER — Ambulatory Visit: Payer: Commercial Managed Care - HMO | Admitting: Internal Medicine

## 2014-04-09 MED ORDER — TRIAMCINOLONE ACETONIDE 0.1 % EX CREA: 1.0000 "application " | TOPICAL_CREAM | Freq: Two times a day (BID) | CUTANEOUS | Status: DC

## 2014-04-13 ENCOUNTER — Encounter: Payer: Self-pay | Admitting: Internal Medicine

## 2014-04-13 DIAGNOSIS — L309 Dermatitis, unspecified: Secondary | ICD-10-CM

## 2014-05-13 ENCOUNTER — Encounter: Payer: Self-pay | Admitting: Internal Medicine

## 2014-05-13 ENCOUNTER — Telehealth: Payer: Self-pay

## 2014-05-13 DIAGNOSIS — Z135 Encounter for screening for eye and ear disorders: Secondary | ICD-10-CM

## 2014-05-13 NOTE — Telephone Encounter (Signed)
The patient has been authorized for her yearly eye exam with Dr.Dingeldein  Auth # - [id number] (6 visits authorized)   Called pt and informed her of authorization details.

## 2014-06-03 ENCOUNTER — Encounter: Payer: Self-pay | Admitting: Internal Medicine

## 2014-06-22 ENCOUNTER — Encounter: Payer: Self-pay | Admitting: Internal Medicine

## 2014-06-23 ENCOUNTER — Encounter: Payer: Self-pay | Admitting: Internal Medicine

## 2014-06-23 ENCOUNTER — Ambulatory Visit (INDEPENDENT_AMBULATORY_CARE_PROVIDER_SITE_OTHER): Payer: Commercial Managed Care - HMO | Admitting: Internal Medicine

## 2014-06-23 VITALS — BP 120/76 | HR 71 | Temp 98.2°F | Ht 65.0 in | Wt 133.0 lb

## 2014-06-23 DIAGNOSIS — L989 Disorder of the skin and subcutaneous tissue, unspecified: Secondary | ICD-10-CM

## 2014-06-23 NOTE — Patient Instructions (Signed)
We will set up an evaluation with Dr. Kellie Moor.

## 2014-06-23 NOTE — Assessment & Plan Note (Signed)
Lesion has appearance most consistent with sebaceous cyst. Will set up dermatology evaluation for possible removal.

## 2014-06-23 NOTE — Progress Notes (Signed)
   Subjective:    Patient ID: Mary Fields, female    DOB: [birth year], 73 y.o.   MRN: [id number]  HPI 73YO female presents for acute visit.  Sore on left face noted about 3 weeks ago. Draining white fluid. Mild redness after squeezing. Not painful. No topical treatments.   Review of Systems  Constitutional: Negative for fever and chills.  Skin: Positive for color change and wound.       Objective:    BP 120/76  Pulse 71  Temp(Src) 98.2 F (36.8 C) (Oral)  Ht 5\' 5"  (1.651 m)  Wt 133 lb (60.328 kg)  BMI 22.13 kg/m2  SpO2 97% Physical Exam  Constitutional: She is oriented to person, place, and time. She appears well-developed and well-nourished. No distress.  HENT:  Head: Normocephalic and atraumatic.  Right Ear: External ear normal.  Left Ear: External ear normal.  Nose: Nose normal.  Mouth/Throat: Oropharynx is clear and moist.  Eyes: Conjunctivae are normal. Pupils are equal, round, and reactive to light. Right eye exhibits no discharge. Left eye exhibits no discharge. No scleral icterus.  Neck: Normal range of motion. Neck supple. No tracheal deviation present. No thyromegaly present.  Pulmonary/Chest: Effort normal. No accessory muscle usage. Not tachypneic. She has no decreased breath sounds. She has no rhonchi.  Musculoskeletal: Normal range of motion. She exhibits no edema and no tenderness.  Lymphadenopathy:    She has no cervical adenopathy.  Neurological: She is alert and oriented to person, place, and time. No cranial nerve deficit. She exhibits normal muscle tone. Coordination normal.  Skin: Skin is warm and dry. No rash noted. She is not diaphoretic. No erythema. No pallor.     Psychiatric: She has a normal mood and affect. Her behavior is normal. Judgment and thought content normal.          Assessment & Plan:   Problem List Items Addressed This Visit     Unprioritized   Skin lesion of face - Primary     Lesion has appearance most consistent with  sebaceous cyst. Will set up dermatology evaluation for possible removal.    Relevant Orders      Ambulatory referral to Dermatology       Return if symptoms worsen or fail to improve.

## 2014-06-23 NOTE — Progress Notes (Signed)
Pre visit review using our clinic review tool, if applicable. No additional management support is needed unless otherwise documented below in the visit note. 

## 2014-08-31 ENCOUNTER — Encounter: Payer: Self-pay | Admitting: Internal Medicine

## 2014-09-07 ENCOUNTER — Encounter: Payer: Self-pay | Admitting: Internal Medicine

## 2014-09-07 ENCOUNTER — Ambulatory Visit (INDEPENDENT_AMBULATORY_CARE_PROVIDER_SITE_OTHER): Payer: Commercial Managed Care - HMO | Admitting: Internal Medicine

## 2014-09-07 VITALS — BP 112/70 | HR 83 | Temp 98.5°F | Ht 65.5 in | Wt 131.5 lb

## 2014-09-07 DIAGNOSIS — Z0000 Encounter for general adult medical examination without abnormal findings: Secondary | ICD-10-CM

## 2014-09-07 DIAGNOSIS — R05 Cough: Secondary | ICD-10-CM

## 2014-09-07 DIAGNOSIS — Z85528 Personal history of other malignant neoplasm of kidney: Secondary | ICD-10-CM | POA: Insufficient documentation

## 2014-09-07 DIAGNOSIS — R059 Cough, unspecified: Secondary | ICD-10-CM | POA: Insufficient documentation

## 2014-09-07 DIAGNOSIS — N39498 Other specified urinary incontinence: Secondary | ICD-10-CM

## 2014-09-07 DIAGNOSIS — R32 Unspecified urinary incontinence: Secondary | ICD-10-CM | POA: Insufficient documentation

## 2014-09-07 DIAGNOSIS — M79671 Pain in right foot: Secondary | ICD-10-CM

## 2014-09-07 LAB — COMPREHENSIVE METABOLIC PANEL
ALBUMIN: 4 g/dL (ref 3.5–5.2)
ALT: 11 U/L (ref 0–35)
AST: 14 U/L (ref 0–37)
Alkaline Phosphatase: 47 U/L (ref 39–117)
BUN: 22 mg/dL (ref 6–23)
CHLORIDE: 108 meq/L (ref 96–112)
CO2: 23 meq/L (ref 19–32)
Calcium: 9.3 mg/dL (ref 8.4–10.5)
Creatinine, Ser: 1 mg/dL (ref 0.4–1.2)
GFR: 60.08 mL/min (ref >60.00)
Glucose, Bld: 82 mg/dL (ref 70–99)
POTASSIUM: 4.4 meq/L (ref 3.5–5.1)
Sodium: 141 mEq/L (ref 135–145)
TOTAL PROTEIN: 6.7 g/dL (ref 6.0–8.3)
Total Bilirubin: 0.6 mg/dL (ref 0.2–1.2)

## 2014-09-07 LAB — POCT URINALYSIS DIPSTICK
BILIRUBIN UA: NEGATIVE
GLUCOSE UA: NEGATIVE
KETONES UA: NEGATIVE
Nitrite, UA: NEGATIVE
Protein, UA: NEGATIVE
SPEC GRAV UA: 1.015
Urobilinogen, UA: 0.2
pH, UA: 6.5

## 2014-09-07 LAB — LIPID PANEL
CHOL/HDL RATIO: 4
Cholesterol: 175 mg/dL (ref 0–200)
HDL: 48.7 mg/dL (ref >39.00)
LDL Cholesterol: 103 mg/dL — ABNORMAL HIGH (ref 0–99)
NonHDL: 126.3
Triglycerides: 115 mg/dL (ref 0.0–149.0)
VLDL: 23 mg/dL (ref 0.0–40.0)

## 2014-09-07 LAB — CBC WITH DIFFERENTIAL/PLATELET
BASOS ABS: 0 10*3/uL (ref 0.0–0.1)
Basophils Relative: 0.5 % (ref 0.0–3.0)
Eosinophils Absolute: 0.1 10*3/uL (ref 0.0–0.7)
Eosinophils Relative: 0.9 % (ref 0.0–5.0)
HCT: 42.6 % (ref 36.0–46.0)
HEMOGLOBIN: 14 g/dL (ref 12.0–15.0)
Lymphocytes Relative: 20 % (ref 12.0–46.0)
Lymphs Abs: 1.4 10*3/uL (ref 0.7–4.0)
MCHC: 33 g/dL (ref 30.0–36.0)
MCV: 92.3 fl (ref 78.0–100.0)
MONOS PCT: 6.5 % (ref 3.0–12.0)
Monocytes Absolute: 0.4 10*3/uL (ref 0.1–1.0)
Neutro Abs: 4.9 10*3/uL (ref 1.4–7.7)
Neutrophils Relative %: 72.1 % (ref 43.0–77.0)
PLATELETS: 240 10*3/uL (ref 150.0–400.0)
RBC: 4.61 Mil/uL (ref 3.87–5.11)
RDW: 16.8 % — ABNORMAL HIGH (ref 11.5–15.5)
WBC: 6.8 10*3/uL (ref 4.0–10.5)

## 2014-09-07 LAB — VITAMIN D 25 HYDROXY (VIT D DEFICIENCY, FRACTURES): VITD: 39.04 ng/mL (ref 30.00–100.00)

## 2014-09-07 LAB — MICROALBUMIN / CREATININE URINE RATIO
Creatinine,U: 125.4 mg/dL
MICROALB UR: 1.4 mg/dL (ref 0.0–1.9)
MICROALB/CREAT RATIO: 1.1 mg/g (ref 0.0–30.0)

## 2014-09-07 MED ORDER — ACYCLOVIR 400 MG PO TABS: 400.0000 mg | ORAL_TABLET | Freq: Four times a day (QID) | ORAL | Status: DC

## 2014-09-07 NOTE — Progress Notes (Signed)
Subjective:    Patient ID: Mary Fields, female    DOB: Jul 08, 1941, 73 y.o.   MRN: [id number]  HPI The patient is here for annual Medicare wellness examination and management of other chronic and acute problems.   The risk factors are reflected in the social history.  The roster of all physicians providing medical care to patient - is listed in the Snapshot section of the chart.   Activities of daily living:  The patient is 100% independent in all ADLs: dressing, toileting, feeding as well as independent mobility. Lives alone. Has dog in home.  Home safety : The patient has smoke detectors in the home. They wear seatbelts.  There are no firearms at home. There is no violence in the home.   There is no risks for hepatitis, STDs or HIV. There is no history of blood transfusion. They have no travel history to infectious disease endemic areas of the world.  The patient has seen their dentist in the last six month. (Dr. Sharlett Iles) They have seen their eye doctor in the last year. (Dr. Thomasene Ripple) Hearing was checked several years ago. Normal. No issues with hearing. They have deferred audiologic testing in the last year.   Dr. Richardson Landry - ENT  They do not  have excessive sun exposure. Discussed the need for sun protection: hats, long sleeves and use of sunscreen if there is significant sun exposure.   Diet: the importance of a healthy diet is discussed. They do have a relatively healthy diet.  The benefits of regular aerobic exercise were discussed. Walks daily.  Depression screen: there are no signs or vegative symptoms of depression- irritability, change in appetite, anhedonia, sadness/tearfullness.  Cognitive assessment: the patient manages all their financial and personal affairs and is actively engaged. They could relate day,date,year and events.  The following portions of the patient's history were reviewed and updated as appropriate: allergies, current medications, past family  history, past medical history,  past surgical history, past social history  and problem list.  Visual acuity was not assessed per patient preference since she has regular follow up with her ophthalmologist. Hearing and body mass index were assessed and reviewed.   During the course of the visit the patient was educated and counseled about appropriate screening and preventive services including : fall prevention , diabetes screening, nutrition counseling, colorectal cancer screening, and recommended immunizations.    ACUTE ISSUES: Woke from sleep on Friday Nov 13th with clothes soaked. Unsure if she urinated on herself. No urinary symptoms.  Right foot - concerned about bony lesion over last 2 weeks. Painful at times. Not taking anything for this. Would like referral to podiatry.  Cough - had frequent dry cough in Sept-Oct. Non-productive. CXR scheduled with Dr. Jacqlyn Larsen. Does not feel short of breath.  Review of Systems  Constitutional: Negative for fever, chills, appetite change, fatigue and unexpected weight change.  Eyes: Negative for visual disturbance.  Respiratory: Positive for cough. Negative for shortness of breath.   Cardiovascular: Negative for chest pain and leg swelling.  Gastrointestinal: Negative for nausea, vomiting, abdominal pain, diarrhea, constipation and blood in stool.  Genitourinary: Negative for dysuria, urgency and decreased urine volume.  Musculoskeletal: Positive for arthralgias.  Skin: Negative for color change and rash.  Hematological: Negative for adenopathy. Does not bruise/bleed easily.  Psychiatric/Behavioral: Negative for suicidal ideas, sleep disturbance and dysphoric mood. The patient is not nervous/anxious.        Objective:    BP 112/70 mmHg  Pulse 83  Temp(Src) 98.5 F (36.9 C) (Oral)  Ht 5' 5.5" (1.664 m)  Wt 131 lb 8 oz (59.648 kg)  BMI 21.54 kg/m2  SpO2 96% Physical Exam  Constitutional: She is oriented to person, place, and time. She appears  well-developed and well-nourished. No distress.  HENT:  Head: Normocephalic and atraumatic.  Right Ear: External ear normal.  Left Ear: External ear normal.  Nose: Nose normal.  Mouth/Throat: Oropharynx is clear and moist. No oropharyngeal exudate.  Eyes: Conjunctivae and EOM are normal. Pupils are equal, round, and reactive to light. Right eye exhibits no discharge. Left eye exhibits no discharge. No scleral icterus.  Neck: Normal range of motion. Neck supple. No tracheal deviation present. No thyromegaly present.  Cardiovascular: Normal rate, regular rhythm, normal heart sounds and intact distal pulses.  Exam reveals no gallop and no friction rub.   No murmur heard. Pulmonary/Chest: Effort normal and breath sounds normal. No accessory muscle usage. No tachypnea. No respiratory distress. She has no decreased breath sounds. She has no wheezes. She has no rhonchi. She has no rales. She exhibits no tenderness.  Abdominal: Soft. Bowel sounds are normal. She exhibits no distension and no mass. There is no tenderness. There is no rebound and no guarding.  Musculoskeletal: Normal range of motion. She exhibits no edema or tenderness.       Feet:  Lymphadenopathy:    She has no cervical adenopathy.  Neurological: She is alert and oriented to person, place, and time. No cranial nerve deficit. She exhibits normal muscle tone. Coordination normal.  Skin: Skin is warm and dry. No rash noted. She is not diaphoretic. No erythema. No pallor.  Psychiatric: She has a normal mood and affect. Her behavior is normal. Judgment and thought content normal.          Assessment & Plan:   Problem List Items Addressed This Visit      Unprioritized   Cough    Likely viral URI that has now resolved. Exam normal. Will get CXR with Dr. Jacqlyn Larsen.    History of renal cell cancer    Follow up with urology, Dr. Jacqlyn Larsen as scheduled.    Relevant Orders      Ambulatory referral to Urology   Medicare annual wellness  visit, subsequent - Primary    General medical exam normal today. Pt declines mammogram. Pt declines colonoscopy. Immunizations are UTD. Appropriate screening performed. Labs today including CBC, CMP, lipids. Follow up in 49months and prn.    Relevant Orders      CBC with Differential      Comprehensive metabolic panel      Lipid panel      Microalbumin / creatinine urine ratio      Vit D  25 hydroxy (rtn osteoporosis monitoring)      POCT Urinalysis Dipstick   Right foot pain    Right lateral foot pain. Protrusion noted lateral medial foot. Suspect neuroma. Will set up podiatry evaluation for imaging and management.    Relevant Orders      Ambulatory referral to Podiatry   Urinary incontinence    Recent episode of urinary incontinence during sleep. Will check UA with labs today.        Return in about 6 months (around 03/08/2015) for Recheck.

## 2014-09-07 NOTE — Patient Instructions (Signed)

## 2014-09-07 NOTE — Assessment & Plan Note (Signed)
Likely viral URI that has now resolved. Exam normal. Will get CXR with Dr. Jacqlyn Larsen.

## 2014-09-07 NOTE — Assessment & Plan Note (Signed)
Right lateral foot pain. Protrusion noted lateral medial foot. Suspect neuroma. Will set up podiatry evaluation for imaging and management.

## 2014-09-07 NOTE — Progress Notes (Signed)
Pre visit review using our clinic review tool, if applicable. No additional management support is needed unless otherwise documented below in the visit note. 

## 2014-09-07 NOTE — Assessment & Plan Note (Signed)
General medical exam normal today. Pt declines mammogram. Pt declines colonoscopy. Immunizations are UTD. Appropriate screening performed. Labs today including CBC, CMP, lipids. Follow up in 21months and prn.

## 2014-09-07 NOTE — Addendum Note (Signed)
Addended by: Karlene Einstein D on: 09/07/2014 09:30 AM   Modules accepted: Orders

## 2014-09-07 NOTE — Assessment & Plan Note (Signed)
Recent episode of urinary incontinence during sleep. Will check UA with labs today.

## 2014-09-07 NOTE — Assessment & Plan Note (Signed)
Follow up with urology, Dr. Jacqlyn Larsen as scheduled.

## 2014-09-08 LAB — URINE CULTURE
COLONY COUNT: NO GROWTH
Organism ID, Bacteria: NO GROWTH

## 2014-09-13 ENCOUNTER — Encounter: Payer: Self-pay | Admitting: Internal Medicine

## 2014-09-15 ENCOUNTER — Encounter: Payer: Self-pay | Admitting: Internal Medicine

## 2014-09-16 ENCOUNTER — Encounter: Payer: Self-pay | Admitting: Nurse Practitioner

## 2014-09-16 ENCOUNTER — Ambulatory Visit: Payer: Commercial Managed Care - HMO | Admitting: Nurse Practitioner

## 2014-09-16 ENCOUNTER — Ambulatory Visit (INDEPENDENT_AMBULATORY_CARE_PROVIDER_SITE_OTHER): Payer: Commercial Managed Care - HMO | Admitting: Nurse Practitioner

## 2014-09-16 VITALS — BP 122/68 | HR 68 | Temp 98.0°F | Resp 14 | Ht 65.5 in | Wt 128.8 lb

## 2014-09-16 DIAGNOSIS — J019 Acute sinusitis, unspecified: Secondary | ICD-10-CM | POA: Insufficient documentation

## 2014-09-16 NOTE — Progress Notes (Signed)
Subjective:    Patient ID: Mary Fields, female    DOB: [birth year], 73 y.o.   MRN: [id number]  HPI  Mary Fields is a 73 yo female here with a CC of cough x 5 days.   1) Symptoms began 09/12/14 as a sore throat. She progressed to cough, sneezing, rhinitis, and currently symptoms have improved. States she felt bad. Treatment to date:  Mucinex Sinus Max- helpful; Robitussin Cough/Chest/cong DM- first dose last night, helpful [facility] antihistamine- taking it in evenings, helpful Advil- took every 4 hours, helped short term. Last dose 09/14/14 Theraflu- Did not like and threw away Naps are helpful, feels "wiped out".   BP 122/68 mmHg  Pulse 68  Temp(Src) 98 F (36.7 C) (Oral)  Resp 14  Ht 5' 5.5" (1.664 m)  Wt 128 lb 12 oz (58.401 kg)  BMI 21.09 kg/m2  SpO2 97%    Review of Systems  Constitutional: Positive for fatigue. Negative for fever, chills, diaphoresis and unexpected weight change.  HENT: Positive for congestion, postnasal drip, rhinorrhea and sinus pressure. Negative for ear discharge, ear pain, sneezing and sore throat.   Eyes: Negative for discharge, itching and visual disturbance.  Respiratory: Positive for wheezing. Negative for chest tightness and shortness of breath.   Cardiovascular: Negative for chest pain, palpitations and leg swelling.  Gastrointestinal: Negative for nausea, vomiting, abdominal pain and diarrhea.  Genitourinary: Negative for dysuria.  Musculoskeletal: Negative for myalgias.  Skin: Negative for rash.  Allergic/Immunologic: Positive for environmental allergies.  Neurological: Negative for headaches.   Past Medical History  Diagnosis Date  . Other malaise and fatigue   . Benign neoplasm of skin, site unspecified     Dr. Aubery Lapping  . 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 Dizziness and giddiness   . Palpitations   . Herpes simplex without mention of complication   . Ulcerative colitis, unspecified   .  Malignant neoplasm of kidney, except pelvis   . Disorder of bone and cartilage, unspecified   . Esophageal reflux   . Personal history of other malignant neoplasm of skin   . Personal history of malignant neoplasm of breast   . Hx-TIA (transient ischemic attack)   . Atypical nevi   . Diverticulosis   . Erosive esophagitis   . Colitis   . Unspecified asthma(493.90)     since childhood  . Hip fracture 11/12    surgery    History   Social History  . Marital Status: Widowed    Spouse Name: N/A    Number of Children: N/A  . Years of Education: N/A   Occupational History  . Not on file.   Social History Main Topics  . Smoking status: Former Smoker    Quit date: 08/02/1984  . Smokeless tobacco: Not on file  . Alcohol Use: Yes     Comment: occassionaly  . Drug Use: No  . Sexual Activity: Not on file   Other Topics Concern  . Not on file   Social History Narrative   Widowed. Lives in [place].      No children      Works in healthcare, Engineer, agricultural    Past Surgical History  Procedure Laterality Date  . Melanoma excision  1980    Dr. Sharlet Salina  . Nephrectomy  2000    left renal cell carcinoma, Dr. Jacqlyn Larsen  . Hip fracture surgery  2005    hemiarthroplasty right, Dr. Francia Greaves  . Esophagogastroduodenoscopy  2002    esophagitis; grade 1, erosive  . Colonoscopy  4/10    diverticulosis; recheck 5 years  . Appendectomy    . Abdominal hysterectomy  1986    endometriosis, Dr. Pearletha Furl  . Breast lumpectomy  1995    right breast cancer, lumpectomy and XRT and chemo  . Hip fracture surgery  11/12    after fall at church    Family History  Problem Relation Age of Onset  . Heart disease Paternal Grandmother   . Hypertension Paternal Grandmother   . Stomach cancer Maternal Grandmother   . Kidney disease Maternal Uncle   . Cancer Paternal Aunt     breast cancer    Allergies  Allergen Reactions  . Ciprofloxacin     REACTION: diarrhea  . Sulfonamide Derivatives      REACTION: whelps    Current Outpatient Prescriptions on File Prior to Visit  Medication Sig Dispense Refill  . acyclovir (ZOVIRAX) 400 MG tablet Take 1 tablet (400 mg total) by mouth 4 (four) times daily. (Patient taking differently: Take 400 mg by mouth 4 (four) times daily as needed. ) 360 tablet 1  . aspirin 81 MG tablet Take 81 mg by mouth every other day.     . Glucosamine-Chondroitin (OSTEO BI-FLEX REGULAR STRENGTH) 250-200 MG TABS Take 1 tablet by mouth daily.      . Multiple Vitamin (MULTIVITAMIN) tablet Take 1 tablet by mouth daily.      Marland Kitchen omeprazole (PRILOSEC) 20 MG capsule TAKE 1 CAPSULE BY MOUTH EVERY DAY 90 capsule 3  . Propylene Glycol (SYSTANE BALANCE OP) 1 drop daily.     . ranitidine (ZANTAC) 300 MG tablet Take 1 tablet (300 mg total) by mouth at bedtime. 90 tablet 3   No current facility-administered medications on file prior to visit.         Objective:   Physical Exam  Constitutional: She is oriented to person, place, and time. She appears well-developed and well-nourished. No distress.  HENT:  Head: Normocephalic and atraumatic.  Right Ear: External ear normal.  Left Ear: External ear normal.  Mouth/Throat: Oropharynx is clear and moist.  Pt has clear TM/Canals bilaterally, Clear rhinorrhea  Eyes: Conjunctivae and EOM are normal. Pupils are equal, round, and reactive to light. Right eye exhibits no discharge. Left eye exhibits no discharge. No scleral icterus.  Neck: Normal range of motion. Neck supple. No thyromegaly present.  Cardiovascular: Normal rate and regular rhythm.   Pulmonary/Chest: Effort normal and breath sounds normal. No respiratory distress. She has no wheezes. She has no rales. She exhibits no tenderness.  Lymphadenopathy:    She has no cervical adenopathy.  Neurological: She is alert and oriented to person, place, and time. She displays normal reflexes.  Skin: Skin is warm and dry. No rash noted. She is not diaphoretic. No erythema.    Psychiatric: She has a normal mood and affect. Her behavior is normal. Judgment and thought content normal.          Assessment & Plan:

## 2014-09-16 NOTE — Progress Notes (Signed)
Pre visit review using our clinic review tool, if applicable. No additional management support is needed unless otherwise documented below in the visit note. 

## 2014-09-16 NOTE — Patient Instructions (Signed)
Continue with the Mucinex Sinus during the day, Robitussin DM in the evening, Anti-histamine before bedtime to dry up secretions Call us if any of the symptoms discussed appear   Viral Infections A viral infection can be caused by different types of viruses.Most viral infections are not serious and resolve on their own. However, some infections may cause severe symptoms and may lead to further complications. SYMPTOMS Viruses can frequently cause:  Minor sore throat.  Aches and pains.  Headaches.  Runny nose.  Different types of rashes.  Watery eyes.  Tiredness.  Cough.  Loss of appetite.  Gastrointestinal infections, resulting in nausea, vomiting, and diarrhea. These symptoms do not respond to antibiotics because the infection is not caused by bacteria. However, you might catch a bacterial infection following the viral infection. This is sometimes called a "superinfection." Symptoms of such a bacterial infection may include:  Worsening sore throat with pus and difficulty swallowing.  Swollen neck glands.  Chills and a high or persistent fever.  Severe headache.  Tenderness over the sinuses.  Persistent overall ill feeling (malaise), muscle aches, and tiredness (fatigue).  Persistent cough.  Yellow, green, or brown mucus production with coughing. HOME CARE INSTRUCTIONS   Only take over-the-counter or prescription medicines for pain, discomfort, diarrhea, or fever as directed by your caregiver.  Drink enough water and fluids to keep your urine clear or pale yellow. Sports drinks can provide valuable electrolytes, sugars, and hydration.  Get plenty of rest and maintain proper nutrition. Soups and broths with crackers or rice are fine. SEEK IMMEDIATE MEDICAL CARE IF:   You have severe headaches, shortness of breath, chest pain, neck pain, or an unusual rash.  You have uncontrolled vomiting, diarrhea, or you are unable to keep down fluids.  You or your child has  an oral temperature above 102 F (38.9 C), not controlled by medicine.  Your baby is older than 3 months with a rectal temperature of 102 F (38.9 C) or higher.  Your baby is 95 months old or younger with a rectal temperature of 100.4 F (38 C) or higher. MAKE SURE YOU:   Understand these instructions.  Will watch your condition.  Will get help right away if you are not doing well or get worse. Document Released: 07/19/2005 Document Revised: 01/01/2012 Document Reviewed: 02/13/2011 [facility] Patient Information 2015 [place], [place]. This information is not intended to replace advice given to you by your health care provider. Make sure you discuss any questions you have with your health care provider.

## 2014-09-16 NOTE — Assessment & Plan Note (Signed)
Stable. Acute Viral Rhinosinusitis with allergic component. Feeling better today. Continue OTC meds of Mucinex Sinus, Robitussin DM, and [facility] brand antihistamine until well. Gave handout on viral infections. Discussed calling us if worsening symptoms or symptoms of infection.

## 2014-12-14 ENCOUNTER — Encounter: Payer: Self-pay | Admitting: Internal Medicine

## 2014-12-14 NOTE — Telephone Encounter (Signed)
FYI

## 2014-12-14 NOTE — Telephone Encounter (Signed)
[name], Can you make sure these referrals are in place?

## 2015-01-14 ENCOUNTER — Encounter: Payer: Self-pay | Admitting: Internal Medicine

## 2015-01-14 ENCOUNTER — Telehealth: Payer: Self-pay

## 2015-01-14 NOTE — Telephone Encounter (Signed)
See email dated 12/14/14 for other referrals needed

## 2015-01-14 NOTE — Telephone Encounter (Signed)
The patient is needing a [facility] ref for her eye apt - her apt is April 27th

## 2015-01-19 ENCOUNTER — Encounter: Payer: Self-pay | Admitting: Internal Medicine

## 2015-01-19 ENCOUNTER — Ambulatory Visit (INDEPENDENT_AMBULATORY_CARE_PROVIDER_SITE_OTHER): Payer: Commercial Managed Care - HMO | Admitting: Internal Medicine

## 2015-01-19 VITALS — BP 113/73 | HR 79 | Temp 98.0°F | Ht 65.5 in | Wt 133.0 lb

## 2015-01-19 DIAGNOSIS — R102 Pelvic and perineal pain: Secondary | ICD-10-CM | POA: Insufficient documentation

## 2015-01-19 NOTE — Assessment & Plan Note (Signed)
Vaginal pain noted by pt with possible obstruction. She has extreme tenderness with even light palpation of introitus and I am unable to pass a small speculum to examine her. Will set up GYN evaluation. May need exam with pediatric speculum and/or exam under anesthesia.

## 2015-01-19 NOTE — Patient Instructions (Signed)
We will set up an evaluation with Dr. Marcelline Mates.  Follow up here in 4 weeks or sooner as needed.

## 2015-01-19 NOTE — Progress Notes (Signed)
   Subjective:    Patient ID: Mary Fields, female    DOB: 1941-06-27, 74 y.o.   MRN: [id number]  HPI 74YO female presents for acute visit.  Vaginal discharge - Chronic discharge for years. Uses Replens wash. When washing with inserted catheter, she was not able to pass into the vagina. No pelvic pain. No urinary symptoms.   Past medical, surgical, family and social history per today's encounter.  Review of Systems  Constitutional: Negative for fever, chills, appetite change, fatigue and unexpected weight change.  Eyes: Negative for visual disturbance.  Respiratory: Negative for shortness of breath.   Cardiovascular: Negative for chest pain and leg swelling.  Gastrointestinal: Negative for abdominal pain.  Genitourinary: Positive for vaginal discharge. Negative for dysuria, urgency, frequency, flank pain, vaginal bleeding, difficulty urinating, genital sores, vaginal pain and pelvic pain.  Skin: Negative for color change and rash.  Hematological: Negative for adenopathy. Does not bruise/bleed easily.  Psychiatric/Behavioral: Negative for sleep disturbance and dysphoric mood. The patient is not nervous/anxious.        Objective:    BP 113/73 mmHg  Pulse 79  Temp(Src) 98 F (36.7 C) (Oral)  Ht 5' 5.5" (1.664 m)  Wt 133 lb (60.328 kg)  BMI 21.79 kg/m2  SpO2 95% Physical Exam  Constitutional: She is oriented to person, place, and time. She appears well-developed and well-nourished. No distress.  HENT:  Head: Normocephalic and atraumatic.  Right Ear: External ear normal.  Left Ear: External ear normal.  Nose: Nose normal.  Mouth/Throat: Oropharynx is clear and moist.  Eyes: Conjunctivae are normal. Pupils are equal, round, and reactive to light. Right eye exhibits no discharge. Left eye exhibits no discharge. No scleral icterus.  Neck: Normal range of motion. Neck supple. No tracheal deviation present. No thyromegaly present.  Pulmonary/Chest: Effort normal. No accessory  muscle usage. No tachypnea. She has no decreased breath sounds. She has no rhonchi.  Genitourinary: There is tenderness (extreme tenderness with even light palpation at introitus. unable to pass small speculum for exam) in the vagina. No erythema or bleeding in the vagina. No foreign body around the vagina. No signs of injury around the vagina. No vaginal discharge found.  Musculoskeletal: Normal range of motion. She exhibits no edema or tenderness.  Lymphadenopathy:    She has no cervical adenopathy.  Neurological: She is alert and oriented to person, place, and time. No cranial nerve deficit. She exhibits normal muscle tone. Coordination normal.  Skin: Skin is warm and dry. No rash noted. She is not diaphoretic. No erythema. No pallor.  Psychiatric: She has a normal mood and affect. Her behavior is normal. Judgment and thought content normal.          Assessment & Plan:  Over 41min of which >50% spent in face-to-face contact with patient discussing plan of care  Problem List Items Addressed This Visit      Unprioritized   Vaginal pain - Primary    Vaginal pain noted by pt with possible obstruction. She has extreme tenderness with even light palpation of introitus and I am unable to pass a small speculum to examine her. Will set up GYN evaluation. May need exam with pediatric speculum and/or exam under anesthesia.      Relevant Orders   Ambulatory referral to Gynecology       Return in about 4 weeks (around 02/16/2015) for Recheck.

## 2015-01-19 NOTE — Progress Notes (Signed)
Pre visit review using our clinic review tool, if applicable. No additional management support is needed unless otherwise documented below in the visit note. 

## 2015-02-23 ENCOUNTER — Encounter: Payer: Self-pay | Admitting: Internal Medicine

## 2015-02-23 ENCOUNTER — Other Ambulatory Visit: Payer: Self-pay | Admitting: *Deleted

## 2015-02-23 MED ORDER — RANITIDINE HCL 300 MG PO TABS: 300.0000 mg | ORAL_TABLET | Freq: Every day | ORAL | Status: DC

## 2015-04-12 ENCOUNTER — Ambulatory Visit (INDEPENDENT_AMBULATORY_CARE_PROVIDER_SITE_OTHER): Payer: Commercial Managed Care - HMO | Admitting: Internal Medicine

## 2015-04-12 ENCOUNTER — Encounter: Payer: Self-pay | Admitting: Internal Medicine

## 2015-04-12 VITALS — BP 120/82 | HR 91 | Temp 98.4°F | Wt 134.0 lb

## 2015-04-12 DIAGNOSIS — L255 Unspecified contact dermatitis due to plants, except food: Secondary | ICD-10-CM | POA: Diagnosis not present

## 2015-04-12 MED ORDER — PREDNISONE 10 MG PO TABS: ORAL_TABLET | ORAL | Status: DC

## 2015-04-12 NOTE — Patient Instructions (Signed)

## 2015-04-12 NOTE — Progress Notes (Signed)
Subjective:    Patient ID: Mary Fields, female    DOB: 08-May-1941, 74 y.o.   MRN: [id number]  HPI  Pt presents to the clinic today with c/o a rash. This started 1 week ago. It is located on her face and arms. The rash is very itchy. She thinks it is poison ivy. She reports that she is very allergic to it and she seems to get it every year. She has tried Cortizone 10, Triamcinolone cream and Benadryl with minimal relief. She denis changes in soaps, lotions or detergents.  Review of Systems      Past Medical History  Diagnosis Date  . Other malaise and fatigue   . Benign neoplasm of skin, site unspecified     Dr. Aubery Lapping  . 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 Dizziness and giddiness   . Palpitations   . Herpes simplex without mention of complication   . Ulcerative colitis, unspecified   . Malignant neoplasm of kidney, except pelvis   . Disorder of bone and cartilage, unspecified   . Esophageal reflux   . Personal history of other malignant neoplasm of skin   . Personal history of malignant neoplasm of breast   . Hx-TIA (transient ischemic attack)   . Atypical nevi   . Diverticulosis   . Erosive esophagitis   . Colitis   . Unspecified asthma(493.90)     since childhood  . Hip fracture 11/12    surgery    Current Outpatient Prescriptions  Medication Sig Dispense Refill  . aspirin 81 MG tablet Take 81 mg by mouth every other day.     Marland Kitchen omeprazole (PRILOSEC) 20 MG capsule TAKE 1 CAPSULE BY MOUTH EVERY DAY 90 capsule 3  . ranitidine (ZANTAC) 300 MG tablet Take 1 tablet (300 mg total) by mouth at bedtime. 90 tablet 3  . acyclovir (ZOVIRAX) 400 MG tablet Take 1 tablet (400 mg total) by mouth 4 (four) times daily. (Patient not taking: Reported on 04/12/2015) 360 tablet 1  . Glucosamine-Chondroitin (OSTEO BI-FLEX REGULAR STRENGTH) 250-200 MG TABS Take 1 tablet by mouth daily.      . Multiple Vitamin (MULTIVITAMIN) tablet Take 1 tablet by  mouth daily.      . predniSONE (DELTASONE) 10 MG tablet Take 3 tabs on days 1-2, take 2 tabs on days 3-4, take 1 tab on days 5-6 12 tablet 0  . Propylene Glycol (SYSTANE BALANCE OP) 1 drop daily.      No current facility-administered medications for this visit.    Allergies  Allergen Reactions  . Ciprofloxacin     REACTION: diarrhea  . Sulfonamide Derivatives     REACTION: whelps    Family History  Problem Relation Age of Onset  . Heart disease Paternal Grandmother   . Hypertension Paternal Grandmother   . Stomach cancer Maternal Grandmother   . Kidney disease Maternal Uncle   . Cancer Paternal Aunt     breast cancer    History   Social History  . Marital Status: Widowed    Spouse Name: N/A  . Number of Children: N/A  . Years of Education: N/A   Occupational History  . Not on file.   Social History Main Topics  . Smoking status: Former Smoker    Quit date: 08/02/1984  . Smokeless tobacco: Not on file  . Alcohol Use: Yes     Comment: occassionaly  . Drug Use: No  . Sexual Activity: Not on  file   Other Topics Concern  . Not on file   Social History Narrative   Widowed. Lives in [place].      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  Skin: Pt reports rash. Denies lesions or ulceration. Respiratory: Denies difficulty breathing, shortness of breath, cough or sputum production.   Cardiovascular: Denies chest pain, chest tightness, palpitations or swelling in the hands or feet.    No other specific complaints in a complete review of systems (except as listed in HPI above).  Objective:   Physical Exam   BP 120/82 mmHg  Pulse 91  Temp(Src) 98.4 F (36.9 C) (Oral)  Wt 134 lb (60.782 kg)  SpO2 98% Wt Readings from Last 3 Encounters:  04/12/15 134 lb (60.782 kg)  01/19/15 133 lb (60.328 kg)  09/16/14 128 lb 12 oz (58.401 kg)    General: Appears her  stated age, well developed, well nourished in NAD. Skin: Scattered vesicular lesions noted on face and right hand. Cardiovascular: Normal rate and rhythm. S1,S2 noted.  No murmur, rubs or gallops noted. No JVD or BLE edema. No carotid bruits noted. Pulmonary/Chest: Normal effort and positive vesicular breath sounds. No respiratory distress. No wheezes, rales or ronchi noted.    BMET    Component Value Date/Time   NA 141 09/07/2014 0906   NA 139 08/02/2012 0849   K 4.4 09/07/2014 0906   CL 108 09/07/2014 0906   CO2 23 09/07/2014 0906   GLUCOSE 82 09/07/2014 0906   GLUCOSE 84 08/02/2012 0849   BUN 22 09/07/2014 0906   BUN 15 08/02/2012 0849   CREATININE 1.0 09/07/2014 0906   CALCIUM 9.3 09/07/2014 0906   GFRNONAA 58* 08/02/2012 0849   GFRAA 67 08/02/2012 0849    Lipid Panel     Component Value Date/Time   CHOL 175 09/07/2014 0906   CHOL 158 08/02/2012 0849   TRIG 115.0 09/07/2014 0906   HDL 48.70 09/07/2014 0906   HDL 59 08/02/2012 0849   CHOLHDL 4 09/07/2014 0906   VLDL 23.0 09/07/2014 0906   LDLCALC 103* 09/07/2014 0906   LDLCALC 72 08/02/2012 0849    CBC    Component Value Date/Time   WBC 6.8 09/07/2014 0906   RBC 4.61 09/07/2014 0906   HGB 14.0 09/07/2014 0906   HCT 42.6 09/07/2014 0906   PLT 240.0 09/07/2014 0906   MCV 92.3 09/07/2014 0906   MCHC 33.0 09/07/2014 0906   RDW 16.8* 09/07/2014 0906   LYMPHSABS 1.4 09/07/2014 0906   MONOABS 0.4 09/07/2014 0906   EOSABS 0.1 09/07/2014 0906   BASOSABS 0.0 09/07/2014 0906    Hgb A1C No results found for: HGBA1C      Assessment & Plan:   Contact Dermatitis due to plant:  eRx for pred taper x 6 days Continue using triamcinolone cream  Call be Friday if symptoms persist or worsen

## 2015-04-15 ENCOUNTER — Encounter: Payer: Self-pay | Admitting: Internal Medicine

## 2015-04-16 ENCOUNTER — Telehealth: Payer: Self-pay | Admitting: Primary Care

## 2015-04-16 ENCOUNTER — Ambulatory Visit (INDEPENDENT_AMBULATORY_CARE_PROVIDER_SITE_OTHER): Payer: Commercial Managed Care - HMO | Admitting: Primary Care

## 2015-04-16 ENCOUNTER — Encounter: Payer: Self-pay | Admitting: Primary Care

## 2015-04-16 VITALS — Ht 65.5 in | Wt 139.0 lb

## 2015-04-16 DIAGNOSIS — T7840XA Allergy, unspecified, initial encounter: Secondary | ICD-10-CM

## 2015-04-16 MED ORDER — EPINEPHRINE 0.3 MG/0.3ML IJ SOAJ: 0.3000 mg | Freq: Once | INTRAMUSCULAR | Status: DC

## 2015-04-16 NOTE — Telephone Encounter (Signed)
I left a message for the patient to return my call.

## 2015-04-16 NOTE — Patient Instructions (Signed)
You likely had an allergic reaction to the prednisone.  You may take Zyrtec during the day an Benadryl at night for itching. Try taking Pepcid OTC for symptoms of itching and rash. Stop using steroid creams and taking prednisone.   Call if you develop any additional rash or worsening itching. Go to the emergency department if you develop any tightness to your throat.  It was very nice to meet you!

## 2015-04-16 NOTE — Progress Notes (Signed)
Subjective:    Patient ID: EGYPT WELCOME, female    DOB: Mar 18, 1941, 74 y.o.   MRN: [id number]  HPI  Ms. Dech is a 74 year old female who presents today with a chief complaint of allergic reaction. She was evaluated on 6/20 for contact dermatitis due to poison ivy contact. She was provided with a prednisone taper for itching and encouraged to continue use of triamcinolone cream. On 6/23 she reports an increase in her itching.  She presents today with resolve of her poison ivy. She noticed several small bumps to her bilateral upper extremities 2-3 days ago. Yesterday afternoon she went to [employer] and felt burning to her underarms and noticed the rash to her axilla. She took 2 pills of prednisone last night at 7pm. Last night at 8pm she experienced whelps to her upper and lower extremities with significant itching. She believes she had a reaction to the prednisone. She took benadryl at 9pm with improvement. She took 2 benadryl at 7am this morning and another at 12 pm. She has not had any additional doses of her prednisone.   She's had no change in her soaps, laundry detergent, house hold chemicals. She denies SOB, sensations of her throat closing, or additional hives.  Review of Systems  Constitutional: Negative for fever.  HENT: Negative for sore throat and trouble swallowing.   Respiratory: Negative for cough, shortness of breath and wheezing.   Cardiovascular: Negative for chest pain.  Skin: Positive for color change and rash.       Past Medical History  Diagnosis Date  . Other malaise and fatigue   . Benign neoplasm of skin, site unspecified     Dr. Aubery Lapping  . 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 Dizziness and giddiness   . Palpitations   . Herpes simplex without mention of complication   . Ulcerative colitis, unspecified   . Malignant neoplasm of kidney, except pelvis   . Disorder of bone and cartilage, unspecified   . Esophageal reflux     . Personal history of other malignant neoplasm of skin   . Personal history of malignant neoplasm of breast   . Hx-TIA (transient ischemic attack)   . Atypical nevi   . Diverticulosis   . Erosive esophagitis   . Colitis   . Unspecified asthma(493.90)     since childhood  . Hip fracture 11/12    surgery    History   Social History  . Marital Status: Widowed    Spouse Name: N/A  . Number of Children: N/A  . Years of Education: N/A   Occupational History  . Not on file.   Social History Main Topics  . Smoking status: Former Smoker    Quit date: 08/02/1984  . Smokeless tobacco: Not on file  . Alcohol Use: Yes     Comment: occassionaly  . Drug Use: No  . Sexual Activity: Not on file   Other Topics Concern  . Not on file   Social History Narrative   Widowed. Lives in [place].      No children      Works in healthcare, Engineer, agricultural    Past Surgical History  Procedure Laterality Date  . Melanoma excision  1980    Dr. Sharlet Salina  . Nephrectomy  2000    left renal cell carcinoma, Dr. Jacqlyn Larsen  . Hip fracture surgery  2005    hemiarthroplasty right, Dr. Francia Greaves  . Esophagogastroduodenoscopy  2002  esophagitis; grade 1, erosive  . Colonoscopy  4/10    diverticulosis; recheck 5 years  . Appendectomy    . Abdominal hysterectomy  1986    endometriosis, Dr. Pearletha Furl  . Breast lumpectomy  1995    right breast cancer, lumpectomy and XRT and chemo  . Hip fracture surgery  11/12    after fall at church    Family History  Problem Relation Age of Onset  . Heart disease Paternal Grandmother   . Hypertension Paternal Grandmother   . Stomach cancer Maternal Grandmother   . Kidney disease Maternal Uncle   . Cancer Paternal Aunt     breast cancer    Allergies  Allergen Reactions  . Ciprofloxacin     REACTION: diarrhea  . Prednisone Hives  . Sulfonamide Derivatives     REACTION: whelps    Current Outpatient Prescriptions on File Prior to Visit  Medication Sig  Dispense Refill  . aspirin 81 MG tablet Take 81 mg by mouth every other day.     . Glucosamine-Chondroitin (OSTEO BI-FLEX REGULAR STRENGTH) 250-200 MG TABS Take 1 tablet by mouth daily.      . Multiple Vitamin (MULTIVITAMIN) tablet Take 1 tablet by mouth daily.      Marland Kitchen omeprazole (PRILOSEC) 20 MG capsule TAKE 1 CAPSULE BY MOUTH EVERY DAY 90 capsule 3  . Propylene Glycol (SYSTANE BALANCE OP) 1 drop daily.     . ranitidine (ZANTAC) 300 MG tablet Take 1 tablet (300 mg total) by mouth at bedtime. 90 tablet 3  . acyclovir (ZOVIRAX) 400 MG tablet Take 1 tablet (400 mg total) by mouth 4 (four) times daily. (Patient not taking: Reported on 04/16/2015) 360 tablet 1   No current facility-administered medications on file prior to visit.    Ht 5' 5.5" (1.664 m)  Wt 139 lb (63.05 kg)  BMI 22.77 kg/m2     Objective:   Physical Exam  Constitutional: She appears well-nourished. No distress.  Cardiovascular: Normal rate and regular rhythm.   Pulmonary/Chest: Effort normal and breath sounds normal. She has no wheezes.  Skin:  One faint wheal present to right anterior upper arm distal to axilla. No other rash present.           Assessment & Plan:  Allergic reaction:  Suspect allergic reaction to prednisone as she started developing symptoms of an allergic reaction after starting this medication. She has not had any prednisone since yesterday. Stop prednisone. Added to allergy list as this can cause "urticaria" mentioned in up-to-date. Continue benadryl at night. May substitute. Zyrtec during the day. Add OTC Pepcid as H2 blocker. RX for epi pen sent to pharmacy in case of any reaction that may occur this weekend if prednisone was not the cause. ER precautions provided for anaphylaxis.

## 2015-04-16 NOTE — Telephone Encounter (Signed)
Please notify Mary Fields that I will be sending in an Epi Pen for her to keep on hand. This is to be used only if she develops another reaction. Thanks.

## 2015-04-16 NOTE — Progress Notes (Signed)
Pre visit review using our clinic review tool, if applicable. No additional management support is needed unless otherwise documented below in the visit note. 

## 2015-04-19 NOTE — Telephone Encounter (Signed)
I left a message for the patient to return my call.

## 2015-04-19 NOTE — Telephone Encounter (Signed)
Patient called back. Notified patient of Kate's comments. Patient verbalized understanding and decline the Epi Pen.

## 2015-05-12 ENCOUNTER — Encounter: Payer: Self-pay | Admitting: Obstetrics and Gynecology

## 2015-05-12 ENCOUNTER — Ambulatory Visit (INDEPENDENT_AMBULATORY_CARE_PROVIDER_SITE_OTHER): Payer: Commercial Managed Care - HMO | Admitting: Obstetrics and Gynecology

## 2015-05-12 ENCOUNTER — Ambulatory Visit: Payer: Self-pay | Admitting: Obstetrics and Gynecology

## 2015-05-12 VITALS — BP 122/69 | HR 69 | Ht 65.5 in | Wt 135.2 lb

## 2015-05-12 DIAGNOSIS — N9489 Other specified conditions associated with female genital organs and menstrual cycle: Secondary | ICD-10-CM | POA: Diagnosis not present

## 2015-05-12 DIAGNOSIS — N952 Postmenopausal atrophic vaginitis: Secondary | ICD-10-CM

## 2015-05-12 DIAGNOSIS — N898 Other specified noninflammatory disorders of vagina: Secondary | ICD-10-CM

## 2015-05-14 NOTE — Progress Notes (Signed)
GYNECOLOGY PROGRESS NOTE  Subjective:    Patient ID: Mary Fields, female    DOB: 1941-03-07, 74 y.o.   MRN: [id number]  HPI  Patient is a 74 y.o. [id number] female who presents for complaints of vaginal odor. Notes that it occurs after use of the Premarin cream.  Wonders if she can use a douche.   The following portions of the patient's history were reviewed and updated as appropriate: allergies, current medications, past family history, past medical history, past social history, past surgical history and problem list.  Review of Systems Pertinent items are noted in HPI.   Objective:   Blood pressure 122/69, pulse 69, height 5' 5.5" (1.664 m), weight 135 lb 3.2 oz (61.326 kg). General appearance: alert and no distress Abdomen: soft, non-tender; bowel sounds normal; no masses,  no organomegaly Pelvic: external genitalia normal, rectovaginal septum normal, uterus surgically absent and no vaginal odor, vagina with small amount of discharge Extremities: extremities normal, atraumatic, no cyanosis or edema Neurologic: Grossly normal   Labs:  Microscopic wet-mount exam shows negative for pathogens, normal epithelial cells.   Assessment:   H/o Vaginal atrophy Vaginal odor (perceived by patient, none on exam)  Plan:   1. Discussed use of different pantyliners as some have different absorbencies, odor containers.  2. Patient can replace use of one day of Premarin each week with Rephresh/Replens.  3.  No evidence of vaginitis on exam.  Offered reassurance.  4.  To f/u if symptoms worsen.    Mary Maid, MD [facility]

## 2015-06-08 ENCOUNTER — Encounter: Payer: Self-pay | Admitting: Internal Medicine

## 2015-06-19 ENCOUNTER — Encounter: Payer: Self-pay | Admitting: Internal Medicine

## 2015-06-22 ENCOUNTER — Encounter: Payer: Self-pay | Admitting: Family Medicine

## 2015-06-22 ENCOUNTER — Ambulatory Visit (INDEPENDENT_AMBULATORY_CARE_PROVIDER_SITE_OTHER): Payer: Commercial Managed Care - HMO | Admitting: Family Medicine

## 2015-06-22 VITALS — BP 122/62 | HR 65 | Temp 98.4°F | Ht 65.5 in | Wt 135.1 lb

## 2015-06-22 DIAGNOSIS — H6092 Unspecified otitis externa, left ear: Secondary | ICD-10-CM | POA: Diagnosis not present

## 2015-06-22 MED ORDER — HYDROCORTISONE-ACETIC ACID 1-2 % OT SOLN: 4.0000 [drp] | Freq: Four times a day (QID) | OTIC | Status: DC

## 2015-06-22 NOTE — Patient Instructions (Signed)
It was nice to see you today.  Use the drops if needed.  Follow up closely with Dr. Gilford Rile.  Take care  Dr. Lacinda Axon

## 2015-06-23 DIAGNOSIS — H6090 Unspecified otitis externa, unspecified ear: Secondary | ICD-10-CM | POA: Insufficient documentation

## 2015-06-23 NOTE — Progress Notes (Signed)
   Subjective:  Patient ID: Mary Fields, female    DOB: 11-Mar-1941  Age: 74 y.o. MRN: [id number]  CC: "Water in my left ear"  HPI:  74 year old female presents to clinic today for an acute visit with complaints of "water in my left ear".  Patient reports that this is been troublesome for approximately 1-1/2 weeks. She states that she got water in her ear while showering. She has used some alcohol with little relief. No known exacerbating factors. It has been improving as of yesterday. No associated hearing loss, fevers, chills, drainage.    Social Hx - Former smoker.  Review of Systems  Constitutional: Negative for fever and chills.  HENT: Negative for ear discharge.    Objective:  BP 122/62 mmHg  Pulse 65  Temp(Src) 98.4 F (36.9 C) (Oral)  Ht 5' 5.5" (1.664 m)  Wt 135 lb 2 oz (61.292 kg)  BMI 22.14 kg/m2  SpO2 97%  BP/Weight 06/22/2015 05/12/2015 12/12/2540  Systolic BP 706 237 -  Diastolic BP 62 69 -  Wt. (Lbs) 135.13 135.2 139  BMI 22.14 22.15 22.77   Physical Exam  Constitutional: She is oriented to person, place, and time. She appears well-developed and well-nourished. No distress.  HENT:  Head: Normocephalic and atraumatic.  Mouth/Throat: No oropharyngeal exudate.  Right ear - normal canal, mild cerumen. Normal TM. Left ear - canal mildly erythematous, Normal TM.  Eyes: Left eye exhibits no discharge. No scleral icterus.  Neurological: She is alert and oriented to person, place, and time.  Psychiatric:  Anxious.   Assessment & Plan:   Problem List Items Addressed This Visit    Otitis externa - Primary    Mild. Patient started improving recently. Advised supportive care and use of Vosol-HC if needed.  Rx given today.         Meds ordered this encounter  Medications  . acetic acid-hydrocortisone (VOSOL-HC) otic solution    Sig: Place 4 drops into the left ear 4 (four) times daily.    Dispense:  10 mL    Refill:  0    Follow-up: PRN  Thersa Salt,  DO

## 2015-06-23 NOTE — Assessment & Plan Note (Signed)
Mild. Patient started improving recently. Advised supportive care and use of Vosol-HC if needed.  Rx given today.

## 2015-07-12 ENCOUNTER — Encounter: Payer: Self-pay | Admitting: Family Medicine

## 2015-07-12 ENCOUNTER — Ambulatory Visit (INDEPENDENT_AMBULATORY_CARE_PROVIDER_SITE_OTHER): Payer: Commercial Managed Care - HMO | Admitting: Family Medicine

## 2015-07-12 VITALS — BP 126/80 | HR 75 | Temp 98.6°F | Ht 65.5 in | Wt 133.4 lb

## 2015-07-12 DIAGNOSIS — J069 Acute upper respiratory infection, unspecified: Secondary | ICD-10-CM

## 2015-07-12 NOTE — Progress Notes (Signed)
Pre visit review using our clinic review tool, if applicable. No additional management support is needed unless otherwise documented below in the visit note. 

## 2015-07-12 NOTE — Patient Instructions (Signed)
Continue what your doing at home.  Call if you fail to improve.  Take care  Dr. Lacinda Axon  Upper Respiratory Infection, Adult An upper respiratory infection (URI) is also sometimes known as the common cold. The upper respiratory tract includes the nose, sinuses, throat, trachea, and bronchi. Bronchi are the airways leading to the lungs. Most people improve within 1 week, but symptoms can last up to 2 weeks. A residual cough may last even longer.  CAUSES Many different viruses can infect the tissues lining the upper respiratory tract. The tissues become irritated and inflamed and often become very moist. Mucus production is also common. A cold is contagious. You can easily spread the virus to others by oral contact. This includes kissing, sharing a glass, coughing, or sneezing. Touching your mouth or nose and then touching a surface, which is then touched by another person, can also spread the virus. SYMPTOMS  Symptoms typically develop 1 to 3 days after you come in contact with a cold virus. Symptoms vary from person to person. They may include:  Runny nose.  Sneezing.  Nasal congestion.  Sinus irritation.  Sore throat.  Loss of voice (laryngitis).  Cough.  Fatigue.  Muscle aches.  Loss of appetite.  Headache.  Low-grade fever. DIAGNOSIS  You might diagnose your own cold based on familiar symptoms, since most people get a cold 2 to 3 times a year. Your caregiver can confirm this based on your exam. Most importantly, your caregiver can check that your symptoms are not due to another disease such as strep throat, sinusitis, pneumonia, asthma, or epiglottitis. Blood tests, throat tests, and X-rays are not necessary to diagnose a common cold, but they may sometimes be helpful in excluding other more serious diseases. Your caregiver will decide if any further tests are required. RISKS AND COMPLICATIONS  You may be at risk for a more severe case of the common cold if you smoke  cigarettes, have chronic heart disease (such as heart failure) or lung disease (such as asthma), or if you have a weakened immune system. The very young and very old are also at risk for more serious infections. Bacterial sinusitis, middle ear infections, and bacterial pneumonia can complicate the common cold. The common cold can worsen asthma and chronic obstructive pulmonary disease (COPD). Sometimes, these complications can require emergency medical care and may be life-threatening. PREVENTION  The best way to protect against getting a cold is to practice good hygiene. Avoid oral or hand contact with people with cold symptoms. Wash your hands often if contact occurs. There is no clear evidence that vitamin C, vitamin E, echinacea, or exercise reduces the chance of developing a cold. However, it is always recommended to get plenty of rest and practice good nutrition. TREATMENT  Treatment is directed at relieving symptoms. There is no cure. Antibiotics are not effective, because the infection is caused by a virus, not by bacteria. Treatment may include:  Increased fluid intake. Sports drinks offer valuable electrolytes, sugars, and fluids.  Breathing heated mist or steam (vaporizer or shower).  Eating chicken soup or other clear broths, and maintaining good nutrition.  Getting plenty of rest.  Using gargles or lozenges for comfort.  Controlling fevers with ibuprofen or acetaminophen as directed by your caregiver.  Increasing usage of your inhaler if you have asthma. Zinc gel and zinc lozenges, taken in the first 24 hours of the common cold, can shorten the duration and lessen the severity of symptoms. Pain medicines may help with fever,  muscle aches, and throat pain. A variety of non-prescription medicines are available to treat congestion and runny nose. Your caregiver can make recommendations and may suggest nasal or lung inhalers for other symptoms.  HOME CARE INSTRUCTIONS   Only take  over-the-counter or prescription medicines for pain, discomfort, or fever as directed by your caregiver.  Use a warm mist humidifier or inhale steam from a shower to increase air moisture. This may keep secretions moist and make it easier to breathe.  Drink enough water and fluids to keep your urine clear or pale yellow.  Rest as needed.  Return to work when your temperature has returned to normal or as your caregiver advises. You may need to stay home longer to avoid infecting others. You can also use a face mask and careful hand washing to prevent spread of the virus. SEEK MEDICAL CARE IF:   After the first few days, you feel you are getting worse rather than better.  You need your caregiver's advice about medicines to control symptoms.  You develop chills, worsening shortness of breath, or brown or red sputum. These may be signs of pneumonia.  You develop yellow or brown nasal discharge or pain in the face, especially when you bend forward. These may be signs of sinusitis.  You develop a fever, swollen neck glands, pain with swallowing, or white areas in the back of your throat. These may be signs of strep throat. SEEK IMMEDIATE MEDICAL CARE IF:   You have a fever.  You develop severe or persistent headache, ear pain, sinus pain, or chest pain.  You develop wheezing, a prolonged cough, cough up blood, or have a change in your usual mucus (if you have chronic lung disease).  You develop sore muscles or a stiff neck. Document Released: 04/04/2001 Document Revised: 01/01/2012 Document Reviewed: 01/14/2014 [facility] Patient Information 2015 [place], [place]. This information is not intended to replace advice given to you by your health care Mary Fields. Make sure you discuss any questions you have with your health care Mary Fields.

## 2015-07-12 NOTE — Assessment & Plan Note (Signed)
Symptoms likely viral in origin. Advised continued supportive/conservative care. Follow up if she fails to improve or worsens.

## 2015-07-12 NOTE — Progress Notes (Signed)
   Subjective:  Patient ID: Mary Fields, female    DOB: 01-23-1941  Age: 74 y.o. MRN: [id number]  CC: Cold symptoms  HPI: 75 year old female presents to clinic today for an acute visit with complaints of cold symptoms.  Patient reports that she has had itchy watery eyes, runny nose, sore throat, cough, hoarseness, and sinus pain/pressure for the past 3 days. Associated fevers or chills. She does not know of any recent sick contacts although she does volunteer in the hospital. She's been taking Zicam, Airborne, Rhinocort, and Zyrtec with no improvement in her symptoms. No exacerbating factors.  Social Hx   Social History   Social History  . Marital Status: Widowed    Spouse Name: N/A  . Number of Children: N/A  . Years of Education: N/A   Social History Main Topics  . Smoking status: Former Smoker    Quit date: 08/02/1984  . Smokeless tobacco: None  . Alcohol Use: Yes     Comment: occassionaly  . Drug Use: No  . Sexual Activity: Not Asked   Other Topics Concern  . None   Social History Narrative   Widowed. Lives in [place].      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 (Lbs) 133.38 135.13 135.2  BMI 21.85 22.14 22.15   Physical Exam  Constitutional: She appears well-developed and well-nourished. No distress.  HENT:  Head: Normocephalic.  Normal TMs bilaterally. Oropharynx - mildly erythematous. No exudate. Drainage noted.   Neck: Neck supple.  Cardiovascular: Normal rate and regular rhythm.   Pulmonary/Chest: Effort normal and breath sounds normal. No respiratory distress. She has no wheezes. She has no rales.  Lymphadenopathy:    She has no cervical adenopathy.    Neurological: She is alert.  Psychiatric: She has a normal mood and affect.  Vitals reviewed.  Lab Results  Component Value Date   WBC 6.8 09/07/2014   HGB 14.0 09/07/2014   HCT 42.6 09/07/2014   PLT 240.0 09/07/2014   GLUCOSE 82 09/07/2014   CHOL 175 09/07/2014   TRIG 115.0 09/07/2014   HDL 48.70 09/07/2014   LDLCALC 103* 09/07/2014   ALT 11 09/07/2014   AST 14 09/07/2014   NA 141 09/07/2014   K 4.4 09/07/2014   CL 108 09/07/2014   CREATININE 1.0 09/07/2014   BUN 22 09/07/2014   CO2 23 09/07/2014   TSH 0.56 01/30/2014   MICROALBUR 1.4 09/07/2014   Assessment & Plan:   Problem List Items Addressed This Visit    URI (upper respiratory infection) - Primary    Symptoms likely viral in origin. Advised continued supportive/conservative care. Follow up if she fails to improve or worsens.        Follow-up: PRN  Thersa Salt, DO

## 2015-07-19 ENCOUNTER — Other Ambulatory Visit: Payer: Self-pay

## 2015-07-19 MED ORDER — PREMARIN 0.625 MG/GM VA CREA: TOPICAL_CREAM | VAGINAL | Status: DC

## 2015-07-20 ENCOUNTER — Ambulatory Visit (INDEPENDENT_AMBULATORY_CARE_PROVIDER_SITE_OTHER): Payer: Commercial Managed Care - HMO | Admitting: Family Medicine

## 2015-07-20 ENCOUNTER — Encounter: Payer: Self-pay | Admitting: Family Medicine

## 2015-07-20 VITALS — BP 124/86 | HR 73 | Temp 98.2°F | Ht 65.5 in

## 2015-07-20 DIAGNOSIS — R0602 Shortness of breath: Secondary | ICD-10-CM | POA: Diagnosis not present

## 2015-07-20 DIAGNOSIS — R0609 Other forms of dyspnea: Secondary | ICD-10-CM | POA: Diagnosis not present

## 2015-07-20 DIAGNOSIS — J069 Acute upper respiratory infection, unspecified: Secondary | ICD-10-CM | POA: Diagnosis not present

## 2015-07-20 MED ORDER — ALBUTEROL SULFATE HFA 108 (90 BASE) MCG/ACT IN AERS: 2.0000 | INHALATION_SPRAY | Freq: Four times a day (QID) | RESPIRATORY_TRACT | Status: DC | PRN

## 2015-07-20 NOTE — Patient Instructions (Signed)
Nice to meet you. You likely have a viral bronchitis leading to your symptoms.  Your shortness of breath could be related to this or to a heart issue. We will obtain an echo to evaluate this further.  If you develop chest pain, shortness of breath, fever, productive cough, or feel poorly please seek medical attention. Please let us know if you develop difficulty breathing while laying flat or wake up short of breath at night.

## 2015-07-21 DIAGNOSIS — R0609 Other forms of dyspnea: Secondary | ICD-10-CM

## 2015-07-21 NOTE — Assessment & Plan Note (Addendum)
Reports mild dyspnea on exertion improved with rest over the past year. EKG reassuring. No signs of LVH. Could be CHF vs deconditioning vs asthma related. Vitals stable. No signs of CHF on exam. Will check echo. Given albuterol inhaler as trial. Given return precautions.

## 2015-07-21 NOTE — Progress Notes (Signed)
Patient ID: Mary Fields, female   DOB: 1941-06-24, 74 y.o.   MRN: [id number]  Tommi Rumps, MD Phone: [phone]  Mary Fields is a 74 y.o. female who presents today for same day appointment.   Nasal congestion: patient notes 2 weeks of nasal congestion, post nasal drip, and sore throat. Now with non-productive cough. Eyes and nose has been running. No sinus congestion. Has been improving, though has continued to cough. Notes no CP. Some dyspnea, that has been going on over the past year intermittently and is not worse with this illness. No fevers. No sick contacts. Has history of PNA in the past. Notes history of asthma in the past. Has had minimal wheezing with this.   Dyspnea: notes this is on exertion. Has been intermittent over the past year when she is walking up stairs and walking fast. She has no chest pain with this. Denies chest pain. No PND or orthopnea with this. No wheezing with this. No palpitations with this.   PMH: former smoker.   ROS see HPI  Objective  Physical Exam Filed Vitals:   07/20/15 0928  BP: 124/86  Pulse: 73  Temp: 98.2 F (36.8 C)    Physical Exam  Constitutional: She is well-developed, well-nourished, and in no distress.  HENT:  Head: Normocephalic and atraumatic.  Right Ear: External ear normal.  Left Ear: External ear normal.  Mouth/Throat: Oropharynx is clear and moist. No oropharyngeal exudate.  Normal TMs bilaterally  Eyes: Conjunctivae are normal. Pupils are equal, round, and reactive to light.  Neck: Neck supple.  Cardiovascular: Normal rate, regular rhythm and normal heart sounds.  Exam reveals no gallop and no friction rub.   No murmur heard. Pulmonary/Chest: Effort normal and breath sounds normal. No respiratory distress. She has no wheezes. She has no rales.  Lymphadenopathy:    She has no cervical adenopathy.  Neurological: She is alert. Gait normal.  Skin: Skin is warm and dry. She is not diaphoretic.   EKG: NSR, rate  64, no LVH  Assessment/Plan: Please see individual problem list.  URI (upper respiratory infection) Symptoms have improved some, though continues to have cough. Possibly could be due to bronchitis. Likely viral in nature. Could also be post viral cough. Normal lung exam. Normal O2 sat. Unlikely PNA given normal lung exam. Doubt chronic intermittent dyspnea is related to this acute issue. Offered albuterol neb in office to see if this would be of benefit, though patient declined. Will send in albuterol inhaler. No need for antibiotics given likely viral nature of illness. Given return precautions.   Dyspnea on exertion Reports mild dyspnea on exertion improved with rest over the past year. EKG reassuring. No signs of LVH. Could be CHF vs deconditioning vs asthma related. Vitals stable. No signs of CHF on exam. Will check echo. Given albuterol inhaler as trial. Given return precautions.     Orders Placed This Encounter  Procedures  . EKG 12-Lead  . Echocardiogram    Standing Status: Future     Number of Occurrences:      Standing Expiration Date: 10/18/2016    Order Specific Question:  Where should this test be performed    Answer:  [facility]    Order Specific Question:  Complete or Limited study?    Answer:  Complete    Order Specific Question:  With Image Enhancing Agent or without Image Enhancing Agent?    Answer:  With Image Enhancing Agent    Order Specific Question:  Reason for  exam-Echo    Answer:  Dyspnea  786.09 / R06.00    Meds ordered this encounter  Medications  . albuterol (PROVENTIL HFA;VENTOLIN HFA) 108 (90 BASE) MCG/ACT inhaler    Sig: Inhale 2 puffs into the lungs every 6 (six) hours as needed for wheezing or shortness of breath.    Dispense:  1 Inhaler    Refill:  0    Tommi Rumps

## 2015-07-21 NOTE — Assessment & Plan Note (Signed)
Symptoms have improved some, though continues to have cough. Possibly could be due to bronchitis. Likely viral in nature. Could also be post viral cough. Normal lung exam. Normal O2 sat. Unlikely PNA given normal lung exam. Doubt chronic intermittent dyspnea is related to this acute issue. Offered albuterol neb in office to see if this would be of benefit, though patient declined. Will send in albuterol inhaler. No need for antibiotics given likely viral nature of illness. Given return precautions.

## 2015-08-19 ENCOUNTER — Other Ambulatory Visit: Payer: Self-pay

## 2015-08-19 ENCOUNTER — Ambulatory Visit (INDEPENDENT_AMBULATORY_CARE_PROVIDER_SITE_OTHER): Payer: Commercial Managed Care - HMO

## 2015-08-19 DIAGNOSIS — R0602 Shortness of breath: Secondary | ICD-10-CM | POA: Diagnosis not present

## 2015-08-25 ENCOUNTER — Other Ambulatory Visit: Payer: Self-pay | Admitting: Family Medicine

## 2015-08-25 DIAGNOSIS — R0609 Other forms of dyspnea: Principal | ICD-10-CM

## 2015-09-01 ENCOUNTER — Ambulatory Visit (INDEPENDENT_AMBULATORY_CARE_PROVIDER_SITE_OTHER): Payer: Commercial Managed Care - HMO | Admitting: Nurse Practitioner

## 2015-09-01 ENCOUNTER — Encounter: Payer: Self-pay | Admitting: Nurse Practitioner

## 2015-09-01 ENCOUNTER — Other Ambulatory Visit
Admission: RE | Admit: 2015-09-01 | Discharge: 2015-09-01 | Disposition: A | Discharge status: Home / Self Care | Payer: Commercial Managed Care - HMO | Source: Ambulatory Visit | Attending: Nurse Practitioner | Admitting: Nurse Practitioner

## 2015-09-01 VITALS — BP 132/60 | HR 71 | Ht 67.0 in | Wt 134.0 lb

## 2015-09-01 DIAGNOSIS — Z0289 Encounter for other administrative examinations: Secondary | ICD-10-CM | POA: Diagnosis present

## 2015-09-01 DIAGNOSIS — R0609 Other forms of dyspnea: Secondary | ICD-10-CM | POA: Diagnosis not present

## 2015-09-01 LAB — FIBRIN DERIVATIVES D-DIMER (ARMC ONLY): Fibrin derivatives D-dimer (ARMC): 500 — ABNORMAL HIGH (ref 0–499)

## 2015-09-01 NOTE — Patient Instructions (Addendum)
Medication Instructions:  Your physician recommends that you continue on your current medications as directed. Please refer to the Current Medication list given to you today.   Labwork: [place]  Testing/Procedures: A chest x-ray takes a picture of the organs and structures inside the chest, including the heart, lungs, and blood vessels. This test can show several things, including, whether the heart is enlarges; whether fluid is building up in the lungs; and whether pacemaker / defibrillator leads are still in place.  Your physician has requested that you have a lexiscan myoview. For further information please visit [email or web address]. Please follow instruction sheet, as given.  [place]  Your caregiver has ordered a Stress Test with nuclear imaging. The purpose of this test is to evaluate the blood supply to your heart muscle. This procedure is referred to as a "Non-Invasive Stress Test." This is because other than having an IV started in your vein, nothing is inserted or "invades" your body. Cardiac stress tests are done to find areas of poor blood flow to the heart by determining the extent of coronary artery disease (CAD). Some patients exercise on a treadmill, which naturally increases the blood flow to your heart, while others who are  unable to walk on a treadmill due to physical limitations have a pharmacologic/chemical stress agent called Lexiscan . This medicine will mimic walking on a treadmill by temporarily increasing your coronary blood flow.   Please note: these test may take anywhere between 2-4 hours to complete  PLEASE REPORT TO [facility] AT THE FIRST DESK WILL DIRECT YOU WHERE TO GO  Date of Procedure: Friday, November 18, 7:30am Arrival Time for Procedure: 7:15am Instructions regarding medication:  You may take your medications with a sip of water.  _ PLEASE NOTIFY THE OFFICE AT LEAST 24 HOURS IN ADVANCE IF YOU ARE UNABLE TO [place].  [phone] AND  PLEASE NOTIFY NUCLEAR MEDICINE AT [facility] AT LEAST 24 HOURS IN ADVANCE IF YOU ARE UNABLE TO KEEP YOUR APPOINTMENT. [phone]  How to prepare for your Myoview test:   Do not eat or drink after midnight  No caffeine for 24 hours prior to test  No smoking 24 hours prior to test.  Your medication may be taken with water.  If your doctor stopped a medication because of this test, do not take that medication.  Ladies, please do not wear dresses.  Skirts or pants are appropriate. Please wear a short sleeve shirt.  No perfume, cologne or lotion.  Wear comfortable walking shoes. No heels!            Follow-Up: Your physician recommends that you schedule a follow-up appointment in: three months with Dr. Rockey Situ   Any Other Special Instructions Will Be Listed Below (If Applicable).     If you need a refill on your cardiac medications before your next appointment, please call your pharmacy.  Cardiac Nuclear Scanning A cardiac nuclear scan is used to check your heart for problems, such as the following:  A portion of the heart is not getting enough blood.  Part of the heart muscle has died, which happens with a heart attack.  The heart wall is not working normally.  In this test, a radioactive dye (tracer) is injected into your bloodstream. After the tracer has traveled to your heart, a scanning device is used to measure how much of the tracer is absorbed by or distributed to various areas of your heart. [place]  PROVIDER KNOW ABOUT:  Any allergies you have.  All medicines you are taking, including vitamins, herbs, eye drops, creams, and over-the-counter medicines.  Previous problems you or members of your family have had with the use of anesthetics.  Any blood disorders you have.  Previous surgeries you have had.  Medical conditions you have.  RISKS AND COMPLICATIONS Generally, this is a safe procedure. However, as  with any procedure, problems can occur. Possible problems include:   Serious chest pain.  Rapid heartbeat.  Sensation of warmth in your chest. This usually passes quickly. BEFORE THE PROCEDURE Ask your health care provider about changing or stopping your regular medicines. PROCEDURE This procedure is usually done at a hospital and takes 2-4 hours.  An IV tube is inserted into one of your veins.  Your health care provider will inject a small amount of radioactive tracer through the tube.  You will then wait for 20-40 minutes while the tracer travels through your bloodstream.  You will lie down on an exam table so images of your heart can be taken. Images will be taken for about 15-20 minutes.  You will exercise on a treadmill or stationary bike. While you exercise, your heart activity will be monitored with an electrocardiogram (ECG), and your blood pressure will be checked.  If you are unable to exercise, you may be given a medicine to make your heart beat faster.  When blood flow to your heart has peaked, tracer will again be injected through the IV tube.  After 20-40 minutes, you will get back on the exam table and have more images taken of your heart.  When the procedure is over, your IV tube will be removed. AFTER THE PROCEDURE  You will likely be able to leave shortly after the test. Unless your health care provider tells you otherwise, you may return to your normal schedule, including diet, activities, and medicines.  Make sure you find out how and when you will get your test results.   This information is not intended to replace advice given to you by your health care provider. Make sure you discuss any questions you have with your health care provider.   Document Released: 11/03/2004 Document Revised: 10/14/2013 Document Reviewed: 09/17/2013 Elsevier Interactive Patient Education [employer].

## 2015-09-01 NOTE — Progress Notes (Signed)
Patient Name: Mary Fields Date of Encounter: 09/01/2015  Primary Care Provider:  Rica Mast, MD Primary Cardiologist:  Johnny Bridge, MD   Chief Complaint  74 year old female with a prior history of palpitations and remote tobacco abuse who presents with a four-month history of progressive dyspnea.  Past Medical History   Past Medical History  Diagnosis Date  . Other malaise and fatigue   . Benign neoplasm of skin, site unspecified     Dr. Aubery Lapping  . 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 Dizziness and giddiness   . Palpitations   . Herpes simplex without mention of complication   . Ulcerative colitis, unspecified   . Malignant neoplasm of kidney, except pelvis   . Disorder of bone and cartilage, unspecified   . Esophageal reflux   . Personal history of other malignant neoplasm of skin   . Personal history of malignant neoplasm of breast   . Hx-TIA (transient ischemic attack)   . Atypical nevi   . Diverticulosis   . Erosive esophagitis   . Colitis   . Unspecified asthma(493.90)     since childhood  . Hip fracture ([place]) 11/12    surgery  . Vaginal atrophy   . Vaginal stenosis   . Palpitations     a. 07/2015 Echo: EF 60-65%, Gr 1 DD, mild AI.  Marland Kitchen Dyspnea on exertion   . Tobacco abuse    Past Surgical History  Procedure Laterality Date  . Melanoma excision  1980    Dr. Sharlet Salina  . Nephrectomy  2000    left renal cell carcinoma, Dr. Jacqlyn Larsen  . Hip fracture surgery  2005    hemiarthroplasty right, Dr. Francia Greaves  . Esophagogastroduodenoscopy  2002    esophagitis; grade 1, erosive  . Colonoscopy  4/10    diverticulosis; recheck 5 years  . Appendectomy    . Abdominal hysterectomy  1986    endometriosis, Dr. Pearletha Furl  . Breast lumpectomy  1995    right breast cancer, lumpectomy and XRT and chemo  . Hip fracture surgery  11/12    [facility] fall at [employer]    Allergies  Allergies  Allergen Reactions  . Ciprofloxacin    REACTION: diarrhea  . Meperidine     Other reaction(s): Other (See Comments)  . Prednisone Hives  . Sulfonamide Derivatives     REACTION: whelps    HPI  74 year old female with the above complex problem list. She was last seen by Dr. Rockey Situ in May 2015 secondary to palpitations. No further workup was felt to be necessary at that time. She is a reasonably active lady and over the past 4-1/2 months, she's been noticing increasing dyspnea on exertion. She says that this can often be associated with wheezing but not always. She saw her primary care provider in September and had an echocardiogram performed which showed normal LV function with grade 1 diastolic dysfunction. In mid-October, she traveled to [place] and had to limit her pace in order to avoid dyspnea. Since her return from [place], she feels as though her dyspnea has worsened. Notably, she did wear thromboembolic stockings while on the plane going both ways however did develop bilateral lower extremity edema following her return.  This has since resolved. She has never had exertional chest discomfort but has occasionally noted chest tightness while lying in bed that is improved with deep breaths. Over the past several years, she is also noted progressive hoarseness of her voice. She reports  a history of GERD and does take Zantac for that. She also states with her head of bed elevated. Hoarseness is not appear to have been previously evaluated. She denies PND, orthopnea, dizziness, syncope, or early satiety.  Home Medications  Prior to Admission medications   Medication Sig Start Date End Date Taking? Authorizing Provider  acyclovir (ZOVIRAX) 400 MG tablet Take 1 tablet (400 mg total) by mouth 4 (four) times daily. 09/07/14  Yes Jackolyn Confer, MD  aspirin 81 MG tablet Take 81 mg by mouth every other day.    Yes Historical Provider, MD  Multiple Vitamin (MULTI-VITAMINS) TABS Take by mouth.   Yes Historical Provider, MD  Multiple Vitamin  (MULTIVITAMIN) tablet Take 1 tablet by mouth daily.     Yes Historical Provider, MD  NON FORMULARY Ease Takes 1 tablet by mouth daily.   Yes Historical Provider, MD  PREMARIN vaginal cream Place vaginally 2 (two) times a week. APPLY 1/2 GRAM VAGINALLY TWICE A WEEK Patient taking differently: Place 1 Applicatorful vaginally once a week.  07/19/15  Yes Alanda Slim Defrancesco, MD  Propylene Glycol (SYSTANE BALANCE OP) 1 drop daily.    Yes Historical Provider, MD  ranitidine (ZANTAC) 300 MG tablet Take 1 tablet (300 mg total) by mouth at bedtime. 02/23/15  Yes Jackolyn Confer, MD    Review of Systems  As above, she reports a 4-1/2 month history of progressive dyspnea on exertion.  She denies PND, orthopnea, dizziness, syncope, or early satiety. All other systems reviewed and are otherwise negative except as noted above.  Physical Exam  VS:  Ht 5\' 7"  (1.702 m)  Wt 134 lb (60.782 kg)  BMI 20.98 kg/m2 , BMI Body mass index is 20.98 kg/(m^2). GEN: Well nourished, well developed, in no acute distress. HEENT: normal. Neck: Supple, no JVD, carotid bruits, or masses. Cardiac: RRR, no murmurs, rubs, or gallops. No clubbing, cyanosis, edema.  Radials/DP/PT 2+ and equal bilaterally.  Respiratory:  Respirations regular and unlabored, clear to auscultation bilaterally. GI: Soft, nontender, nondistended, BS + x 4. MS: no deformity or atrophy. Skin: warm and dry, no rash. Neuro:  Strength and sensation are intact. Psych: Normal affect.  Accessory Clinical Findings  ECG - ECG from 07/20/2015 reviewed. Regular sinus rhythm, 64, no acute ST or T changes.   Assessment & Plan  1.  Dyspnea on exertion: Patient reports a 4-1/2 month history of progressive dyspnea on exertion that has been worse since taking an international flight to and from [place]. She had an echocardiogram in September that showed normal LV function and grade 1 diastolic dysfunction. She has never experienced chest pain with exertion that  has had intermittent chest discomfort, generally occurring at night, which is resolved with deep breathing. She reports occasional wheezing associated with her dyspnea and does have a smoking history, though she quit in 1985. She also has a history of childhood asthma but has not required inhalers as an adult. I have ordered a d-dimer given recent prolonged travel though it is notable that her symptoms preceded that, symptoms have been worse since. She is not tachycardic and is otherwise hemodynamically stable today. I have also ordered a chest x-ray however if d-dimer returned elevated, we will switch this to a CT angiogram of the chest to rule out PE. Lastly, I have ordered a Lexiscan Cardiolite to rule out ischemia as dyspnea on exertion may potentially be her anginal equivalent. If all testing returns within normal limits, I would recommend referral to pulmonology for  pulmonary function testing and additional evaluation.  2. Disposition: We will schedule her for follow-up in 3 months just to have something in place, however if any of her testing returns abnormal, we'll contact her for follow-up sooner.  Murray Hodgkins, NP 09/01/2015, 3:41 PM

## 2015-09-02 ENCOUNTER — Other Ambulatory Visit
Admission: RE | Admit: 2015-09-02 | Discharge: 2015-09-02 | Disposition: A | Discharge status: Home / Self Care | Payer: Commercial Managed Care - HMO | Source: Ambulatory Visit | Attending: Nurse Practitioner | Admitting: Nurse Practitioner

## 2015-09-02 ENCOUNTER — Telehealth: Payer: Self-pay

## 2015-09-02 ENCOUNTER — Other Ambulatory Visit: Payer: Self-pay

## 2015-09-02 ENCOUNTER — Ambulatory Visit
Admission: RE | Admit: 2015-09-02 | Discharge: 2015-09-02 | Disposition: A | Discharge status: Home / Self Care | Payer: Commercial Managed Care - HMO | Source: Ambulatory Visit | Attending: Nurse Practitioner | Admitting: Nurse Practitioner

## 2015-09-02 ENCOUNTER — Ambulatory Visit: Payer: Commercial Managed Care - HMO | Admitting: Family Medicine

## 2015-09-02 DIAGNOSIS — R791 Abnormal coagulation profile: Secondary | ICD-10-CM | POA: Insufficient documentation

## 2015-09-02 DIAGNOSIS — D689 Coagulation defect, unspecified: Secondary | ICD-10-CM | POA: Insufficient documentation

## 2015-09-02 DIAGNOSIS — R7989 Other specified abnormal findings of blood chemistry: Secondary | ICD-10-CM

## 2015-09-02 DIAGNOSIS — Z01812 Encounter for preprocedural laboratory examination: Secondary | ICD-10-CM | POA: Diagnosis present

## 2015-09-02 LAB — BASIC METABOLIC PANEL
Anion gap: 5 (ref 5–15)
BUN: 24 mg/dL — AB (ref 6–20)
CALCIUM: 9.2 mg/dL (ref 8.9–10.3)
CO2: 25 mmol/L (ref 22–32)
CREATININE: 0.83 mg/dL (ref 0.44–1.00)
Chloride: 107 mmol/L (ref 101–111)
GFR calc non Af Amer: >60 mL/min (ref >60)
Glucose, Bld: 86 mg/dL (ref 65–99)
Potassium: 4.3 mmol/L (ref 3.5–5.1)
SODIUM: 137 mmol/L (ref 135–145)

## 2015-09-02 LAB — CBC
HEMATOCRIT: 41.1 % (ref 35.0–47.0)
Hemoglobin: 13.8 g/dL (ref 12.0–16.0)
MCH: 31.1 pg (ref 26.0–34.0)
MCHC: 33.5 g/dL (ref 32.0–36.0)
MCV: 93 fL (ref 80.0–100.0)
Platelets: 208 10*3/uL (ref 150–440)
RBC: 4.42 MIL/uL (ref 3.80–5.20)
RDW: 16.7 % — AB (ref 11.5–14.5)
WBC: 7.7 10*3/uL (ref 3.6–11.0)

## 2015-09-02 MED ORDER — IOHEXOL 350 MG/ML SOLN
75.0000 mL | Freq: Once | INTRAVENOUS | Status: AC | PRN
Start: 2015-09-02 — End: 2015-09-02
  Administered 2015-09-02: 75 mL via INTRAVENOUS

## 2015-09-02 NOTE — Telephone Encounter (Signed)
Returned phone call from [place] at [facility] Radiology. Left message for call back

## 2015-09-02 NOTE — Telephone Encounter (Signed)
CT angio to r/o PE results reviewed by Ignacia Bayley, NP S/w pt of results. Pt verbalized understanding and asks if she still needs lexi myoview. Explained lexi and CT angio and need for lexi to attempt to determine cause of SOB. Confirmed date and time of test. Pt agreeable w/plan and verbalized understanding.

## 2015-09-08 ENCOUNTER — Other Ambulatory Visit: Payer: Commercial Managed Care - HMO

## 2015-09-09 ENCOUNTER — Telehealth: Payer: Self-pay

## 2015-09-09 NOTE — Telephone Encounter (Signed)
Reviewed lexi instructions w/pt. Pt states she doesn't know what the test is for. Reviewed procedure and reason for this test. Pt verbalized understanding. States Sabrina left her a message regarding copay. Transferred pt to front desk to discuss.

## 2015-09-10 ENCOUNTER — Ambulatory Visit
Admission: RE | Admit: 2015-09-10 | Discharge: 2015-09-10 | Disposition: A | Discharge status: Home / Self Care | Payer: Commercial Managed Care - HMO | Source: Ambulatory Visit | Attending: Nurse Practitioner | Admitting: Nurse Practitioner

## 2015-09-10 ENCOUNTER — Encounter (HOSPITAL_BASED_OUTPATIENT_CLINIC_OR_DEPARTMENT_OTHER)
Admission: RE | Admit: 2015-09-10 | Discharge: 2015-09-10 | Disposition: A | Discharge status: Home / Self Care | Payer: Commercial Managed Care - HMO | Source: Ambulatory Visit | Attending: Nurse Practitioner | Admitting: Nurse Practitioner

## 2015-09-10 DIAGNOSIS — R0609 Other forms of dyspnea: Secondary | ICD-10-CM | POA: Diagnosis not present

## 2015-09-10 LAB — NM MYOCAR MULTI W/SPECT W/WALL MOTION / EF
CHL CUP NUCLEAR SDS: 0
CHL CUP NUCLEAR SRS: 4
CHL CUP NUCLEAR SSS: 1
CHL CUP STRESS STAGE 2 GRADE: 0 %
CHL CUP STRESS STAGE 2 HR: 77 {beats}/min
CHL CUP STRESS STAGE 3 GRADE: 0 %
CHL CUP STRESS STAGE 3 HR: 77 {beats}/min
CHL CUP STRESS STAGE 3 SPEED: 0 mph
CHL CUP STRESS STAGE 4 GRADE: 0 %
CHL CUP STRESS STAGE 5 GRADE: 0 %
CHL CUP STRESS STAGE 5 HR: 110 {beats}/min
CHL CUP STRESS STAGE 6 DBP: 75 mmHg
CHL CUP STRESS STAGE 6 HR: 98 {beats}/min
CHL CUP STRESS STAGE 6 SBP: 151 mmHg
CHL CUP STRESS STAGE 6 SPEED: 0 mph
CSEPEW: 1 METS
LV sys vol: 26 mL
LVDIAVOL: 74 mL
Peak HR: 100 {beats}/min
Percent HR: 78 %
Percent of predicted max HR: 69 %
Rest HR: 80 {beats}/min
Stage 1 HR: 78 {beats}/min
Stage 2 Speed: 0 mph
Stage 4 HR: 100 {beats}/min
Stage 4 Speed: 0 mph
Stage 5 Speed: 0 mph
Stage 6 Grade: 0 %
TID: 0.84

## 2015-09-10 MED ORDER — TECHNETIUM TC 99M SESTAMIBI - CARDIOLITE
10.0000 | Freq: Once | INTRAVENOUS | Status: AC | PRN
  Administered 2015-09-10: 14.02 via INTRAVENOUS

## 2015-09-10 MED ORDER — REGADENOSON 0.4 MG/5ML IV SOLN
0.4000 mg | Freq: Once | INTRAVENOUS | Status: AC
  Administered 2015-09-10: 0.4 mg via INTRAVENOUS

## 2015-09-10 MED ORDER — TECHNETIUM TC 99M SESTAMIBI - CARDIOLITE
31.5400 | Freq: Once | INTRAVENOUS | Status: AC | PRN
  Administered 2015-09-10: 31.54 via INTRAVENOUS

## 2015-09-13 ENCOUNTER — Telehealth: Payer: Self-pay | Admitting: *Deleted

## 2015-09-13 ENCOUNTER — Other Ambulatory Visit: Payer: Self-pay

## 2015-09-13 DIAGNOSIS — R0602 Shortness of breath: Secondary | ICD-10-CM

## 2015-09-13 NOTE — Telephone Encounter (Signed)
lmov to schedule an apt with Pulmonary

## 2015-09-22 ENCOUNTER — Ambulatory Visit (INDEPENDENT_AMBULATORY_CARE_PROVIDER_SITE_OTHER): Payer: Commercial Managed Care - HMO | Admitting: Internal Medicine

## 2015-09-22 ENCOUNTER — Encounter: Payer: Self-pay | Admitting: Internal Medicine

## 2015-09-22 VITALS — BP 128/70 | HR 68 | Temp 98.1°F | Ht 65.5 in | Wt 134.5 lb

## 2015-09-22 DIAGNOSIS — R490 Dysphonia: Secondary | ICD-10-CM | POA: Diagnosis not present

## 2015-09-22 DIAGNOSIS — Z1211 Encounter for screening for malignant neoplasm of colon: Secondary | ICD-10-CM | POA: Diagnosis not present

## 2015-09-22 DIAGNOSIS — R0609 Other forms of dyspnea: Secondary | ICD-10-CM | POA: Diagnosis not present

## 2015-09-22 NOTE — Progress Notes (Signed)
Pre visit review using our clinic review tool, if applicable. No additional management support is needed unless otherwise documented below in the visit note. 

## 2015-09-22 NOTE — Assessment & Plan Note (Signed)
Reviewed recent evaluation for dyspnea on exertion. CXR, CT chest normal. Stress test normal. Suspect deconditioning and mild COPD may be contributing given her smoking history. Pulmonary evaluation pending. Exam today normal. Follow up after pulmonary evaluation complete.

## 2015-09-22 NOTE — Assessment & Plan Note (Signed)
2 year history of hoarseness. Will set up ENT evaluation for direct visualization of vocal cords.

## 2015-09-22 NOTE — Assessment & Plan Note (Signed)
Will set up Cologuard testing.

## 2015-09-22 NOTE — Patient Instructions (Signed)
We will set up an evaluation with Dr. Richardson Landry in ENT.  Follow up after pulmonary evaluation.

## 2015-09-22 NOTE — Progress Notes (Signed)
Subjective:    Patient ID: Mary Fields, female    DOB: 1941/04/12, 74 y.o.   MRN: [id number]  HPI  74YO female presents for acute visit.  Hoarseness - Noted hoarseness over the last 2 years. Attributed to GERD. Never evaluated by ENT. Occasional trouble swallowing solid foods, feels that food get stuck. No NV. No sore throat.  Dyspnea - Recently evaluated for dyspnea on exertion. Symptoms noted over the last 2 months. Former smoker. History of asthma as a child. Stress test was normal. CXR and CT showed no acute process. Evaluation with pulmonary scheduled for 12/8.  Would like to order Cologuard for colon cancer screening.    Wt Readings from Last 3 Encounters:  09/22/15 134 lb 8 oz (61.009 kg)  09/01/15 134 lb (60.782 kg)  07/12/15 133 lb 6 oz (60.499 kg)   BP Readings from Last 3 Encounters:  09/22/15 128/70  09/01/15 132/60  07/20/15 124/86    Past Medical History  Diagnosis Date  . Other malaise and fatigue   . Benign neoplasm of skin, site unspecified     Dr. Aubery Lapping  . 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 Dizziness and giddiness   . Palpitations   . Herpes simplex without mention of complication   . Ulcerative colitis, unspecified   . Malignant neoplasm of kidney, except pelvis   . Disorder of bone and cartilage, unspecified   . Esophageal reflux   . Personal history of other malignant neoplasm of skin   . Personal history of malignant neoplasm of breast   . Hx-TIA (transient ischemic attack)   . Atypical nevi   . Diverticulosis   . Erosive esophagitis   . Colitis   . Unspecified asthma(493.90)     since childhood  . Hip fracture ([place]) 11/12    surgery  . Vaginal atrophy   . Vaginal stenosis   . Palpitations     a. 07/2015 Echo: EF 60-65%, Gr 1 DD, mild AI.  Marland Kitchen Dyspnea on exertion   . Tobacco abuse    Family History  Problem Relation Age of Onset  . Heart disease Paternal Grandmother   . Hypertension  Paternal Grandmother   . Stomach cancer Maternal Grandmother   . Kidney disease Maternal Uncle   . Cancer Paternal Aunt     breast cancer   Past Surgical History  Procedure Laterality Date  . Melanoma excision  1980    Dr. Sharlet Salina  . Nephrectomy  2000    left renal cell carcinoma, Dr. Jacqlyn Larsen  . Hip fracture surgery  2005    hemiarthroplasty right, Dr. Francia Greaves  . Esophagogastroduodenoscopy  2002    esophagitis; grade 1, erosive  . Colonoscopy  4/10    diverticulosis; recheck 5 years  . Appendectomy    . Abdominal hysterectomy  1986    endometriosis, Dr. Pearletha Furl  . Breast lumpectomy  1995    right breast cancer, lumpectomy and XRT and chemo  . Hip fracture surgery  11/12    after fall at [employer] History  . Marital Status: Widowed    Spouse Name: N/A  . Number of Children: N/A  . Years of Education: N/A   Social History Main Topics  . Smoking status: Former Smoker    Quit date: 08/02/1984  . Smokeless tobacco: None  . Alcohol Use: Yes     Comment: occassionaly  . Drug Use: No  . Sexual Activity:  Not Asked   Other Topics Concern  . None   Social History Narrative   Widowed. Lives in [place].      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  HENT: Positive for trouble swallowing and voice change. Negative for congestion, postnasal drip, rhinorrhea and sore throat.   Eyes: Negative for visual disturbance.  Respiratory: Positive for shortness of breath. Negative for cough, chest tightness and wheezing.   Cardiovascular: Negative for chest pain and leg swelling.  Gastrointestinal: Negative for nausea, vomiting, abdominal pain, diarrhea and constipation.  Musculoskeletal: Negative for myalgias and arthralgias.  Skin: Negative for color change and rash.  Hematological: Negative for adenopathy. Does not bruise/bleed  easily.  Psychiatric/Behavioral: Negative for dysphoric mood. The patient is not nervous/anxious.        Objective:    BP 128/70 mmHg  Pulse 68  Temp(Src) 98.1 F (36.7 C) (Oral)  Ht 5' 5.5" (1.664 m)  Wt 134 lb 8 oz (61.009 kg)  BMI 22.03 kg/m2  SpO2 97% Physical Exam  Constitutional: She is oriented to person, place, and time. She appears well-developed and well-nourished. No distress.  HENT:  Head: Normocephalic and atraumatic.  Right Ear: External ear normal.  Left Ear: External ear normal.  Nose: Nose normal.  Mouth/Throat: Oropharynx is clear and moist. No oropharyngeal exudate.  Eyes: Conjunctivae are normal. Pupils are equal, round, and reactive to light. Right eye exhibits no discharge. Left eye exhibits no discharge. No scleral icterus.  Neck: Normal range of motion. Neck supple. No tracheal deviation present. No thyromegaly present.  Cardiovascular: Normal rate, regular rhythm, normal heart sounds and intact distal pulses.  Exam reveals no gallop and no friction rub.   No murmur heard. Pulmonary/Chest: Effort normal and breath sounds normal. No respiratory distress. She has no wheezes. She has no rales. She exhibits no tenderness.  Musculoskeletal: Normal range of motion. She exhibits no edema or tenderness.  Lymphadenopathy:    She has no cervical adenopathy.  Neurological: She is alert and oriented to person, place, and time. No cranial nerve deficit. She exhibits normal muscle tone. Coordination normal.  Skin: Skin is warm and dry. No rash noted. She is not diaphoretic. No erythema. No pallor.  Psychiatric: She has a normal mood and affect. Her behavior is normal. Judgment and thought content normal.          Assessment & Plan:   Problem List Items Addressed This Visit      Unprioritized   Dyspnea on exertion    Reviewed recent evaluation for dyspnea on exertion. CXR, CT chest normal. Stress test normal. Suspect deconditioning and mild COPD may be  contributing given her smoking history. Pulmonary evaluation pending. Exam today normal. Follow up after pulmonary evaluation complete.      Hoarseness - Primary    2 year history of hoarseness. Will set up ENT evaluation for direct visualization of vocal cords.       Relevant Orders   Ambulatory referral to ENT   Special screening for malignant neoplasms, colon    Will set up Cologuard testing.          Return in about 4 weeks (around 10/20/2015) for Recheck.

## 2015-09-30 ENCOUNTER — Ambulatory Visit (INDEPENDENT_AMBULATORY_CARE_PROVIDER_SITE_OTHER): Payer: Commercial Managed Care - HMO | Admitting: Pulmonary Disease

## 2015-09-30 ENCOUNTER — Encounter: Payer: Self-pay | Admitting: Pulmonary Disease

## 2015-09-30 VITALS — BP 126/60 | HR 69 | Ht 66.0 in | Wt 134.2 lb

## 2015-09-30 DIAGNOSIS — R0600 Dyspnea, unspecified: Secondary | ICD-10-CM | POA: Diagnosis not present

## 2015-09-30 DIAGNOSIS — Z87891 Personal history of nicotine dependence: Secondary | ICD-10-CM | POA: Diagnosis not present

## 2015-09-30 MED ORDER — FLUTICASONE FUROATE-VILANTEROL 100-25 MCG/INH IN AEPB: 1.0000 | INHALATION_SPRAY | Freq: Every day | RESPIRATORY_TRACT | Status: DC

## 2015-09-30 MED ORDER — FLUTICASONE FUROATE-VILANTEROL 100-25 MCG/INH IN AEPB: 1.0000 | INHALATION_SPRAY | Freq: Every day | RESPIRATORY_TRACT | Status: AC

## 2015-09-30 NOTE — Progress Notes (Signed)
PULMONARY CONSULT NOTE  Requesting MD/Service: Sharolyn Douglas, NP/Cardiology  Date of initial consultation: 09/30/15 Reason for consultation: Exertional dyspnea  HPI:  29 F who has been evaluated by Cardiology for DOE X 3-4 months duration. She also reports chronic NP cough which is mild and present during both day and night and worse in the spring season. She has a prior history of childhood asthma which she "grew out of". Thus far, she has undergone echocardiogram which was normal, Myoview, also normal and CT angio of chest which revealed no PE. She was a smoker until her mid 39s. Her dyspnea is consistent with little day to day variation. She remains active, walking for exercise regularly. Her exercise tolerance is probably above average for a woman of her age and she is able to walk long distance as long as the pace is not too fast. She denies CP, orthopnea, PND, sinus symptoms, neck pain, LAN, F/C/S, unexplained weight loss, N/V/D, dysuria, LE edema, calf tenderness  Past Medical History  Diagnosis Date  . Other malaise and fatigue   . Benign neoplasm of skin, site unspecified     Dr. Aubery Lapping  . 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 Dizziness and giddiness   . Palpitations   . Herpes simplex without mention of complication   . Ulcerative colitis, unspecified   . Malignant neoplasm of kidney, except pelvis   . Disorder of bone and cartilage, unspecified   . Esophageal reflux   . Personal history of other malignant neoplasm of skin   . Personal history of malignant neoplasm of breast   . Hx-TIA (transient ischemic attack)   . Atypical nevi   . Diverticulosis   . Erosive esophagitis   . Colitis   . Unspecified asthma(493.90)     since childhood  . Hip fracture ([place]) 11/12    surgery  . Vaginal atrophy   . Vaginal stenosis   . Palpitations     a. 07/2015 Echo: EF 60-65%, Gr 1 DD, mild AI.  Marland Kitchen Dyspnea on exertion   . Tobacco abuse    Past Surgical  History  Procedure Laterality Date  . Melanoma excision  1980    Dr. Sharlet Salina  . Nephrectomy  2000    left renal cell carcinoma, Dr. Jacqlyn Larsen  . Hip fracture surgery  2005    hemiarthroplasty right, Dr. Francia Greaves  . Esophagogastroduodenoscopy  2002    esophagitis; grade 1, erosive  . Colonoscopy  4/10    diverticulosis; recheck 5 years  . Appendectomy    . Abdominal hysterectomy  1986    endometriosis, Dr. Pearletha Furl  . Breast lumpectomy  1995    right breast cancer, lumpectomy and XRT and chemo  . Hip fracture surgery  11/12    after fall at church    MEDICATIONS: reviewed  Social History   Social History  . Marital Status: Widowed    Spouse Name: N/A  . Number of Children: N/A  . Years of Education: N/A   Occupational History  . Not on file.   Social History Main Topics  . Smoking status: Former Smoker    Quit date: 08/02/1984  . Smokeless tobacco: Not on file  . Alcohol Use: Yes     Comment: occassionaly  . Drug Use: No  . Sexual Activity: Not on file   Other Topics Concern  . Not on file   Social History Narrative   Widowed. Lives in [place].      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 Heart disease Paternal Grandmother   . Hypertension Paternal Grandmother   . Stomach cancer Maternal Grandmother   . Kidney disease Maternal Uncle   . Cancer Paternal Aunt     breast cancer    ROS - as per HPI. Noncontributory   Filed Vitals:   09/30/15 1033  BP: 126/60  Pulse: 69  Height: 5\' 6"  (1.676 m)  Weight: 134 lb 3.2 oz (60.873 kg)  SpO2: 97%    EXAM:   Gen: WDWN in NAD HEENT: All WNL Neck: NO LAN, no JVD noted Lungs: diminished BS, normal percussion note, no adventitious sounds Cardiovascular: normal rate, reg rhythm, no M noted Abdomen: Soft, NT +BS Ext: no C/C/E Neuro: CNs intact, motor/sens grossly intact Skin: No lesions noted  DATA:  BMP Latest Ref Rng 09/02/2015 09/07/2014  01/30/2014  Glucose 65 - 99 mg/dL 86 82 77  BUN 6 - 20 mg/dL 24(H) 22 25(H)  Creatinine 0.44 - 1.00 mg/dL 0.83 1.0 1.0  BUN/Creat Ratio 11 - 26 - - -  Sodium 135 - 145 mmol/L 137 141 137  Potassium 3.5 - 5.1 mmol/L 4.3 4.4 4.1  Chloride 101 - 111 mmol/L 107 108 104  CO2 22 - 32 mmol/L 25 23 25   Calcium 8.9 - 10.3 mg/dL 9.2 9.3 9.5    CBC    Component Value Date/Time   WBC 7.7 09/02/2015 1115   RBC 4.42 09/02/2015 1115   HGB 13.8 09/02/2015 1115   HCT 41.1 09/02/2015 1115   PLT 208 09/02/2015 1115   MCV 93.0 09/02/2015 1115   MCH 31.1 09/02/2015 1115   MCHC 33.5 09/02/2015 1115   RDW 16.7* 09/02/2015 1115   LYMPHSABS 1.4 09/07/2014 0906   MONOABS 0.4 09/07/2014 0906   EOSABS 0.1 09/07/2014 0906   BASOSABS 0.0 09/07/2014 0906    CT chest 09/02/15: no PE, no pulmonary abnormalities @LAB24 @  IMPRESSION:   Dyspnea - subacute. Does not appear to have cardiac etiology. Prior hx of asthma and smoking suggests possibility of COPD/chronic obstructive asthma Former smoker   PLAN:  Trial Breo - sample provided. She is to use sample until gone, then cycle off it to assess whether she obtains any symptomatic benefit from it ROV 4 wks with PFTs  Merton Border, MD PCCM service Mobile [phone] Pager [phone]

## 2015-09-30 NOTE — Patient Instructions (Signed)
Trial of Breo inhaler - use this every day in the morning Follow up in 4 wks or so with lung function tests prior to that visit

## 2015-10-05 ENCOUNTER — Encounter: Payer: Self-pay | Admitting: Internal Medicine

## 2015-10-05 ENCOUNTER — Other Ambulatory Visit: Payer: Self-pay

## 2015-10-05 ENCOUNTER — Telehealth: Payer: Self-pay | Admitting: *Deleted

## 2015-10-05 MED ORDER — VITAMIN C 500 MG PO TABS: 500.0000 mg | ORAL_TABLET | Freq: Every day | ORAL | Status: AC

## 2015-10-05 NOTE — Telephone Encounter (Signed)
Pt's med list updated.

## 2015-10-05 NOTE — Telephone Encounter (Signed)
Patient called and said she is taking 500 mg of vitamin c

## 2015-10-11 ENCOUNTER — Encounter: Payer: Self-pay | Admitting: Internal Medicine

## 2015-10-13 ENCOUNTER — Encounter: Payer: Self-pay | Admitting: Internal Medicine

## 2015-10-15 ENCOUNTER — Ambulatory Visit: Payer: Commercial Managed Care - HMO | Admitting: Internal Medicine

## 2015-10-28 ENCOUNTER — Ambulatory Visit (INDEPENDENT_AMBULATORY_CARE_PROVIDER_SITE_OTHER): Payer: PPO | Admitting: *Deleted

## 2015-10-28 DIAGNOSIS — R0600 Dyspnea, unspecified: Secondary | ICD-10-CM

## 2015-10-28 DIAGNOSIS — Z87891 Personal history of nicotine dependence: Secondary | ICD-10-CM

## 2015-10-28 LAB — PULMONARY FUNCTION TEST
DL/VA % pred: 103 %
DL/VA: 5.1 ml/min/mmHg/L
DLCO UNC % PRED: 76 %
DLCO UNC: 19.56 ml/min/mmHg
FEF 25-75 Post: 1.74 L/sec
FEF 25-75 Pre: 1.55 L/sec
FEF2575-%CHANGE-POST: 11 %
FEF2575-%Pred-Post: 107 %
FEF2575-%Pred-Pre: 95 %
FEV1-%CHANGE-POST: 2 %
FEV1-%PRED-POST: 79 %
FEV1-%Pred-Pre: 77 %
FEV1-PRE: 1.66 L
FEV1-Post: 1.7 L
FEV1FVC-%CHANGE-POST: 1 %
FEV1FVC-%Pred-Pre: 106 %
FEV6-%Change-Post: 1 %
FEV6-%PRED-PRE: 78 %
FEV6-%Pred-Post: 78 %
FEV6-PRE: 2.11 L
FEV6-Post: 2.14 L
FEV6FVC-%PRED-PRE: 105 %
FEV6FVC-%Pred-Post: 105 %
FVC-%Change-Post: 1 %
FVC-%PRED-PRE: 73 %
FVC-%Pred-Post: 74 %
FVC-POST: 2.14 L
FVC-Pre: 2.11 L
POST FEV1/FVC RATIO: 79 %
PRE FEV6/FVC RATIO: 100 %
Post FEV6/FVC ratio: 100 %
Pre FEV1/FVC ratio: 78 %

## 2015-10-28 NOTE — Progress Notes (Signed)
PFT performed today. 

## 2015-11-03 ENCOUNTER — Encounter: Payer: Self-pay | Admitting: Pulmonary Disease

## 2015-11-03 ENCOUNTER — Ambulatory Visit: Payer: PPO | Admitting: Pulmonary Disease

## 2015-11-03 VITALS — BP 126/64 | HR 87 | Ht 65.0 in | Wt 132.0 lb

## 2015-11-03 DIAGNOSIS — R0600 Dyspnea, unspecified: Secondary | ICD-10-CM

## 2015-11-03 DIAGNOSIS — J4530 Mild persistent asthma, uncomplicated: Secondary | ICD-10-CM

## 2015-11-03 NOTE — Patient Instructions (Signed)
Continue Breo as prescribed but you may try off of it for a week at a time to make certain that it is benefiting you Follow up in 3-4 months

## 2015-11-04 NOTE — Progress Notes (Signed)
PULMONARY OFFICE FOLLOW UP NOTE  Requesting MD/Service: Sharolyn Douglas, NP/Cardiology  Date of initial consultation: 09/30/15 Reason for consultation: Exertional dyspnea  Initial HPI:  85 F who has been evaluated by Cardiology for DOE X 3-4 months duration. She also reports chronic NP cough which is mild and present during both day and night and worse in the spring season. She has a prior history of childhood asthma which she "grew out of". Thus far, she has undergone echocardiogram which was normal, Myoview, also normal and CT angio of chest which revealed no PE. She was a smoker until her mid 31s. Her dyspnea is consistent with little day to day variation. She remains active, walking for exercise regularly. Her exercise tolerance is probably above average for a woman of her age and she is able to walk long distance as long as the pace is not too fast. She denies CP, orthopnea, PND, sinus symptoms, neck pain, LAN, F/C/S, unexplained weight loss, N/V/D, dysuria, LE edema, calf tenderness  INITIAL IMP/PLAN: Dyspnea - subacute. Does not appear to have cardiac etiology. Prior hx of asthma and smoking suggests possibility of COPD/chronic obstructive asthma Former smoker. Trial Breo. She is to use sample until gone, then cycle off it to assess whether she obtains any symptomatic benefit from it ROV 4 wks with PFTs  SUBJ: She feels Breo helped her dyspnea @ first but reports that she noted increased sneezing and emotional lability after starting it. These problems have largely resolved now. She still notes dyspnea from time to time with speech and a sensation of having to catch her breath. Overall, though there has been modest improvement in her respiratory symptoms and she has no new complaints  Filed Vitals:   11/03/15 0902  BP: 126/64  Pulse: 87  Height: 5\' 5"  (1.651 m)  Weight: 132 lb (59.875 kg)  SpO2: 99%    EXAM:  Gen: WDWN in NAD HEENT: All WNL Neck: NO LAN, no JVD noted Lungs: diminished BS,  no wheezes Cardiovascular: Reg, no M noted Abdomen: Soft, NT +BS Ext: no C/C/E Neuro: Grossly normal Skin: No lesions noted  DATA:  BMP Latest Ref Rng 09/02/2015 09/07/2014 01/30/2014  Glucose 65 - 99 mg/dL 86 82 77  BUN 6 - 20 mg/dL 24(H) 22 25(H)  Creatinine 0.44 - 1.00 mg/dL 0.83 1.0 1.0  BUN/Creat Ratio 11 - 26 - - -  Sodium 135 - 145 mmol/L 137 141 137  Potassium 3.5 - 5.1 mmol/L 4.3 4.4 4.1  Chloride 101 - 111 mmol/L 107 108 104  CO2 22 - 32 mmol/L 25 23 25   Calcium 8.9 - 10.3 mg/dL 9.2 9.3 9.5    CBC    Component Value Date/Time   WBC 7.7 09/02/2015 1115   RBC 4.42 09/02/2015 1115   HGB 13.8 09/02/2015 1115   HCT 41.1 09/02/2015 1115   PLT 208 09/02/2015 1115   MCV 93.0 09/02/2015 1115   MCH 31.1 09/02/2015 1115   MCHC 33.5 09/02/2015 1115   RDW 16.7* 09/02/2015 1115   LYMPHSABS 1.4 09/07/2014 0906   MONOABS 0.4 09/07/2014 0906   EOSABS 0.1 09/07/2014 0906   BASOSABS 0.0 09/07/2014 0906    PFTs 10/28/15: FVC (L) 2.11L 73% pred  FEV1 (L) 1.66L 77% pred  FEV1/FVC (%) 78  RV (N2) (L) 1.78L  74% pred TLC (N2) (L) 3.87L 74%pred DLCOunc 76% pred DL/VA 103% pred  Minimal restriction (probably insignificant as pt has relatively long legs, short torso)  IMPRESSION:   Dyspnea with exertion and  occasionally with speech. 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  Former smoker   PLAN:  Surveyor, mining - I have suggested that she may continue to try on and off it over time to make a more definitive assessment of its benefit ROV 4 months or as needed  Merton Border, MD PCCM service Mobile [phone] Pager [phone]

## 2015-11-18 ENCOUNTER — Encounter: Payer: Self-pay | Admitting: Internal Medicine

## 2015-11-18 ENCOUNTER — Ambulatory Visit (INDEPENDENT_AMBULATORY_CARE_PROVIDER_SITE_OTHER): Payer: PPO | Admitting: Internal Medicine

## 2015-11-18 VITALS — BP 131/70 | HR 78 | Temp 98.0°F | Ht 65.5 in | Wt 134.0 lb

## 2015-11-18 DIAGNOSIS — Z634 Disappearance and death of family member: Secondary | ICD-10-CM | POA: Insufficient documentation

## 2015-11-18 DIAGNOSIS — R0609 Other forms of dyspnea: Secondary | ICD-10-CM

## 2015-11-18 DIAGNOSIS — R490 Dysphonia: Secondary | ICD-10-CM

## 2015-11-18 MED ORDER — PANTOPRAZOLE SODIUM 40 MG PO TBEC: 40.0000 mg | DELAYED_RELEASE_TABLET | Freq: Every day | ORAL | Status: DC

## 2015-11-18 NOTE — Assessment & Plan Note (Signed)
Dyspnea with exertion. Evaluation to date reviewed with pt, including CTA chest which was normal, CXR normal, stress test normal, ECHO normal. Pulmonary added Breo and she had some improvement with this. Suspect bronchospasm playing a role. Encouraged her to continue Breo. Exam normal today. Follow up here in 4 weeks.

## 2015-11-18 NOTE — Assessment & Plan Note (Signed)
Persistent hoarseness. Evaluated by ENT and exam reportedly normal. Will add Pantoprazole for better control of reflux. Continue Ranitidine at night. Follow up in 4 weeks.

## 2015-11-18 NOTE — Assessment & Plan Note (Signed)
Her dog died this week. Offered support today. She has limited support at home/work. Discussed Hospice counseling services for patients after loss of pet. Follow up 4 weeks and prn.

## 2015-11-18 NOTE — Progress Notes (Signed)
Subjective:    Patient ID: Mary Fields, female    DOB: 1941/03/15, 75 y.o.   MRN: [id number]  HPI  75YO female presents for follow up.  Dyspnea - Symptoms improved on Breo. Worsened with trial off medication.  Hoarseness - Seen by Dr. Richardson Landry.  Exam was normal. Told that hoarseness likely secondary to reflux. Increased Ranitidine to 300mg  twice daily.  Difficult week for her. Had to put down her dog on Monday. 16YO. She is tearful describing this today. Unable to talk with anyone about this.  Wt Readings from Last 3 Encounters:  11/18/15 134 lb (60.782 kg)  11/03/15 132 lb (59.875 kg)  09/30/15 134 lb 3.2 oz (60.873 kg)   BP Readings from Last 3 Encounters:  11/18/15 131/70  11/03/15 126/64  09/30/15 126/60    Past Medical History  Diagnosis Date  . Other malaise and fatigue   . Benign neoplasm of skin, site unspecified     Dr. Aubery Lapping  . 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 Dizziness and giddiness   . Palpitations   . Herpes simplex without mention of complication   . Ulcerative colitis, unspecified   . Malignant neoplasm of kidney, except pelvis   . Disorder of bone and cartilage, unspecified   . Esophageal reflux   . Personal history of other malignant neoplasm of skin   . Personal history of malignant neoplasm of breast   . Hx-TIA (transient ischemic attack)   . Atypical nevi   . Diverticulosis   . Erosive esophagitis   . Colitis   . Unspecified asthma(493.90)     since childhood  . Hip fracture ([place]) 11/12    surgery  . Vaginal atrophy   . Vaginal stenosis   . Palpitations     a. 07/2015 Echo: EF 60-65%, Gr 1 DD, mild AI.  Marland Kitchen Dyspnea on exertion   . Tobacco abuse    Family History  Problem Relation Age of Onset  . Heart disease Paternal Grandmother   . Hypertension Paternal Grandmother   . Stomach cancer Maternal Grandmother   . Kidney disease Maternal Uncle   . Cancer Paternal Aunt     breast cancer    Past Surgical History  Procedure Laterality Date  . Melanoma excision  1980    Dr. Sharlet Salina  . Nephrectomy  2000    left renal cell carcinoma, Dr. Jacqlyn Larsen  . Hip fracture surgery  2005    hemiarthroplasty right, Dr. Francia Greaves  . Esophagogastroduodenoscopy  2002    esophagitis; grade 1, erosive  . Colonoscopy  4/10    diverticulosis; recheck 5 years  . Appendectomy    . Abdominal hysterectomy  1986    endometriosis, Dr. Pearletha Furl  . Breast lumpectomy  1995    right breast cancer, lumpectomy and XRT and chemo  . Hip fracture surgery  11/12    after fall at [facility] History  . Marital Status: Widowed    Spouse Name: N/A  . Number of Children: N/A  . Years of Education: N/A   Social History Main Topics  . Smoking status: Former Smoker    Quit date: 08/02/1984  . Smokeless tobacco: None  . Alcohol Use: Yes     Comment: occassionaly  . Drug Use: No  . Sexual Activity: Not Asked   Other Topics Concern  . None   Social History Narrative   Widowed. Lives in [place].  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  HENT: Positive for voice change. Negative for postnasal drip, rhinorrhea and sore throat.   Eyes: Negative for visual disturbance.  Respiratory: Positive for shortness of breath. Negative for cough, chest tightness and wheezing.   Cardiovascular: Negative for chest pain and leg swelling.  Gastrointestinal: Negative for nausea, vomiting, abdominal pain, diarrhea and constipation.  Skin: Negative for color change and rash.  Hematological: Negative for adenopathy. Does not bruise/bleed easily.  Psychiatric/Behavioral: Positive for dysphoric mood. Negative for suicidal ideas and sleep disturbance. The patient is not nervous/anxious.        Objective:    BP 131/70 mmHg  Pulse 78  Temp(Src) 98 F (36.7 C)  (Oral)  Ht 5' 5.5" (1.664 m)  Wt 134 lb (60.782 kg)  BMI 21.95 kg/m2  SpO2 96% Physical Exam  Constitutional: She is oriented to person, place, and time. She appears well-developed and well-nourished. No distress.  HENT:  Head: Normocephalic and atraumatic.  Right Ear: External ear normal.  Left Ear: External ear normal.  Nose: Nose normal.  Mouth/Throat: Oropharynx is clear and moist. No oropharyngeal exudate.  Eyes: Conjunctivae are normal. Pupils are equal, round, and reactive to light. Right eye exhibits no discharge. Left eye exhibits no discharge. No scleral icterus.  Neck: Normal range of motion. Neck supple. No tracheal deviation present. No thyromegaly present.  Cardiovascular: Normal rate, regular rhythm, normal heart sounds and intact distal pulses.  Exam reveals no gallop and no friction rub.   No murmur heard. Pulmonary/Chest: Effort normal and breath sounds normal. No respiratory distress. She has no wheezes. She has no rales. She exhibits no tenderness.  Musculoskeletal: Normal range of motion. She exhibits no edema or tenderness.  Lymphadenopathy:    She has no cervical adenopathy.  Neurological: She is alert and oriented to person, place, and time. No cranial nerve deficit. She exhibits normal muscle tone. Coordination normal.  Skin: Skin is warm and dry. No rash noted. She is not diaphoretic. No erythema. No pallor.  Psychiatric: Her speech is normal and behavior is normal. Judgment and thought content normal. She exhibits a depressed mood. She expresses no suicidal ideation.          Assessment & Plan:   Problem List Items Addressed This Visit      Unprioritized   Bereavement    Her dog died this week. Offered support today. She has limited support at home/work. Discussed Hospice counseling services for patients after loss of pet. Follow up 4 weeks and prn.      Dyspnea on exertion - Primary    Dyspnea with exertion. Evaluation to date reviewed with pt,  including CTA chest which was normal, CXR normal, stress test normal, ECHO normal. Pulmonary added Breo and she had some improvement with this. Suspect bronchospasm playing a role. Encouraged her to continue Breo. Exam normal today. Follow up here in 4 weeks.      Hoarseness    Persistent hoarseness. Evaluated by ENT and exam reportedly normal. Will add Pantoprazole for better control of reflux. Continue Ranitidine at night. Follow up in 4 weeks.          Return in about 4 weeks (around 12/16/2015) for Physical.

## 2015-11-18 NOTE — Progress Notes (Signed)
Pre visit review using our clinic review tool, if applicable. No additional management support is needed unless otherwise documented below in the visit note. 

## 2015-11-18 NOTE — Patient Instructions (Addendum)
Start Pantoprazole 40mg  in the morning and continue Ranitidine 300mg  in the afternoon.  Follow up in 4 weeks for physical exam.

## 2015-11-22 ENCOUNTER — Encounter: Payer: Self-pay | Admitting: Internal Medicine

## 2015-12-02 ENCOUNTER — Ambulatory Visit: Payer: Commercial Managed Care - HMO | Admitting: Cardiovascular Disease

## 2015-12-14 ENCOUNTER — Encounter: Payer: Self-pay | Admitting: Internal Medicine

## 2015-12-16 ENCOUNTER — Encounter: Payer: Self-pay | Admitting: Internal Medicine

## 2015-12-16 ENCOUNTER — Ambulatory Visit (INDEPENDENT_AMBULATORY_CARE_PROVIDER_SITE_OTHER): Payer: PPO | Admitting: Internal Medicine

## 2015-12-16 VITALS — BP 128/75 | HR 79 | Temp 98.2°F | Ht 65.25 in | Wt 134.2 lb

## 2015-12-16 DIAGNOSIS — R4701 Aphasia: Secondary | ICD-10-CM | POA: Diagnosis not present

## 2015-12-16 DIAGNOSIS — R252 Cramp and spasm: Secondary | ICD-10-CM

## 2015-12-16 DIAGNOSIS — R413 Other amnesia: Secondary | ICD-10-CM

## 2015-12-16 DIAGNOSIS — R471 Dysarthria and anarthria: Secondary | ICD-10-CM | POA: Insufficient documentation

## 2015-12-16 DIAGNOSIS — Z0000 Encounter for general adult medical examination without abnormal findings: Secondary | ICD-10-CM | POA: Diagnosis not present

## 2015-12-16 LAB — COMPREHENSIVE METABOLIC PANEL
ALBUMIN: 4.3 g/dL (ref 3.5–5.2)
ALK PHOS: 48 U/L (ref 39–117)
ALT: 11 U/L (ref 0–35)
AST: 16 U/L (ref 0–37)
BUN: 20 mg/dL (ref 6–23)
CALCIUM: 9.1 mg/dL (ref 8.4–10.5)
CHLORIDE: 104 meq/L (ref 96–112)
CO2: 28 mEq/L (ref 19–32)
Creatinine, Ser: 0.94 mg/dL (ref 0.40–1.20)
GFR: 61.35 mL/min (ref >60.00)
Glucose, Bld: 87 mg/dL (ref 70–99)
POTASSIUM: 4.2 meq/L (ref 3.5–5.1)
SODIUM: 138 meq/L (ref 135–145)
Total Bilirubin: 0.4 mg/dL (ref 0.2–1.2)
Total Protein: 7 g/dL (ref 6.0–8.3)

## 2015-12-16 LAB — LIPID PANEL
CHOLESTEROL: 150 mg/dL (ref 0–200)
HDL: 59.1 mg/dL (ref >39.00)
LDL CALC: 52 mg/dL (ref 0–99)
NonHDL: 90.66
TRIGLYCERIDES: 195 mg/dL — AB (ref 0.0–149.0)
Total CHOL/HDL Ratio: 3
VLDL: 39 mg/dL (ref 0.0–40.0)

## 2015-12-16 LAB — TSH: TSH: 1.27 u[IU]/mL (ref 0.35–4.50)

## 2015-12-16 LAB — MAGNESIUM: MAGNESIUM: 2.1 mg/dL (ref 1.5–2.5)

## 2015-12-16 LAB — VITAMIN B12: Vitamin B-12: 374 pg/mL (ref 211–911)

## 2015-12-16 MED ORDER — RANITIDINE HCL 300 MG PO TABS: 300.0000 mg | ORAL_TABLET | Freq: Every day | ORAL | Status: DC

## 2015-12-16 NOTE — Assessment & Plan Note (Signed)
Memory loss noted by friend's of patients. Exam normal except as noted today. Will check TSH and B12 with labs. MRI brain ordered as noted.

## 2015-12-16 NOTE — Patient Instructions (Addendum)
We will set up an MRI of your brain to further evaluate your hoarseness and trouble with word finding.  Labs today.  Follow up in 4 weeks.

## 2015-12-16 NOTE — Progress Notes (Signed)
Pre visit review using our clinic review tool, if applicable. No additional management support is needed unless otherwise documented below in the visit note. 

## 2015-12-16 NOTE — Progress Notes (Signed)
Subjective:    Patient ID: Mary Fields, female    DOB: 10-16-1941, 75 y.o.   MRN: [id number]  HPI  75YO female presents for follow up. (initially scheduled as physical exam, but changed given multiple concerns)  Recently tried stopping her Breo, but had worsening shortness of breath, so restarted. Some improvement. Has follow up with Dr. Leonidas Romberg.  Friends have noted that she is losing her memory. She wonders if there is something she can do for this? She has not noted any change. Thinks her memory has been poor for a long time. Continues to manage all of her finances. Drives. Completes all ADLs. In process of selling her home and moving to ALF.  Cramping in feet every night. Ongoing for months. Wakes her from sleep. Not taking anything for this.  Continues to have hoarse voice and trouble "getting words out." Seen by ENT and reports exam was normal including exam of vocal cords. Also has some trouble with feeling like she gets "choked" on liquids on occasion. Hoarseness has been getting gradually worse over more than 6 months. No weakness, falls, numbness.  Wt Readings from Last 3 Encounters:  12/16/15 134 lb 4 oz (60.895 kg)  11/18/15 134 lb (60.782 kg)  11/03/15 132 lb (59.875 kg)   BP Readings from Last 3 Encounters:  12/16/15 128/75  11/18/15 131/70  11/03/15 126/64    Past Medical History  Diagnosis Date  . Other malaise and fatigue   . Benign neoplasm of skin, site unspecified     Dr. Aubery Lapping  . 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 Dizziness and giddiness   . Palpitations   . Herpes simplex without mention of complication   . Ulcerative colitis, unspecified   . Malignant neoplasm of kidney, except pelvis   . Disorder of bone and cartilage, unspecified   . Esophageal reflux   . Personal history of other malignant neoplasm of skin   . Personal history of malignant neoplasm of breast   . Hx-TIA (transient ischemic attack)   .  Atypical nevi   . Diverticulosis   . Erosive esophagitis   . Colitis   . Unspecified asthma(493.90)     since childhood  . Hip fracture ([place]) 11/12    surgery  . Vaginal atrophy   . Vaginal stenosis   . Palpitations     a. 07/2015 Echo: EF 60-65%, Gr 1 DD, mild AI.  Marland Kitchen Dyspnea on exertion   . Tobacco abuse    Family History  Problem Relation Age of Onset  . Heart disease Paternal Grandmother   . Hypertension Paternal Grandmother   . Stomach cancer Maternal Grandmother   . Kidney disease Maternal Uncle   . Cancer Paternal Aunt     breast cancer   Past Surgical History  Procedure Laterality Date  . Melanoma excision  1980    Dr. Sharlet Salina  . Nephrectomy  2000    left renal cell carcinoma, Dr. Jacqlyn Larsen  . Hip fracture surgery  2005    hemiarthroplasty right, Dr. Francia Greaves  . Esophagogastroduodenoscopy  2002    esophagitis; grade 1, erosive  . Colonoscopy  4/10    diverticulosis; recheck 5 years  . Appendectomy    . Abdominal hysterectomy  1986    endometriosis, Dr. Pearletha Furl  . Breast lumpectomy  1995    right breast cancer, lumpectomy and XRT and chemo  . Hip fracture surgery  11/12    [facility] fall at [facility]  Social History   Social History  . Marital Status: Widowed    Spouse Name: N/A  . Number of Children: N/A  . Years of Education: N/A   Social History Main Topics  . Smoking status: Former Smoker    Quit date: 08/02/1984  . Smokeless tobacco: None  . Alcohol Use: Yes     Comment: occassionaly  . Drug Use: No  . Sexual Activity: Not Asked   Other Topics Concern  . None   Social History Narrative   Widowed. Lives in [place].      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  HENT: Positive for trouble swallowing and voice change. Negative for congestion, postnasal drip, rhinorrhea and sinus pressure.   Eyes: Negative for visual  disturbance.  Respiratory: Positive for shortness of breath. Negative for cough.   Cardiovascular: Negative for chest pain and leg swelling.  Gastrointestinal: Negative for nausea, vomiting, abdominal pain, diarrhea and constipation.  Musculoskeletal: Negative for myalgias and arthralgias.  Skin: Negative for color change and rash.  Neurological: Positive for speech difficulty. Negative for dizziness, tremors, seizures, syncope, facial asymmetry, weakness, light-headedness, numbness and headaches.  Hematological: Negative for adenopathy. Does not bruise/bleed easily.  Psychiatric/Behavioral: Positive for decreased concentration. Negative for suicidal ideas, sleep disturbance and dysphoric mood. The patient is not nervous/anxious.        Objective:    BP 128/75 mmHg  Pulse 79  Temp(Src) 98.2 F (36.8 C) (Oral)  Ht 5' 5.25" (1.657 m)  Wt 134 lb 4 oz (60.895 kg)  BMI 22.18 kg/m2  SpO2 97% Physical Exam  Constitutional: She is oriented to person, place, and time. She appears well-developed and well-nourished. No distress.  HENT:  Head: Normocephalic and atraumatic.  Right Ear: External ear normal.  Left Ear: External ear normal.  Nose: Nose normal.  Mouth/Throat: Oropharynx is clear and moist. No oropharyngeal exudate.  Eyes: Conjunctivae are normal. Pupils are equal, round, and reactive to light. Right eye exhibits no discharge. Left eye exhibits no discharge. No scleral icterus.  Neck: Normal range of motion. Neck supple. No tracheal deviation present. No thyromegaly present.  Cardiovascular: Normal rate, regular rhythm, normal heart sounds and intact distal pulses.  Exam reveals no gallop and no friction rub.   No murmur heard. Pulmonary/Chest: Effort normal and breath sounds normal. No respiratory distress. She has no wheezes. She has no rales. She exhibits no tenderness.  Musculoskeletal: Normal range of motion. She exhibits no edema or tenderness.  Lymphadenopathy:    She has  no cervical adenopathy.  Neurological: She is alert and oriented to person, place, and time. She is not disoriented. She displays no atrophy and no tremor. No cranial nerve deficit or sensory deficit. She exhibits normal muscle tone. Coordination and gait normal.  Speech is slowed compared with previous visits, with worsening of hoarseness of voice.  Skin: Skin is warm and dry. No rash noted. She is not diaphoretic. No erythema. No pallor.  Psychiatric: She has a normal mood and affect. Her behavior is normal. Judgment and thought content normal.          Assessment & Plan:   Problem List Items Addressed This Visit      Unprioritized   Dysarthria - Primary    Exam today is remarkable for slowing of speech and hoarse voice. This is much more prominent than  on previous exam. She also notes expressive aphasia. Symptoms are concerning for CVA. Previous evaluation for hoarseness by ENT included normal direct visualization of the vocal cords. Will get MRI brain for evaluation.      Relevant Orders   MR Brain Wo Contrast   Ambulatory referral to Neurology   Expressive aphasia    Expressive aphasia noted by pt. Will get MRI brain for evaluation of CVA. Will also set up neurology evaluation.      Relevant Orders   Ambulatory referral to Neurology   Memory loss    Memory loss noted by friend's of patients. Exam normal except as noted today. Will check TSH and B12 with labs. MRI brain ordered as noted.      Relevant Orders   TSH   B12   Muscle cramps at night    Nocturnal leg cramps. Recommended adequate fluid intake. Will check electrolytes, B12 with labs.      Relevant Orders   Magnesium   Routine general medical examination at a health care facility   Relevant Orders   Lipid panel   Comprehensive metabolic panel     Over [id number] of which >50% spent in face-to-face contact with patient discussing plan of care   Return in about 2 weeks (around 12/30/2015) for Recheck.  Ronette Deter, MD [facility]

## 2015-12-16 NOTE — Assessment & Plan Note (Signed)
Nocturnal leg cramps. Recommended adequate fluid intake. Will check electrolytes, B12 with labs.

## 2015-12-16 NOTE — Assessment & Plan Note (Signed)
Exam today is remarkable for slowing of speech and hoarse voice. This is much more prominent than on previous exam. She also notes expressive aphasia. Symptoms are concerning for CVA. Previous evaluation for hoarseness by ENT included normal direct visualization of the vocal cords. Will get MRI brain for evaluation.

## 2015-12-16 NOTE — Assessment & Plan Note (Signed)
Expressive aphasia noted by pt. Will get MRI brain for evaluation of CVA. Will also set up neurology evaluation.

## 2015-12-17 ENCOUNTER — Encounter: Payer: Self-pay | Admitting: Internal Medicine

## 2015-12-22 ENCOUNTER — Other Ambulatory Visit: Payer: Self-pay

## 2015-12-22 ENCOUNTER — Ambulatory Visit (INDEPENDENT_AMBULATORY_CARE_PROVIDER_SITE_OTHER): Payer: PPO

## 2015-12-22 DIAGNOSIS — E538 Deficiency of other specified B group vitamins: Secondary | ICD-10-CM

## 2015-12-22 MED ORDER — RANITIDINE HCL 300 MG PO TABS: 300.0000 mg | ORAL_TABLET | Freq: Every day | ORAL | Status: AC

## 2015-12-22 MED ORDER — CYANOCOBALAMIN 1000 MCG/ML IJ SOLN
1000.0000 ug | Freq: Once | INTRAMUSCULAR | Status: AC
  Administered 2015-12-22: 1000 ug via INTRAMUSCULAR

## 2015-12-22 NOTE — Progress Notes (Signed)
Patient came in for 1st b12 injection.  Received in left deltoid.  Patient tolerated well.

## 2015-12-27 ENCOUNTER — Encounter: Payer: Self-pay | Admitting: Neurology

## 2015-12-27 ENCOUNTER — Encounter: Payer: Self-pay | Admitting: Internal Medicine

## 2015-12-27 ENCOUNTER — Other Ambulatory Visit: Payer: PPO

## 2015-12-27 ENCOUNTER — Ambulatory Visit (INDEPENDENT_AMBULATORY_CARE_PROVIDER_SITE_OTHER): Payer: PPO | Admitting: Neurology

## 2015-12-27 VITALS — BP 144/82 | HR 77 | Ht 65.0 in | Wt 137.0 lb

## 2015-12-27 DIAGNOSIS — R499 Unspecified voice and resonance disorder: Secondary | ICD-10-CM | POA: Diagnosis not present

## 2015-12-27 DIAGNOSIS — W19XXXA Unspecified fall, initial encounter: Secondary | ICD-10-CM | POA: Diagnosis not present

## 2015-12-27 DIAGNOSIS — G255 Other chorea: Secondary | ICD-10-CM | POA: Diagnosis not present

## 2015-12-27 LAB — SEDIMENTATION RATE: SED RATE: 8 mm/h (ref 0–30)

## 2015-12-27 LAB — FERRITIN: FERRITIN: 168 ng/mL (ref 20–288)

## 2015-12-27 NOTE — Patient Instructions (Signed)
1. Your provider has requested that you have labwork completed today. Please go to [facility] ([id number]) on the second floor of this building before leaving the office today. You do not need to check in. If you are not called within 15 minutes please check with the front desk.   

## 2015-12-27 NOTE — Progress Notes (Signed)
Mary Fields was seen today in the movement disorders clinic for neurologic consultation at the request of Rica Mast, MD.  The consultation is for the evaluation of voice changes.  This has been going on for 6 months.  She previously consulted with ENT regarding this but I don't have those records.  She states that she had a scope and it was normal.  Pt states that her voice is "hoarse" and sometimes it is so "hoarse I can barely talk."  She is lower pitch than in the past.  She admits to GERD and states that she was told to take an additional ranitidine at 4 pm (was already taking one upon awakening) but it didn't help.  The records that were made available to me were reviewed from her PCP.  According to records, the patient has also had cramping of the feet, swallowing changes (pt reports that only happens when laying down in the bed and will get choked on saliva) and word finding trouble.  She has an MRI pending for 01/05/16 at [facility].  States that the word finding issues have been gradually coming on for a year and sometimes she will have to describe what is happening.  Admits to tons of stress; her dog of 16 years just died in Nov 06, 2022 and she just put her house up for sale.   Also states that when she was child she had asthma and her mother brought her to a clinic and they treated her with arsenic and she wonders if this, along with her cancers, were from the treatements that she received.     Specific Symptoms:  Tremor: no but admits that she has extra movements in her fingers; no fam hx of neurologic diseases; movements in fingers x 1.5 years.  Doesn't notice it enough to give more detail.  Doesn't live with anyone. Sleep: not sleeping well since the death of her dog Postural symptoms:  No.  Falls?  Yes.  , fell yesterday in a driveway when she stubbed her toe in a driveway. Bradykinesia symptoms: trouble getting in and out of her car Loss of smell:  No. Loss of taste:   No. Urinary Incontinence:  Yes.   (just urinary urgency) Handwriting, micrographia: No. Trouble with ADL's:  No.  Trouble buttoning clothing: No. Depression:  Yes.   (recently especially but admits that she has been alone for a long time and she "is tired of that") Memory changes:  Yes.   (still able to manage finances, drive, remembering to take medications) Hallucinations:  No.  visual distortions: No. (had some in the past but better) N/V:  No. Lightheaded:  No.  Syncope: No. Diplopia:  No.   Neuroimaging has  previously been performed.  It is available for my review today.  Had an MRI in 2007 that demonstrated multiple scattered T2 hyperintensities.     ALLERGIES:   Allergies  Allergen Reactions  . Ciprofloxacin     REACTION: diarrhea  . Meperidine     Other reaction(s): Other (See Comments)  . Prednisone Hives  . Sulfonamide Derivatives     REACTION: whelps    CURRENT MEDICATIONS:  Outpatient Encounter Prescriptions as of 12/27/2015  Medication Sig  . acyclovir (ZOVIRAX) 400 MG tablet Take 1 tablet (400 mg total) by mouth 4 (four) times daily.  Marland Kitchen aspirin 81 MG tablet Take 81 mg by mouth every other day.   . Estrogens, Conjugated (PREMARIN VA) Place 1 g vaginally once a week.  Marland Kitchen  Fluticasone Furoate-Vilanterol (BREO ELLIPTA) 100-25 MCG/INH AEPB Inhale 1 puff into the lungs daily.  . Multiple Vitamin (MULTIVITAMIN) tablet Take 1 tablet by mouth daily.    . NON FORMULARY Ease Takes 1 tablet by mouth daily.  . pantoprazole (PROTONIX) 40 MG tablet Take 1 tablet (40 mg total) by mouth daily.  Marland Kitchen Propylene Glycol (SYSTANE BALANCE OP) 1 drop daily.   . ranitidine (ZANTAC) 300 MG tablet Take 1 tablet (300 mg total) by mouth at bedtime.  . vitamin C (ASCORBIC ACID) 500 MG tablet Take 1 tablet (500 mg total) by mouth daily.  . [DISCONTINUED] PREMARIN vaginal cream Place vaginally 2 (two) times a week. APPLY 1/2 GRAM VAGINALLY TWICE A WEEK   No facility-administered encounter  medications on file as of 12/27/2015.    PAST MEDICAL HISTORY:   Past Medical History  Diagnosis Date  . Other malaise and fatigue   . Benign neoplasm of skin, site unspecified     Dr. Aubery Lapping  . 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 Dizziness and giddiness   . Palpitations   . Herpes simplex without mention of complication   . Ulcerative colitis, unspecified   . Malignant neoplasm of kidney, except pelvis   . Disorder of bone and cartilage, unspecified   . Esophageal reflux   . Personal history of other malignant neoplasm of skin   . Personal history of malignant neoplasm of breast   . Hx-TIA (transient ischemic attack)   . Atypical nevi   . Diverticulosis   . Erosive esophagitis   . Colitis   . Unspecified asthma(493.90)     since childhood  . Hip fracture ([place]) 11/12    surgery  . Vaginal atrophy   . Vaginal stenosis   . Palpitations     a. 07/2015 Echo: EF 60-65%, Gr 1 DD, mild AI.  Marland Kitchen Dyspnea on exertion   . Tobacco abuse     PAST SURGICAL HISTORY:   Past Surgical History  Procedure Laterality Date  . Melanoma excision  1980    Dr. Sharlet Salina  . Nephrectomy  2000    left renal cell carcinoma, Dr. Jacqlyn Larsen  . Hip fracture surgery Right 2005    hemiarthroplasty right, Dr. Francia Greaves  . Esophagogastroduodenoscopy  2002    esophagitis; grade 1, erosive  . Colonoscopy  4/10    diverticulosis; recheck 5 years  . Appendectomy    . Abdominal hysterectomy  1986    endometriosis, Dr. Pearletha Furl  . Breast lumpectomy Right 1995    right breast cancer, lumpectomy and XRT and chemo  . Hip fracture surgery Left 11/12    after fall at [facility]    SOCIAL HISTORY:   Social History   Social History  . Marital Status: Widowed    Spouse Name: N/A  . Number of Children: N/A  . Years of Education: N/A   Occupational History  . Not on file.   Social History Main Topics  . Smoking status: Former Smoker    Quit date: 08/02/1984  . Smokeless  tobacco: Not on file  . Alcohol Use: 0.0 oz/week    0 Standard drinks or equivalent per week     Comment: 1 beer once a month  . Drug Use: No  . Sexual Activity: Not on file   Other Topics Concern  . Not on file   Social History Narrative   Widowed. Lives in [place].      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 Paternal Grandmother Deceased   . Mother Deceased 94    healthy  . Father Deceased     emphysema (smoker)  . Sister Alive     healthy    ROS:  A complete 10 system review of systems was obtained and was unremarkable apart from what is mentioned above.  PHYSICAL EXAMINATION:    VITALS:   Filed Vitals:   12/27/15 0943  BP: 144/82  Pulse: 77  Height: 5\' 5"  (1.651 m)  Weight: 137 lb (62.143 kg)    GEN:  The patient appears stated age and is in NAD.  Tearful at times. HEENT:  Normocephalic, atraumatic.  The mucous membranes are moist. The superficial temporal arteries are without ropiness or tenderness. CV:  RRR Lungs:  CTAB Neck/HEME:  There are no carotid bruits bilaterally. MS:  Very deep and elongated bruise over right hip from fall yesterday.  Neurological examination:  Orientation: The patient is alert and oriented x3. Fund of knowledge is appropriate.  Recent and remote memory are intact.  Attention and concentration are normal.    Able to name objects and repeat phrases. Cranial nerves: There is good facial symmetry. Pupils are equal round and reactive to light bilaterally. Fundoscopic exam reveals clear margins bilaterally. Extraocular muscles are intact. The visual fields are full to confrontational testing. The speech is fluent and clear.  No hypophonic speech.  Minimal word finding trouble.  No significant dysphasia. Soft palate rises symmetrically and there is no tongue deviation. Hearing is intact to conversational tone. Sensation: Sensation is intact to light and pinprick  throughout (facial, trunk, extremities). Vibration is Decreased at the bilateral big toe. There is no extinction with double simultaneous stimulation. There is no sensory dermatomal level identified. Motor: Strength is 5/5 in the bilateral upper and lower extremities.   Shoulder shrug is equal and symmetric.  There is no pronator drift. Deep tendon reflexes: Deep tendon reflexes are 2/4 at the bilateral biceps, triceps, brachioradialis, patella and 1/4 at the bilateral achilles. Plantar responses are downgoing bilaterally.  Movement examination: Tone: There is normal tone in the bilateral upper extremities.  The tone in the lower extremities is normal.  Abnormal movements: There is choreiform like movements in the fingers of the right greater than left upper extremity.  There is also some choreiform movements of the bilateral lower extremities, but not as extreme as in the upper extremities. Coordination:  There is only trouble with decremation rapid alternating movements with toe taps on the left.  All other forms of rapid alternating movements were normal.  Gait and Station: The patient has no difficulty arising out of a deep-seated chair without the use of the hands. The patient's stride length is normal, but there is evidence of chorea in the fingers when she walks.     Chemistry      Component Value Date/Time   NA 138 12/16/2015 1058   NA 139 08/02/2012 0849   K 4.2 12/16/2015 1058   CL 104 12/16/2015 1058   CO2 28 12/16/2015 1058   BUN 20 12/16/2015 1058   BUN 15 08/02/2012 0849   CREATININE 0.94 12/16/2015 1058      Component Value Date/Time   CALCIUM 9.1 12/16/2015 1058   ALKPHOS 48 12/16/2015 1058   AST 16 12/16/2015 1058   ALT 11 12/16/2015 1058   BILITOT 0.4 12/16/2015 1058  Lab Results  Component Value Date   TSH 1.27 12/16/2015   No results found for: Micheline Rough   Lab Results  Component Value Date   [id number] 12/16/2015      ASSESSMENT/PLAN:  1.  Chorea  -The patient does have evidence of choreiform like movements in her right greater than left upper extremity and bilateral lower extremities.  She reports that she has had these movements in the fingers for over a year and a half.  I will do lab work as follows:  ceruloplasmin, copper, PTH, ferritin, sedimentation rate, ANA, antiphospholipid antibody, lupus anticoagulant, RPR, antigliadin antibody, heavy metal screen [facility] labs:   HD labs  -I agree with the MRI of the brain, which she has pending for 01/05/2016.  I do not, however, see anything distinctly focal or lateralizing on her examination. 2.  Voice change  -I did not hear anything abnormal from a neurologic standpoint in her voice today.  She was not hypophonic.  I am not sure that this is related to the abnormal movements that we saw today.  We will undertake the above workup and see if it shows anything. 3.  Fall  -The patient had a fall yesterday and has a large area of ecchymosis over the right hip.  She actually came out to me after the visit was over and found me in the hall and took off her pants in the hall to show me this.  I did recommend that she make an appointment with her primary care physician to discuss this. 4.  We will determine follow-up based on the results of the above testing.  Much greater than 50% of this visit was spent in counseling/coordinating care.  Total face to face time:  60 min

## 2015-12-28 ENCOUNTER — Other Ambulatory Visit: Payer: Self-pay | Admitting: Obstetrics and Gynecology

## 2015-12-28 ENCOUNTER — Ambulatory Visit (INDEPENDENT_AMBULATORY_CARE_PROVIDER_SITE_OTHER): Payer: PPO | Admitting: Internal Medicine

## 2015-12-28 ENCOUNTER — Ambulatory Visit
Admission: RE | Admit: 2015-12-28 | Discharge: 2015-12-28 | Disposition: A | Discharge status: Home / Self Care | Payer: PPO | Source: Ambulatory Visit | Attending: Internal Medicine | Admitting: Internal Medicine

## 2015-12-28 ENCOUNTER — Encounter: Payer: Self-pay | Admitting: Internal Medicine

## 2015-12-28 VITALS — BP 127/70 | HR 73 | Temp 97.7°F | Wt 136.5 lb

## 2015-12-28 DIAGNOSIS — W19XXXS Unspecified fall, sequela: Secondary | ICD-10-CM | POA: Diagnosis not present

## 2015-12-28 DIAGNOSIS — Z96643 Presence of artificial hip joint, bilateral: Secondary | ICD-10-CM | POA: Diagnosis not present

## 2015-12-28 DIAGNOSIS — R0781 Pleurodynia: Secondary | ICD-10-CM | POA: Diagnosis not present

## 2015-12-28 DIAGNOSIS — W19XXXA Unspecified fall, initial encounter: Secondary | ICD-10-CM | POA: Insufficient documentation

## 2015-12-28 DIAGNOSIS — S7001XA Contusion of right hip, initial encounter: Secondary | ICD-10-CM | POA: Diagnosis not present

## 2015-12-28 DIAGNOSIS — M25551 Pain in right hip: Secondary | ICD-10-CM | POA: Diagnosis not present

## 2015-12-28 DIAGNOSIS — S299XXA Unspecified injury of thorax, initial encounter: Secondary | ICD-10-CM | POA: Diagnosis not present

## 2015-12-28 LAB — ANA: ANA: POSITIVE — AB

## 2015-12-28 LAB — ANTI-NUCLEAR AB-TITER (ANA TITER): ANA Titer 1: 1:80 {titer} — ABNORMAL HIGH

## 2015-12-28 LAB — PTH, INTACT AND CALCIUM
CALCIUM: 9 mg/dL (ref 8.4–10.5)
PTH: 54 pg/mL (ref 14–64)

## 2015-12-28 LAB — RPR

## 2015-12-28 LAB — GLIADIN ANTIBODIES, SERUM
Gliadin IgA: 5 Units (ref <20)
Gliadin IgG: 4 Units (ref <20)

## 2015-12-28 NOTE — Assessment & Plan Note (Signed)
Recent fall after tripping on concrete. Several ecchymoses and contusions noted. Will get CXR and right hip plain xray for evaluation.

## 2015-12-28 NOTE — Patient Instructions (Addendum)
Xray of right hip and chest.  Follow up 1 week

## 2015-12-28 NOTE — Progress Notes (Signed)
Subjective:    Patient ID: Mary Fields, female    DOB: [birth year], 75 y.o.   MRN: [id number]  HPI  75YO female presents for acute visit.  Fall [place] on Sunday morning.Tripped on driveway, falling on driveway and grass.  Hit head, but did not lose consciousness.  Bruised right hip and left arm. Chest wall/ribs feel sore and right shoulder is sore. Large bruise right hip.  Seen by Dr. Carles Collet. Additional testing pending to evaluate abnormal movements and changes in voice.   Wt Readings from Last 3 Encounters:  12/28/15 136 lb 8 oz (61.916 kg)  12/27/15 137 lb (62.143 kg)  12/16/15 134 lb 4 oz (60.895 kg)   BP Readings from Last 3 Encounters:  12/28/15 127/70  12/27/15 144/82  12/16/15 128/75    Past Medical History  Diagnosis Date  . Other malaise and fatigue   . Benign neoplasm of skin, site unspecified     Dr. Aubery Lapping  . 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 Dizziness and giddiness   . Palpitations   . Herpes simplex without mention of complication   . Ulcerative colitis, unspecified   . Malignant neoplasm of kidney, except pelvis   . Disorder of bone and cartilage, unspecified   . Esophageal reflux   . Personal history of other malignant neoplasm of skin   . Personal history of malignant neoplasm of breast   . Hx-TIA (transient ischemic attack)   . Atypical nevi   . Diverticulosis   . Erosive esophagitis   . Colitis   . Unspecified asthma(493.90)     since childhood  . Hip fracture ([place]) 11/12    surgery  . Vaginal atrophy   . Vaginal stenosis   . Palpitations     a. 07/2015 Echo: EF 60-65%, Gr 1 DD, mild AI.  Marland Kitchen Dyspnea on exertion   . Tobacco abuse    Family History  Problem Relation Age of Onset  . Heart disease Paternal Grandmother   . Hypertension Paternal Grandmother   . Stomach cancer Maternal Grandmother   . Kidney disease Maternal Uncle   . Cancer Paternal Aunt     breast cancer   Past Surgical History    Procedure Laterality Date  . Melanoma excision  1980    Dr. Sharlet Salina  . Nephrectomy  2000    left renal cell carcinoma, Dr. Jacqlyn Larsen  . Hip fracture surgery Right 2005    hemiarthroplasty right, Dr. Francia Greaves  . Esophagogastroduodenoscopy  2002    esophagitis; grade 1, erosive  . Colonoscopy  4/10    diverticulosis; recheck 5 years  . Appendectomy    . Abdominal hysterectomy  1986    endometriosis, Dr. Pearletha Furl  . Breast lumpectomy Right 1995    right breast cancer, lumpectomy and XRT and chemo  . Hip fracture surgery Left 11/12    after fall at [place] History  . Marital Status: Widowed    Spouse Name: N/A  . Number of Children: N/A  . Years of Education: N/A   Social History Main Topics  . Smoking status: Former Smoker    Quit date: 08/02/1984  . Smokeless tobacco: None  . Alcohol Use: 0.0 oz/week    0 Standard drinks or equivalent per week     Comment: 1 beer once a month  . Drug Use: No  . Sexual Activity: Not Asked   Other Topics Concern  . None  Social History Narrative   Widowed. Lives in [place].      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  Eyes: Negative for visual disturbance.  Respiratory: Negative for shortness of breath.   Cardiovascular: Negative for chest pain and leg swelling.  Gastrointestinal: Negative for abdominal pain.  Musculoskeletal: Positive for myalgias, arthralgias and gait problem.  Skin: Positive for color change. Negative for rash.  Neurological: Positive for weakness. Negative for light-headedness.  Hematological: Negative for adenopathy. Does not bruise/bleed easily.  Psychiatric/Behavioral: Negative for dysphoric mood. The patient is not nervous/anxious.        Objective:    BP 127/70 mmHg  Pulse 73  Temp(Src) 97.7 F (36.5 C) (Oral)  Wt 136 lb 8 oz (61.916 kg)   SpO2 95% Physical Exam  Constitutional: She is oriented to person, place, and time. She appears well-developed and well-nourished. No distress.  HENT:  Head: Normocephalic and atraumatic.  Right Ear: External ear normal.  Left Ear: External ear normal.  Nose: Nose normal.  Mouth/Throat: Oropharynx is clear and moist.  Eyes: Conjunctivae are normal. Pupils are equal, round, and reactive to light. Right eye exhibits no discharge. Left eye exhibits no discharge. No scleral icterus.  Neck: Normal range of motion. Neck supple. No tracheal deviation present. No thyromegaly present.  Cardiovascular: Normal rate, regular rhythm, normal heart sounds and intact distal pulses.  Exam reveals no gallop and no friction rub.   No murmur heard. Pulmonary/Chest: Effort normal and breath sounds normal. No respiratory distress. She has no wheezes. She has no rales. She exhibits no tenderness.  Musculoskeletal: Normal range of motion. She exhibits no edema or tenderness.       Arms: Lymphadenopathy:    She has no cervical adenopathy.  Neurological: She is alert and oriented to person, place, and time. No cranial nerve deficit. She exhibits normal muscle tone. Coordination normal.  Skin: Skin is warm and dry. Ecchymosis noted. No rash noted. She is not diaphoretic. No erythema. No pallor.     Psychiatric: She has a normal mood and affect. Her behavior is normal. Judgment and thought content normal.          Assessment & Plan:   Problem List Items Addressed This Visit      Unprioritized   Fall    Recent fall after tripping on concrete. Several ecchymoses and contusions noted. Will get CXR and right hip plain xray for evaluation.      Relevant Orders   DG Chest 2 View   Right hip pain - Primary    Right hip pain after fall onto concrete. Will get plain xray to evaluate fracture.      Relevant Orders   DG HIP UNILAT W OR W/O PELVIS 2-3 VIEWS RIGHT       Return in about 1 week (around  01/04/2016) for Recheck.  Ronette Deter, MD Internal Medicine [facility]

## 2015-12-28 NOTE — Assessment & Plan Note (Signed)
Right hip pain after fall onto concrete. Will get plain xray to evaluate fracture.

## 2015-12-28 NOTE — Progress Notes (Signed)
Pre visit review using our clinic review tool, if applicable. No additional management support is needed unless otherwise documented below in the visit note. 

## 2015-12-29 LAB — HEAVY METALS PANEL, BLOOD
Lead: <1 ug/dL (ref <5)
Mercury, B: <4 mcg/L (ref <=10)

## 2015-12-29 LAB — CERULOPLASMIN: Ceruloplasmin: 23 mg/dL (ref 18–53)

## 2015-12-30 LAB — ANTIPHOSPHOLOPID AB PANEL
Anticardiolipin IgM: 35 [MPL'U] — ABNORMAL HIGH
BETA-2-GLYCOPROTEIN I IGA: 9 SAU (ref <=20)
Beta-2-Glycoprotein I IgM: <9 SMU (ref <=20)
Phosphatidylserine IgG Autoantibodies: <10 U/mL (ref <10)
Phosphatydalserine, IgM: >100 U/mL — ABNORMAL HIGH (ref <25)

## 2015-12-30 LAB — RFX DRVVT SCR W/RFLX CONF 1:1 MIX: DRVVT SCREEN: 31 s (ref <=45)

## 2015-12-30 LAB — RFX PTT-LA W/RFX TO HEX PHASE CONF: PTT-LA SCREEN: 35 s (ref <=40)

## 2015-12-30 LAB — LUPUS ANTICOAGULANT PANEL

## 2015-12-31 ENCOUNTER — Telehealth: Payer: Self-pay | Admitting: Neurology

## 2015-12-31 ENCOUNTER — Encounter: Payer: Self-pay | Admitting: Internal Medicine

## 2015-12-31 DIAGNOSIS — R791 Abnormal coagulation profile: Secondary | ICD-10-CM

## 2015-12-31 NOTE — Telephone Encounter (Signed)
-----   Message from [place], DO sent at 12/30/2015  3:13 PM EST ----- Luvenia Starch, please tell pt that while most of blood work looked fine, one that looks at clotting was little abnormal.  I don't think its anything but Dr. Gilford Rile and I think a good idea to c/w Dr. Alvy Bimler and Dr. Alvy Bimler aware

## 2015-12-31 NOTE — Telephone Encounter (Signed)
Patient made aware. She was resistant but agreed to referral. Referral sent to [facility] and they will call patient with appt.

## 2016-01-05 ENCOUNTER — Encounter: Payer: Self-pay | Admitting: Internal Medicine

## 2016-01-05 ENCOUNTER — Telehealth: Payer: Self-pay | Admitting: Neurology

## 2016-01-05 ENCOUNTER — Encounter: Payer: Self-pay | Admitting: Hematology and Oncology

## 2016-01-05 ENCOUNTER — Telehealth: Payer: Self-pay | Admitting: Hematology and Oncology

## 2016-01-05 ENCOUNTER — Ambulatory Visit
Admission: RE | Admit: 2016-01-05 | Discharge: 2016-01-05 | Disposition: A | Discharge status: Home / Self Care | Payer: PPO | Source: Ambulatory Visit | Attending: Internal Medicine | Admitting: Internal Medicine

## 2016-01-05 DIAGNOSIS — R471 Dysarthria and anarthria: Secondary | ICD-10-CM

## 2016-01-05 NOTE — Telephone Encounter (Signed)
Faxed letter to referring office in ref to np appt. Mailed np packet Intake reminder

## 2016-01-05 NOTE — Telephone Encounter (Signed)
Mychart message sent to patient making her aware of appt with hematology.

## 2016-01-06 ENCOUNTER — Ambulatory Visit: Payer: PPO | Admitting: Internal Medicine

## 2016-01-07 ENCOUNTER — Telehealth: Payer: Self-pay | Admitting: Hematology and Oncology

## 2016-01-07 NOTE — Telephone Encounter (Signed)
Pt contacted office spoke with Lexine Baton to verify np appt. I called pt to do the intake but no answer. Will try again

## 2016-01-12 ENCOUNTER — Ambulatory Visit: Payer: PPO | Admitting: Internal Medicine

## 2016-01-14 ENCOUNTER — Ambulatory Visit (INDEPENDENT_AMBULATORY_CARE_PROVIDER_SITE_OTHER): Payer: PPO | Admitting: Internal Medicine

## 2016-01-14 ENCOUNTER — Encounter: Payer: Self-pay | Admitting: Internal Medicine

## 2016-01-14 VITALS — BP 147/76 | HR 70 | Temp 98.2°F | Ht 65.0 in | Wt 136.0 lb

## 2016-01-14 DIAGNOSIS — M25551 Pain in right hip: Secondary | ICD-10-CM | POA: Diagnosis not present

## 2016-01-14 DIAGNOSIS — R471 Dysarthria and anarthria: Secondary | ICD-10-CM

## 2016-01-14 DIAGNOSIS — S7001XA Contusion of right hip, initial encounter: Secondary | ICD-10-CM | POA: Diagnosis not present

## 2016-01-14 DIAGNOSIS — T148XXA Other injury of unspecified body region, initial encounter: Secondary | ICD-10-CM | POA: Insufficient documentation

## 2016-01-14 LAB — CBC WITH DIFFERENTIAL/PLATELET
BASOS ABS: 0.1 10*3/uL (ref 0.0–0.1)
Basophils Relative: 1.4 % (ref 0.0–3.0)
EOS ABS: 0.1 10*3/uL (ref 0.0–0.7)
Eosinophils Relative: 0.9 % (ref 0.0–5.0)
HCT: 39.6 % (ref 36.0–46.0)
Hemoglobin: 13.1 g/dL (ref 12.0–15.0)
LYMPHS ABS: 1.5 10*3/uL (ref 0.7–4.0)
Lymphocytes Relative: 19.9 % (ref 12.0–46.0)
MCHC: 33 g/dL (ref 30.0–36.0)
MCV: 95.6 fl (ref 78.0–100.0)
MONO ABS: 0.6 10*3/uL (ref 0.1–1.0)
Monocytes Relative: 7.7 % (ref 3.0–12.0)
NEUTROS ABS: 5.4 10*3/uL (ref 1.4–7.7)
NEUTROS PCT: 70.1 % (ref 43.0–77.0)
PLATELETS: 258 10*3/uL (ref 150.0–400.0)
RBC: 4.14 Mil/uL (ref 3.87–5.11)
RDW: 18.3 % — ABNORMAL HIGH (ref 11.5–15.5)
WBC: 7.7 10*3/uL (ref 4.0–10.5)

## 2016-01-14 LAB — PROTIME-INR
INR: 1.1 ratio — AB (ref 0.8–1.0)
PROTHROMBIN TIME: 11.7 s (ref 9.6–13.1)

## 2016-01-14 LAB — APTT: APTT: 27.5 s (ref 23.4–32.7)

## 2016-01-14 NOTE — Assessment & Plan Note (Signed)
Persistent dysarthria. She was unable to tolerate MRI brain (open) because of anxiety. Will try to set up MRI brain under anesthesia.

## 2016-01-14 NOTE — Progress Notes (Signed)
Subjective:    Patient ID: Mary Fields, female    DOB: 1941/06/20, 75 y.o.   MRN: [id number]  HPI  75YO female presents for follow up.  Evaluation in process for dysarthria. Seen by neurology. Evaluation including labs showed possible abnormal coagulation. Referral was placed to hematologist. MRI was ordered but not performed because of anxiety about exam.  She reports a long history of trouble clotting. Had issues with prolonged bleeding after tooth extraction.  Had "horrible" experience with MRI brain. She reports that she was not informed about what would happen. This was an open MRI. She reports things were "jammed"over her face.  Continues to have trouble with word finding and slurred speech..  Wt Readings from Last 3 Encounters:  01/14/16 136 lb (61.689 kg)  12/28/15 136 lb 8 oz (61.916 kg)  12/27/15 137 lb (62.143 kg)   BP Readings from Last 3 Encounters:  01/14/16 147/76  12/28/15 127/70  12/27/15 144/82    Past Medical History  Diagnosis Date  . Other malaise and fatigue   . Benign neoplasm of skin, site unspecified     Dr. Aubery Lapping  . 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 Dizziness and giddiness   . Palpitations   . Herpes simplex without mention of complication   . Ulcerative colitis, unspecified   . Malignant neoplasm of kidney, except pelvis   . Disorder of bone and cartilage, unspecified   . Esophageal reflux   . Personal history of other malignant neoplasm of skin   . Personal history of malignant neoplasm of breast   . Hx-TIA (transient ischemic attack)   . Atypical nevi   . Diverticulosis   . Erosive esophagitis   . Colitis   . Unspecified asthma(493.90)     since childhood  . Hip fracture ([place]) 11/12    surgery  . Vaginal atrophy   . Vaginal stenosis   . Palpitations     a. 07/2015 Echo: EF 60-65%, Gr 1 DD, mild AI.  Marland Kitchen Dyspnea on exertion   . Tobacco abuse    Family History  Problem Relation Age of  Onset  . Heart disease Paternal Grandmother   . Hypertension Paternal Grandmother   . Stomach cancer Maternal Grandmother   . Kidney disease Maternal Uncle   . Cancer Paternal Aunt     breast cancer   Past Surgical History  Procedure Laterality Date  . Melanoma excision  1980    Dr. Sharlet Salina  . Nephrectomy  2000    left renal cell carcinoma, Dr. Jacqlyn Larsen  . Hip fracture surgery Right 2005    hemiarthroplasty right, Dr. Francia Greaves  . Esophagogastroduodenoscopy  2002    esophagitis; grade 1, erosive  . Colonoscopy  4/10    diverticulosis; recheck 5 years  . Appendectomy    . Abdominal hysterectomy  1986    endometriosis, Dr. Pearletha Furl  . Breast lumpectomy Right 1995    right breast cancer, lumpectomy and XRT and chemo  . Hip fracture surgery Left 11/12    after fall at [place] History  . Marital Status: Widowed    Spouse Name: N/A  . Number of Children: N/A  . Years of Education: N/A   Social History Main Topics  . Smoking status: Former Smoker    Quit date: 08/02/1984  . Smokeless tobacco: None  . Alcohol Use: 0.0 oz/week    0 Standard drinks or equivalent per week  Comment: 1 beer once a month  . Drug Use: No  . Sexual Activity: Not Asked   Other Topics Concern  . None   Social History Narrative   Widowed. Lives in [place].      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  Eyes: Negative for visual disturbance.  Respiratory: Negative for shortness of breath.   Cardiovascular: Negative for chest pain and leg swelling.  Gastrointestinal: Negative for abdominal pain.  Musculoskeletal: Positive for myalgias and arthralgias.  Skin: Positive for color change. Negative for rash.  Neurological: Positive for speech difficulty.  Hematological: Negative for adenopathy. Does not bruise/bleed easily.    Psychiatric/Behavioral: Positive for sleep disturbance and dysphoric mood. Negative for suicidal ideas. The patient is nervous/anxious.        Objective:    BP 147/76 mmHg  Pulse 70  Temp(Src) 98.2 F (36.8 C) (Oral)  Ht 5\' 5"  (1.651 m)  Wt 136 lb (61.689 kg)  BMI 22.63 kg/m2  SpO2 95% Physical Exam  Constitutional: She is oriented to person, place, and time. She appears well-developed and well-nourished. No distress.  HENT:  Head: Normocephalic and atraumatic.  Right Ear: External ear normal.  Left Ear: External ear normal.  Nose: Nose normal.  Mouth/Throat: Oropharynx is clear and moist. No oropharyngeal exudate.  Eyes: Conjunctivae are normal. Pupils are equal, round, and reactive to light. Right eye exhibits no discharge. Left eye exhibits no discharge. No scleral icterus.  Neck: Normal range of motion. Neck supple. No tracheal deviation present. No thyromegaly present.  Cardiovascular: Normal rate, regular rhythm, normal heart sounds and intact distal pulses.  Exam reveals no gallop and no friction rub.   No murmur heard. Pulmonary/Chest: Effort normal and breath sounds normal. No respiratory distress. She has no wheezes. She has no rales. She exhibits no tenderness.  Musculoskeletal: Normal range of motion. She exhibits no edema or tenderness.       Right hip: She exhibits normal range of motion.       Legs: Lymphadenopathy:    She has no cervical adenopathy.  Neurological: She is alert and oriented to person, place, and time. No cranial nerve deficit. She exhibits normal muscle tone. Coordination normal.  Skin: Skin is warm and dry. No rash noted. She is not diaphoretic. No erythema. No pallor.  Psychiatric: Judgment and thought content normal. Her mood appears anxious. Her affect is labile. Her speech is delayed and slurred. She is agitated. Cognition and memory are normal.  She is intermittently crying, upset, yelling, wailing. Seems out of proportion to concerns voiced  about MRI.          Assessment & Plan:   Problem List Items Addressed This Visit      Unprioritized   Dysarthria - Primary    Persistent dysarthria. She was unable to tolerate MRI brain (open) because of anxiety. Will try to set up MRI brain under anesthesia.      Relevant Orders   Ambulatory referral to Anesthesiology   MR Brain W Wo Contrast   Hematoma    Will get [place] evaluation of hematoma to see if any areas amenable to drainage.      Relevant Orders   CBC w/Diff   INR/PT   PTT   [place] Misc Soft Tissue   Right hip pain    Right hip pain at site of  hematoma. Plain film was normal. Will get [place] evaluation of hematoma.          Return in about 4 weeks (around 02/11/2016) for Recheck.  Ronette Deter, MD Internal Medicine [facility]

## 2016-01-14 NOTE — Patient Instructions (Addendum)
Labs today.  Follow up in 4 weeks. 

## 2016-01-14 NOTE — Assessment & Plan Note (Signed)
Right hip pain at site of hematoma. Plain film was normal. Will get [place] evaluation of hematoma.

## 2016-01-14 NOTE — Assessment & Plan Note (Signed)
Will get [place] evaluation of hematoma to see if any areas amenable to drainage.

## 2016-01-14 NOTE — Progress Notes (Signed)
Pre visit review using our clinic review tool, if applicable. No additional management support is needed unless otherwise documented below in the visit note. 

## 2016-01-17 ENCOUNTER — Ambulatory Visit
Admission: RE | Admit: 2016-01-17 | Discharge: 2016-01-17 | Disposition: A | Discharge status: Home / Self Care | Payer: PPO | Source: Ambulatory Visit | Attending: Internal Medicine | Admitting: Internal Medicine

## 2016-01-17 DIAGNOSIS — X58XXXA Exposure to other specified factors, initial encounter: Secondary | ICD-10-CM | POA: Diagnosis not present

## 2016-01-17 DIAGNOSIS — T148 Other injury of unspecified body region: Secondary | ICD-10-CM | POA: Insufficient documentation

## 2016-01-17 DIAGNOSIS — S7001XA Contusion of right hip, initial encounter: Secondary | ICD-10-CM | POA: Insufficient documentation

## 2016-01-17 DIAGNOSIS — T148XXA Other injury of unspecified body region, initial encounter: Secondary | ICD-10-CM

## 2016-01-18 ENCOUNTER — Ambulatory Visit: Payer: PPO

## 2016-01-19 ENCOUNTER — Encounter: Payer: Self-pay | Admitting: Internal Medicine

## 2016-01-20 ENCOUNTER — Encounter: Payer: Self-pay | Admitting: Hematology and Oncology

## 2016-01-20 ENCOUNTER — Ambulatory Visit (HOSPITAL_BASED_OUTPATIENT_CLINIC_OR_DEPARTMENT_OTHER): Payer: PPO | Admitting: Hematology and Oncology

## 2016-01-20 VITALS — BP 160/91 | HR 72 | Temp 97.8°F | Resp 18 | Ht 65.0 in | Wt 134.7 lb

## 2016-01-20 DIAGNOSIS — Z85528 Personal history of other malignant neoplasm of kidney: Secondary | ICD-10-CM | POA: Diagnosis not present

## 2016-01-20 DIAGNOSIS — Z8679 Personal history of other diseases of the circulatory system: Secondary | ICD-10-CM | POA: Diagnosis not present

## 2016-01-20 DIAGNOSIS — Z853 Personal history of malignant neoplasm of breast: Secondary | ICD-10-CM

## 2016-01-20 DIAGNOSIS — Z85820 Personal history of malignant melanoma of skin: Secondary | ICD-10-CM

## 2016-01-20 DIAGNOSIS — R791 Abnormal coagulation profile: Secondary | ICD-10-CM | POA: Diagnosis not present

## 2016-01-20 DIAGNOSIS — T148XXA Other injury of unspecified body region, initial encounter: Secondary | ICD-10-CM

## 2016-01-20 NOTE — Assessment & Plan Note (Signed)
I recommend continue close follow-up with her dermatologist. The patient is vigilant about skin protection

## 2016-01-20 NOTE — Assessment & Plan Note (Signed)
I have a long discussion with the patient and explained to her the interpretation of recent abnormal coagulation profile. There were mild abnormalities with elevated levels of anti-cardiolipin antibody and Phosphatydalserine antibody. These tests are not specific for diagnosis of antiphospholipid antibiotics syndrome. It is unclear to me in that her recent episode is due to transient ischemic attacks or not. The elevated ANA result with presence of arthritis symptoms might point towards nonspecific autoimmune disorder that could sometimes be associated with abnormal thrombophilia panel. Overall, I do not believe she fulfilled the criteria for antiphospholipid antibody syndrome and hence I would not recommend specific changes to her treatment right now. However, if MRI showed evidence of ischemic changes in the blood vessels in the brain, and since this is happening in the setting of aspirin therapy, one could consider either increasing the dosage of aspirin to 325 mg or to consider changing her antiplatelet agent to Plavix. I would defer to her primary care doctor for further management after MRI of result is available.

## 2016-01-20 NOTE — Assessment & Plan Note (Signed)
She had history of transient ischemic attacks and has been on aspirin for some time. It is not clear to me whether her recent episode of choking and difficulties with word finding could be due to another episode of transient ischemic attack or not. MRI is pending. Again, as above, if the MRI suggested she may have cardiovascular disorder, I would defer to her primary care doctor to decide consideration to increase the dose of aspirin or to switch her to other antiplatelet agents.

## 2016-01-20 NOTE — Progress Notes (Signed)
[facility] NOTE  Patient Care Team: Jackolyn Confer, MD as PCP - General (Internal Medicine) Minna Merritts, MD as Consulting Physician (Cardiology)  CHIEF COMPLAINTS/PURPOSE OF CONSULTATION:  Abnormal hypercoagulable panel, recent hematoma, concern for possible coagulation disorder  HISTORY OF PRESENTING ILLNESS:  Mary Fields 75 y.o. female is here because of concern for coagulation disorder.  She is a pleasant 75 year old lady with remote history of left kidney cancer status post resection without the need for adjuvant treatment, history of melanoma on her back status post excision without the need for adjuvant treatment, and remote history of right breast cancer status post lumpectomy, radiation therapy, chemotherapy and tamoxifen for 10 years.  This patient has prior history of transient ischemic attack with amaurosis fugax and has been taking chronic aspirin therapy at a dose of 81 mg daily.  Recently, she complained of episodes of word finding difficulties and difficulties with choking sensation and drooling. She was referred to see a neurologist and had multiple blood tests done.  Hypercoagulation panel was drawn on 12/27/2015 that did not confirm lupus anticoagulant. However, Phosphatydalserine IgM and Anticardiolipin IgM were both elevated. Of note, serum ANA level was elevated as well. There is a concern whether the reason for her word finding difficulties and difficulties with choking sensation could be due to another episode of TIA. Since then, she has resolution of her symptoms.  Also, right around 12/26/2015, she had a fall and had significant bruises including a large bruise over the right hip. Subsequently, she reported to her primary care physician that she has "long history of troubles with clotting". She recalled episode of prolonged bleeding after tooth extraction as a child. As I went through her multiple surgical histories, she denies  significant postoperative bleeding or the need for blood transfusions. She numerous surgical challenges including hysterectomy, breast cancer surgery, left nephrectomy, bilateral hip fracture surgeries, and melanoma resection on her back without excessive bleeding.  The patient denies any recent signs or symptoms of bleeding such as spontaneous epistaxis, hematuria or hematochezia. From the blood clotting standpoint, she denies prior history of DVTs, PEs, or heart attacks  MEDICAL HISTORY:  Past Medical History  Diagnosis Date  . Other malaise and fatigue   . Benign neoplasm of skin, site unspecified     Dr. Aubery Lapping  . 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 Dizziness and giddiness   . Palpitations   . Herpes simplex without mention of complication   . Ulcerative colitis, unspecified   . Malignant neoplasm of kidney, except pelvis   . Disorder of bone and cartilage, unspecified   . Esophageal reflux   . Personal history of other malignant neoplasm of skin   . Personal history of malignant neoplasm of breast   . Hx-TIA (transient ischemic attack)   . Atypical nevi   . Diverticulosis   . Erosive esophagitis   . Colitis   . Unspecified asthma(493.90)     since childhood  . Hip fracture ([place]) 11/12    surgery  . Vaginal atrophy   . Vaginal stenosis   . Palpitations     a. 07/2015 Echo: EF 60-65%, Gr 1 DD, mild AI.  Marland Kitchen Dyspnea on exertion   . Tobacco abuse     SURGICAL HISTORY: Past Surgical History  Procedure Laterality Date  . Melanoma excision  1980    Dr. Sharlet Salina  . Nephrectomy  2000    left renal cell carcinoma, Dr.  Cope  . Hip fracture surgery Right 2005    hemiarthroplasty right, Dr. Francia Greaves  . Esophagogastroduodenoscopy  2002    esophagitis; grade 1, erosive  . Colonoscopy  4/10    diverticulosis; recheck 5 years  . Appendectomy    . Abdominal hysterectomy  1986    endometriosis, Dr. Pearletha Furl  . Breast lumpectomy Right 1995     right breast cancer, lumpectomy and XRT and chemo  . Hip fracture surgery Left 11/12    [facility] at [facility]    SOCIAL HISTORY: Social History   Social History  . Marital Status: Widowed    Spouse Name: N/A  . Number of Children: N/A  . Years of Education: N/A   Occupational History  . Not on file.   Social History Main Topics  . Smoking status: Former Smoker    Quit date: 08/02/1984  . Smokeless tobacco: Never Used  . Alcohol Use: 0.0 oz/week    0 Standard drinks or equivalent per week     Comment: 1 beer once a month  . Drug Use: No  . Sexual Activity: Not on file   Other Topics Concern  . Not on file   Social History Narrative   Widowed. Lives in [place].      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 Heart disease Paternal Grandmother   . Hypertension Paternal Grandmother   . Stomach cancer Maternal Grandmother   . Kidney disease Maternal Uncle   . Cancer Paternal Aunt     breast cancer    ALLERGIES:  is allergic to ciprofloxacin; meperidine; prednisone; and sulfonamide derivatives.  MEDICATIONS:  Current Outpatient Prescriptions  Medication Sig Dispense Refill  . acyclovir (ZOVIRAX) 400 MG tablet Take 1 tablet (400 mg total) by mouth 4 (four) times daily. 360 tablet 1  . aspirin 81 MG tablet Take 81 mg by mouth every other day.     . Estrogens, Conjugated (PREMARIN VA) Place 1 g vaginally once a week.    . Fluticasone Furoate-Vilanterol (BREO ELLIPTA) 100-25 MCG/INH AEPB Inhale 1 puff into the lungs daily. 1 each 11  . Multiple Vitamin (MULTIVITAMIN) tablet Take 1 tablet by mouth daily.      . NON FORMULARY Ease Takes 1 tablet by mouth daily.    . pantoprazole (PROTONIX) 40 MG tablet Take 1 tablet (40 mg total) by mouth daily. 30 tablet 3  . Propylene Glycol (SYSTANE BALANCE OP) 1 drop daily.     . ranitidine (ZANTAC) 300 MG tablet Take 1 tablet (300 mg total) by mouth at  bedtime. 30 tablet 6  . vitamin C (ASCORBIC ACID) 500 MG tablet Take 1 tablet (500 mg total) by mouth daily. 30 tablet 0   No current facility-administered medications for this visit.    REVIEW OF SYSTEMS:   Constitutional: Denies fevers, chills or abnormal night sweats Eyes: Denies blurriness of vision, double vision or watery eyes Ears, nose, mouth, throat, and face: Denies mucositis or sore throat Respiratory: Denies cough, dyspnea or wheezes Cardiovascular: Denies palpitation, chest discomfort or lower extremity swelling Gastrointestinal:  Denies nausea, heartburn or change in bowel habits Skin: Denies abnormal skin rashes Lymphatics: Denies new lymphadenopathy or easy bruising Neurological:Denies numbness, tingling or new weaknesses Behavioral/Psych: Mood is stable, no new changes  All other systems were reviewed with the patient and are negative.  PHYSICAL EXAMINATION: ECOG PERFORMANCE STATUS: 1 -  Symptomatic but completely ambulatory  Filed Vitals:   01/20/16 1131  BP: 160/91  Pulse: 72  Temp: 97.8 F (36.6 C)  Resp: 18   Filed Weights   01/20/16 1131  Weight: 134 lb 11.2 oz (61.1 kg)    GENERAL:alert, no distress and comfortable SKIN: skin color, texture, turgor are normal, no rashes or significant lesions EYES: normal, conjunctiva are pink and non-injected, sclera clear OROPHARYNX:no exudate, no erythema and lips, buccal mucosa, and tongue normal  NECK: supple, thyroid normal size, non-tender, without nodularity LYMPH:  no palpable lymphadenopathy in the cervical, axillary or inguinal LUNGS: clear to auscultation and percussion with normal breathing effort HEART: regular rate & rhythm and no murmurs and no lower extremity edema ABDOMEN:abdomen soft, non-tender and normal bowel sounds Musculoskeletal:no cyanosis of digits and no clubbing  PSYCH: alert & oriented x 3 with fluent speech. She appears very anxious and then tearful at the end of today's visit NEURO:  no focal motor/sensory deficits  LABORATORY DATA: I review her recent blood tests with the patient I have reviewed the data as listed Lab Results  Component Value Date   WBC 7.7 01/14/2016   HGB 13.1 01/14/2016   HCT 39.6 01/14/2016   MCV 95.6 01/14/2016   PLT 258.0 01/14/2016    Recent Labs  09/02/15 1115 12/16/15 1058 12/27/15 1118  NA 137 138  --   K 4.3 4.2  --   CL 107 104  --   CO2 25 28  --   GLUCOSE 86 87  --   BUN 24* 20  --   CREATININE 0.83 0.94  --   CALCIUM 9.2 9.1 9.0  GFRNONAA >60  --   --   GFRAA >60  --   --   PROT  --  7.0  --   ALBUMIN  --  4.3  --   AST  --  16  --   ALT  --  11  --   ALKPHOS  --  48  --   BILITOT  --  0.4  --     RADIOGRAPHIC STUDIES: I have personally reviewed the radiological images as listed and agreed with the findings in the report. Dg Chest 2 View  12/28/2015  CLINICAL DATA:  Right-sided rib pain after fall 2 days ago. EXAM: CHEST  2 VIEW COMPARISON:  September 10, 2015 FINDINGS: The heart size and mediastinal contours are within normal limits. Both lungs are clear. No pneumothorax or pleural effusion is noted. Right axillary surgical clips are noted. The visualized skeletal structures are unremarkable. IMPRESSION: No active cardiopulmonary disease. Electronically Signed   By: Marijo Conception, M.D.   On: 12/28/2015 15:33   [place] Extrem Low Right Ltd  01/17/2016  CLINICAL DATA:  Hematoma. EXAM: ULTRASOUND RIGHT LOWER EXTREMITY LIMITED TECHNIQUE: Ultrasound examination of the lower extremity soft tissues was performed in the area of clinical concern. COMPARISON:  Hip series 3 04/2016. FINDINGS: A 8.1 x 2.5 x 4.3 cm complex fluid collection is noted in the region of the right hip. This is consistent the patient's known history of hematoma. Other etiologies of cystic fluid collections can not be excluded. No significant color flow was noted in the fluid collection.No definite evidence of hernia noted. IMPRESSION: 8.1 x 2.5 x 4.3 cm complex  cystic fluid collection in the region of the right hip consistent with the patient's history of hematoma. Electronically Signed   By: Lewis   On: 01/17/2016 16:34   Dg Hip  Unilat W Or W/o Pelvis 2-3 Views Right  12/28/2015  CLINICAL DATA:  Status post fall 2 days ago with right hip pain and bruising. EXAM: DG HIP (WITH OR WITHOUT PELVIS) 2-3V RIGHT COMPARISON:  None. FINDINGS: There is no evidence of hip fracture or dislocation. Bilateral hip replacements are identified without malalignment. Degenerative joint changes of the spine are noted. IMPRESSION: No acute abnormality identified. Electronically Signed   By: Abelardo Diesel M.D.   On: 12/28/2015 15:09    ASSESSMENT & PLAN:  Coagulation test abnormality I have a long discussion with the patient and explained to her the interpretation of recent abnormal coagulation profile. There were mild abnormalities with elevated levels of anti-cardiolipin antibody and Phosphatydalserine antibody. These tests are not specific for diagnosis of antiphospholipid antibiotics syndrome. It is unclear to me in that her recent episode is due to transient ischemic attacks or not. The elevated ANA result with presence of arthritis symptoms might point towards nonspecific autoimmune disorder that could sometimes be associated with abnormal thrombophilia panel. Overall, I do not believe she fulfilled the criteria for antiphospholipid antibody syndrome and hence I would not recommend specific changes to her treatment right now. However, if MRI showed evidence of ischemic changes in the blood vessels in the brain, and since this is happening in the setting of aspirin therapy, one could consider either increasing the dosage of aspirin to 325 mg or to consider changing her antiplatelet agent to Plavix. I would defer to her primary care doctor for further management after MRI of result is available.  Hematoma She had recent hematoma as a result of fall. She was  taking aspirin at that time. She recalled excessive bleeding after dental extraction in the past which is not unusual. However, she had numerous surgical challenges in the past without excessive history of bleeding or the need for blood transfusion. Overall, I reassured the patient that based on her history alone, I do not believe she has evidence to suggest a bleeding disorder. The abnormal hypercoagulable panel is usually associated with increased risks of blood clotting and not tendency for bleeding. She does not need further workup  History of cardiovascular disorder She had history of transient ischemic attacks and has been on aspirin for some time. It is not clear to me whether her recent episode of choking and difficulties with word finding could be due to another episode of transient ischemic attack or not. MRI is pending. Again, as above, if the MRI suggested she may have cardiovascular disorder, I would defer to her primary care doctor to decide consideration to increase the dose of aspirin or to switch her to other antiplatelet agents.  History of melanoma I recommend continue close follow-up with her dermatologist. The patient is vigilant about skin protection     All questions were answered. The patient knows to call the clinic with any problems, questions or concerns. I spent 40 minutes counseling the patient face to face. The total time spent in the appointment was 55 minutes and more than 50% was on counseling.     [place], Perry, MD 01/20/2016 1:45 PM

## 2016-01-20 NOTE — Assessment & Plan Note (Signed)
She had recent hematoma as a result of fall. She was taking aspirin at that time. She recalled excessive bleeding after dental extraction in the past which is not unusual. However, she had numerous surgical challenges in the past without excessive history of bleeding or the need for blood transfusion. Overall, I reassured the patient that based on her history alone, I do not believe she has evidence to suggest a bleeding disorder. The abnormal hypercoagulable panel is usually associated with increased risks of blood clotting and not tendency for bleeding. She does not need further workup

## 2016-01-21 ENCOUNTER — Encounter (HOSPITAL_COMMUNITY): Payer: Self-pay | Admitting: Anesthesiology

## 2016-01-25 ENCOUNTER — Ambulatory Visit (HOSPITAL_COMMUNITY): Admission: RE | Admit: 2016-01-25 | Payer: PPO | Source: Ambulatory Visit | Admitting: Internal Medicine

## 2016-01-25 ENCOUNTER — Ambulatory Visit (HOSPITAL_COMMUNITY): Payer: PPO

## 2016-01-25 ENCOUNTER — Encounter (HOSPITAL_COMMUNITY): Admission: RE | Payer: Self-pay | Source: Ambulatory Visit

## 2016-01-25 SURGERY — RADIOLOGY WITH ANESTHESIA
Anesthesia: General

## 2016-01-26 ENCOUNTER — Ambulatory Visit (INDEPENDENT_AMBULATORY_CARE_PROVIDER_SITE_OTHER): Payer: PPO | Admitting: *Deleted

## 2016-01-26 DIAGNOSIS — E538 Deficiency of other specified B group vitamins: Secondary | ICD-10-CM | POA: Diagnosis not present

## 2016-01-26 MED ORDER — CYANOCOBALAMIN 1000 MCG/ML IJ SOLN
1000.0000 ug | Freq: Once | INTRAMUSCULAR | Status: AC
  Administered 2016-01-26: 1000 ug via INTRAMUSCULAR

## 2016-01-31 ENCOUNTER — Encounter: Payer: Self-pay | Admitting: Internal Medicine

## 2016-02-03 ENCOUNTER — Ambulatory Visit (HOSPITAL_COMMUNITY): Payer: PPO

## 2016-02-03 DIAGNOSIS — H2513 Age-related nuclear cataract, bilateral: Secondary | ICD-10-CM | POA: Diagnosis not present

## 2016-02-11 ENCOUNTER — Ambulatory Visit (INDEPENDENT_AMBULATORY_CARE_PROVIDER_SITE_OTHER): Payer: PPO | Admitting: Pulmonary Disease

## 2016-02-11 ENCOUNTER — Encounter: Payer: Self-pay | Admitting: Pulmonary Disease

## 2016-02-11 VITALS — BP 130/78 | HR 81 | Ht 66.0 in | Wt 134.0 lb

## 2016-02-11 DIAGNOSIS — J4530 Mild persistent asthma, uncomplicated: Secondary | ICD-10-CM

## 2016-02-11 DIAGNOSIS — R05 Cough: Secondary | ICD-10-CM | POA: Diagnosis not present

## 2016-02-11 DIAGNOSIS — R490 Dysphonia: Secondary | ICD-10-CM

## 2016-02-11 DIAGNOSIS — R059 Cough, unspecified: Secondary | ICD-10-CM

## 2016-02-12 ENCOUNTER — Other Ambulatory Visit: Payer: Self-pay | Admitting: Internal Medicine

## 2016-02-13 NOTE — Progress Notes (Signed)
PROBLEMS: Mild persistent asthma  PFTs 10/28/15: No obstruction, mild restriction, Very mild reduction in DLCO that corrects to normal for [place]  INTERVAL HISTORY: No major events  SUBJ: She feels that she has benefited from addition of Breo started last visit. Respiratory status is stable. Reports persistent cough which is nonproductive and hoarseness. Denies CP, fever, purulent sputum, hemoptysis, LE edema and calf tenderness  OBJ: Filed Vitals:   02/11/16 0916  BP: 130/78  Pulse: 81  Height: 5\' 6"  (1.676 m)  Weight: 134 lb (60.782 kg)  SpO2: 97%    Gen: occasional dry cough, NAD, voice normal HEENT: All WNL Neck: NO LAN, no JVD noted Lungs: full BS, normal percussion note, no wheezes Cardiovascular: Reg rate, normal rhythm, no M noted Abdomen: Soft, NT +BS Ext: no C/C/E Neuro: No focal deficitst   DATA: CXR 12/28/15: NCPD  IMPRESSION: Asthma, mild persistent, uncomplicated Cough - mild Hoarseness - minimal  PLAN: Cont Breo and PRN albuterol ROV 3 months   Merton Border, MD PCCM service Mobile [phone] Pager [phone] 02/13/2016

## 2016-02-16 ENCOUNTER — Ambulatory Visit
Admission: RE | Admit: 2016-02-16 | Discharge: 2016-02-16 | Disposition: A | Discharge status: Home / Self Care | Payer: PPO | Source: Ambulatory Visit | Attending: Internal Medicine | Admitting: Internal Medicine

## 2016-02-16 ENCOUNTER — Ambulatory Visit: Payer: PPO

## 2016-02-16 ENCOUNTER — Encounter: Payer: Self-pay | Admitting: Internal Medicine

## 2016-02-16 DIAGNOSIS — R471 Dysarthria and anarthria: Secondary | ICD-10-CM

## 2016-02-16 DIAGNOSIS — G319 Degenerative disease of nervous system, unspecified: Secondary | ICD-10-CM | POA: Insufficient documentation

## 2016-02-16 DIAGNOSIS — S0990XA Unspecified injury of head, initial encounter: Secondary | ICD-10-CM | POA: Diagnosis not present

## 2016-02-16 DIAGNOSIS — I6782 Cerebral ischemia: Secondary | ICD-10-CM | POA: Insufficient documentation

## 2016-02-16 MED ORDER — GADOBENATE DIMEGLUMINE 529 MG/ML IV SOLN
10.0000 mL | Freq: Once | INTRAVENOUS | Status: AC | PRN
  Administered 2016-02-16: 10 mL via INTRAVENOUS

## 2016-02-22 ENCOUNTER — Encounter: Payer: Self-pay | Admitting: Internal Medicine

## 2016-02-23 ENCOUNTER — Ambulatory Visit: Payer: PPO | Admitting: Internal Medicine

## 2016-02-24 ENCOUNTER — Ambulatory Visit: Payer: PPO | Admitting: Internal Medicine

## 2016-02-24 ENCOUNTER — Telehealth: Payer: Self-pay | Admitting: Internal Medicine

## 2016-02-24 NOTE — Telephone Encounter (Signed)
Thanks

## 2016-02-24 NOTE — Telephone Encounter (Signed)
OK. Your welcome!

## 2016-02-24 NOTE — Telephone Encounter (Signed)
I resch pt for 05/17 @4 :30 it was the only day close. I tried to offer pt 05/30 a 12min appt she did not want to wait that far to get MRI results read. Thank you!

## 2016-03-08 ENCOUNTER — Ambulatory Visit: Payer: PPO | Admitting: Internal Medicine

## 2016-03-08 ENCOUNTER — Ambulatory Visit (INDEPENDENT_AMBULATORY_CARE_PROVIDER_SITE_OTHER): Payer: PPO | Admitting: Internal Medicine

## 2016-03-08 ENCOUNTER — Encounter: Payer: Self-pay | Admitting: Internal Medicine

## 2016-03-08 VITALS — BP 140/76 | HR 77 | Ht 66.0 in | Wt 133.8 lb

## 2016-03-08 DIAGNOSIS — R471 Dysarthria and anarthria: Secondary | ICD-10-CM | POA: Diagnosis not present

## 2016-03-08 DIAGNOSIS — R0609 Other forms of dyspnea: Secondary | ICD-10-CM

## 2016-03-08 DIAGNOSIS — R413 Other amnesia: Secondary | ICD-10-CM

## 2016-03-08 DIAGNOSIS — R4701 Aphasia: Secondary | ICD-10-CM | POA: Diagnosis not present

## 2016-03-08 DIAGNOSIS — R252 Cramp and spasm: Secondary | ICD-10-CM | POA: Diagnosis not present

## 2016-03-08 NOTE — Assessment & Plan Note (Signed)
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 Question if EMG testing might be helpful. Recent MRI brain showed no acute findings. Will set up follow up with neurology.

## 2016-03-08 NOTE — Patient Instructions (Signed)
We will set up follow up with Dr. Carles Collet.  Follow up here in 3 months.

## 2016-03-08 NOTE — Progress Notes (Signed)
Subjective:    Patient ID: Mary Fields, female    DOB: 19-Aug-1941, 75 y.o.   MRN: [id number]  HPI  75YO female presents for follow up.  Recent MRI showed generalized atrophy, but no acute process.  Moved last weekend to [place]. Sold her home.  Feeling extremely tired. Continues to have slow speech. Has trouble with word finding. Feels generally weak at times. No falls. Worsening short term memory.  Breathing has been unchanged. No improvement with Breo, however she notes that symptoms worsened when off Breo for 1 week.  Having some trouble swallowing. Feels that solid foods get stuck in lower throat. Sometimes chokes on saliva. Also having painful muscle spasms in arms and legs. Not taking anything for this. No clear triggers.  Wt Readings from Last 3 Encounters:  03/08/16 133 lb 12.8 oz (60.691 kg)  02/11/16 134 lb (60.782 kg)  01/20/16 134 lb 11.2 oz (61.1 kg)   BP Readings from Last 3 Encounters:  03/08/16 140/76  02/11/16 130/78  01/20/16 160/91    Past Medical History  Diagnosis Date  . Other malaise and fatigue   . Benign neoplasm of skin, site unspecified     Dr. Aubery Lapping  . 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 Dizziness and giddiness   . Palpitations   . Herpes simplex without mention of complication   . Ulcerative colitis, unspecified   . Malignant neoplasm of kidney, except pelvis   . Disorder of bone and cartilage, unspecified   . Esophageal reflux   . Personal history of other malignant neoplasm of skin   . Personal history of malignant neoplasm of breast   . Hx-TIA (transient ischemic attack)   . Atypical nevi   . Diverticulosis   . Erosive esophagitis   . Colitis   . Unspecified asthma(493.90)     since childhood  . Hip fracture ([place]) 11/12    surgery  . Vaginal atrophy   . Vaginal stenosis   . Palpitations     a. 07/2015 Echo: EF 60-65%, Gr 1 DD, mild AI.  Marland Kitchen Dyspnea on exertion   .  Tobacco abuse    Family History  Problem Relation Age of Onset  . Heart disease Paternal Grandmother   . Hypertension Paternal Grandmother   . Stomach cancer Maternal Grandmother   . Kidney disease Maternal Uncle   . Cancer Paternal Aunt     breast cancer   Past Surgical History  Procedure Laterality Date  . Melanoma excision  1980    Dr. Sharlet Salina  . Nephrectomy  2000    left renal cell carcinoma, Dr. Jacqlyn Larsen  . Hip fracture surgery Right 2005    hemiarthroplasty right, Dr. Francia Greaves  . Esophagogastroduodenoscopy  2002    esophagitis; grade 1, erosive  . Colonoscopy  4/10    diverticulosis; recheck 5 years  . Appendectomy    . Abdominal hysterectomy  1986    endometriosis, Dr. Pearletha Furl  . Breast lumpectomy Right 1995    right breast cancer, lumpectomy and XRT and chemo  . Hip fracture surgery Left 11/12    after fall at [facility] History  . Marital Status: Widowed    Spouse Name: N/A  . Number of Children: N/A  . Years of Education: N/A   Social History Main Topics  . Smoking status: Former Smoker    Quit date: 08/02/1984  . Smokeless tobacco: Never Used  .  Alcohol Use: 0.0 oz/week    0 Standard drinks or equivalent per week     Comment: 1 beer once a month  . Drug Use: No  . Sexual Activity: Not Asked   Other Topics Concern  . None   Social History Narrative   Widowed. Lives in [place].      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 Negative for fever, chills, appetite change and unexpected weight change.  HENT: Positive for trouble swallowing.   Eyes: Negative for visual disturbance.  Respiratory: Negative for shortness of breath.   Cardiovascular: Negative for chest pain and leg swelling.  Gastrointestinal: Negative for nausea, vomiting, abdominal pain, diarrhea and constipation.  Musculoskeletal: Positive for myalgias and arthralgias.  Skin: Negative for color  change and rash.  Neurological: Positive for tremors, speech difficulty and weakness.  Hematological: Negative for adenopathy. Does not bruise/bleed easily.  Psychiatric/Behavioral: Positive for decreased concentration. Negative for sleep disturbance and dysphoric mood. The patient is not nervous/anxious.        Objective:    BP 140/76 mmHg  Pulse 77  Ht 5\' 6"  (1.676 m)  Wt 133 lb 12.8 oz (60.691 kg)  BMI 21.61 kg/m2  SpO2 96% Physical Exam  Constitutional: She is oriented to person, place, and time. She appears well-developed and well-nourished. No distress.  HENT:  Head: Normocephalic and atraumatic.  Right Ear: External ear normal.  Left Ear: External ear normal.  Nose: Nose normal.  Mouth/Throat: Oropharynx is clear and moist. No oropharyngeal exudate.  Eyes: Conjunctivae are normal. Pupils are equal, round, and reactive to light. Right eye exhibits no discharge. Left eye exhibits no discharge. No scleral icterus.  Neck: Normal range of motion. Neck supple. No tracheal deviation present. No thyromegaly present.  Cardiovascular: Normal rate, regular rhythm, normal heart sounds and intact distal pulses.  Exam reveals no gallop and no friction rub.   No murmur heard. Pulmonary/Chest: Effort normal and breath sounds normal. No respiratory distress. She has no wheezes. She has no rales. She exhibits no tenderness.  Musculoskeletal: Normal range of motion. She exhibits no edema or tenderness.  Lymphadenopathy:    She has no cervical adenopathy.  Neurological: She is alert and oriented to person, place, and time. No cranial nerve deficit or sensory deficit. She exhibits normal muscle tone. Coordination and gait abnormal.  Skin: Skin is warm and dry. No rash noted. She is not diaphoretic. No erythema. No pallor.  Psychiatric: She has a normal mood and affect. Her behavior is normal. Judgment and thought content normal. Her speech is slurred.  Slurred speech with expressive aphasia at  times          Assessment & Plan:  Over 71min of which >50% spent in face-to-face contact with patient discussing plan of care.  Problem List Items Addressed This Visit      Unprioritized   Dysarthria - Primary    Constellation of symptoms with dysarthria, dysphagia, muscle cramping, changes in gait, are concerning for ALS. Question if EMG testing might be helpful. Recent MRI brain showed no acute findings. Will set up follow up with neurology.      Dyspnea on exertion   Expressive aphasia   Memory loss    Other Visit Diagnoses    Muscle cramp        Relevant Orders    Comprehensive metabolic panel        Return  in about 3 months (around 06/08/2016) for Recheck.  Ronette Deter, MD Internal Medicine [facility]

## 2016-03-09 ENCOUNTER — Telehealth: Payer: Self-pay | Admitting: Neurology

## 2016-03-09 LAB — COMPREHENSIVE METABOLIC PANEL
ALT: 23 U/L (ref 0–35)
AST: 33 U/L (ref 0–37)
Albumin: 4.2 g/dL (ref 3.5–5.2)
Alkaline Phosphatase: 40 U/L (ref 39–117)
BUN: 24 mg/dL — ABNORMAL HIGH (ref 6–23)
CALCIUM: 9.5 mg/dL (ref 8.4–10.5)
CO2: 26 mEq/L (ref 19–32)
Chloride: 105 mEq/L (ref 96–112)
Creatinine, Ser: 1.26 mg/dL — ABNORMAL HIGH (ref 0.40–1.20)
GFR: 43.72 mL/min — AB (ref >60.00)
GLUCOSE: 79 mg/dL (ref 70–99)
POTASSIUM: 3.9 meq/L (ref 3.5–5.1)
Sodium: 140 mEq/L (ref 135–145)
TOTAL PROTEIN: 6.9 g/dL (ref 6.0–8.3)
Total Bilirubin: 0.4 mg/dL (ref 0.2–1.2)

## 2016-03-09 NOTE — Telephone Encounter (Signed)
Tried to call patient and number is disconnected. Sent patient a Therapist, music.

## 2016-03-09 NOTE — Telephone Encounter (Signed)
-----   Message from [place], DO sent at 03/09/2016  7:59 AM EDT ----- Sure.  Happy to see her back.    Luvenia Starch, will you set up f/u appt.  Pt will need in exam shorts/gown.  Thx!! ----- Message -----    From: Jackolyn Confer, MD    Sent: 03/08/2016   4:59 PM      To: Eustace Quail Tat, DO  Sorry if sending this twice. I am really concerned about her symptoms of dysarthria, dysphagia, dyspnea, and abnormal gait. I am worried about ALS and wonder if EMG might be helpful. I was hoping to get her back into see you. Thanks!! Delsa Sale

## 2016-03-10 ENCOUNTER — Encounter: Payer: Self-pay | Admitting: Internal Medicine

## 2016-03-10 ENCOUNTER — Telehealth: Payer: Self-pay | Admitting: Neurology

## 2016-03-10 NOTE — Telephone Encounter (Signed)
Appt made with patient for follow up. She states she did not get Athena testing done due to cost. We had ordered Huntington Disease DNA test.

## 2016-03-14 ENCOUNTER — Encounter: Payer: Self-pay | Admitting: *Deleted

## 2016-03-24 DIAGNOSIS — L821 Other seborrheic keratosis: Secondary | ICD-10-CM | POA: Diagnosis not present

## 2016-03-24 DIAGNOSIS — Z09 Encounter for follow-up examination after completed treatment for conditions other than malignant neoplasm: Secondary | ICD-10-CM | POA: Diagnosis not present

## 2016-03-24 DIAGNOSIS — Z872 Personal history of diseases of the skin and subcutaneous tissue: Secondary | ICD-10-CM | POA: Diagnosis not present

## 2016-04-03 ENCOUNTER — Ambulatory Visit: Payer: PPO | Admitting: Internal Medicine

## 2016-04-03 ENCOUNTER — Telehealth: Payer: Self-pay | Admitting: Internal Medicine

## 2016-04-03 NOTE — Telephone Encounter (Signed)
Pt has a neurologist appointment on June 22nd. Pt wants to know if Dr. Gilford Rile wants to see her after this appt. Pt's computer is down so do not send a message by computer.

## 2016-04-03 NOTE — Telephone Encounter (Signed)
Please advise if you want a follow up with her? thanks

## 2016-04-03 NOTE — Telephone Encounter (Signed)
Can call and schedule for the Below. thanks

## 2016-04-03 NOTE — Telephone Encounter (Signed)
Yes, in late June or early July. 97min

## 2016-04-04 ENCOUNTER — Telehealth: Payer: Self-pay

## 2016-04-04 ENCOUNTER — Other Ambulatory Visit (INDEPENDENT_AMBULATORY_CARE_PROVIDER_SITE_OTHER): Payer: PPO

## 2016-04-04 DIAGNOSIS — R799 Abnormal finding of blood chemistry, unspecified: Secondary | ICD-10-CM

## 2016-04-04 LAB — BASIC METABOLIC PANEL
BUN: 24 mg/dL — AB (ref 6–23)
CHLORIDE: 105 meq/L (ref 96–112)
CO2: 28 meq/L (ref 19–32)
Calcium: 9 mg/dL (ref 8.4–10.5)
Creatinine, Ser: 0.92 mg/dL (ref 0.40–1.20)
GFR: 62.84 mL/min (ref >60.00)
GLUCOSE: 78 mg/dL (ref 70–99)
POTASSIUM: 4.4 meq/L (ref 3.5–5.1)
Sodium: 141 mEq/L (ref 135–145)

## 2016-04-04 NOTE — Telephone Encounter (Signed)
Pt came for lab draw this morning. I did not see a future order placed. Looks like we were re-checking a BMP. Do you want any other labs ordered today?

## 2016-04-04 NOTE — Telephone Encounter (Signed)
Noted! Thank you

## 2016-04-04 NOTE — Telephone Encounter (Signed)
Yes, we were having trouble contacting her to get a BMP checked.

## 2016-04-13 ENCOUNTER — Encounter: Payer: Self-pay | Admitting: Internal Medicine

## 2016-04-13 ENCOUNTER — Encounter: Payer: Self-pay | Admitting: Neurology

## 2016-04-13 ENCOUNTER — Ambulatory Visit (INDEPENDENT_AMBULATORY_CARE_PROVIDER_SITE_OTHER): Payer: PPO | Admitting: Neurology

## 2016-04-13 VITALS — BP 118/70 | HR 73 | Ht 66.0 in | Wt 134.0 lb

## 2016-04-13 DIAGNOSIS — F458 Other somatoform disorders: Secondary | ICD-10-CM | POA: Diagnosis not present

## 2016-04-13 DIAGNOSIS — R253 Fasciculation: Secondary | ICD-10-CM | POA: Diagnosis not present

## 2016-04-13 DIAGNOSIS — R1319 Other dysphagia: Secondary | ICD-10-CM

## 2016-04-13 NOTE — Patient Instructions (Signed)
We will schedule you here for EMG.  We have left a message with scheduling at [facility] for your modified barium swallow. They should contact you directly to schedule.

## 2016-04-13 NOTE — Progress Notes (Signed)
Mary Fields was seen today in the movement disorders clinic for neurologic consultation at the request of Rica Mast, MD.  The consultation is for the evaluation of voice changes.  This has been going on for 6 months.  She previously consulted with ENT regarding this but I don't have those records.  She states that she had a scope and it was normal.  Pt states that her voice is "hoarse" and sometimes it is so "hoarse I can barely talk."  She is lower pitch than in the past.  She admits to GERD and states that she was told to take an additional ranitidine at 4 pm (was already taking one upon awakening) but it didn't help.  The records that were made available to me were reviewed from her PCP.  According to records, the patient has also had cramping of the feet, swallowing changes (pt reports that only happens when laying down in the bed and will get choked on saliva) and word finding trouble.  She has an MRI pending for 01/05/16 at [facility].  States that the word finding issues have been gradually coming on for a year and sometimes she will have to describe what is happening.  Admits to tons of stress; her dog of 16 years just died in November 11, 2022 and she just put her house up for sale.   Also states that when she was child she had asthma and her mother brought her to a clinic and they treated her with arsenic and she wonders if this, along with her cancers, were from the treatements that she received.    04/13/16 update:  The patient follows up today.  I have reviewed numerous records made available to me.  Last visit, the patient had choreiform like movements.  She had extensive lab work done.  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 Alvy Bimler.  She felt that she did not have a primary hematologic issue, but did state that since her ANA was increased at 1:80 perhaps she had a nonspecific rheumatologic issue.  The patient did not get her Huntington slams completed.  She had an MRI of  the brain that I had the opportunity to review.  This was done on 02/16/2016.  There was moderate small vessel disease and mild atrophy.  The patient has moved into assisted living since our last visit.  The patient has seen Dr. Gilford Rile many times since our last visit.  Dr. Gilford Rile has been concern for possible motor neuron disease.  No falls since last visit.  States that she is able to negotiate steps at her apartment.   States that she is getting choked a lot and feels that is part of the reason she can't do MRI well because every time she lays back she gets choked.     ALLERGIES:   Allergies  Allergen Reactions  . Prednisone Hives  . Sulfonamide Derivatives Other (See Comments)    Skin whelps  . Meperidine Other (See Comments)    Unspecified  . Ciprofloxacin Diarrhea    REACTION: diarrhea    CURRENT MEDICATIONS:  Outpatient Encounter Prescriptions as of 04/13/2016  Medication Sig  . acyclovir (ZOVIRAX) 400 MG tablet Take 1 tablet (400 mg total) by mouth 4 (four) times daily.  Marland Kitchen aspirin 81 MG tablet Take 81 mg by mouth every other day.   . Estrogens, Conjugated (PREMARIN VA) Place 1 g vaginally once a week.  . Fluticasone Furoate-Vilanterol (BREO ELLIPTA) 100-25 MCG/INH AEPB Inhale 1  puff into the lungs daily.  . Multiple Vitamin (MULTIVITAMIN) tablet Take 1 tablet by mouth daily.    . NON FORMULARY Ease Takes 1 tablet by mouth daily.  . pantoprazole (PROTONIX) 40 MG tablet TAKE ONE TABLET BY MOUTH EVERY DAY  . Propylene Glycol (SYSTANE BALANCE OP) 1 drop daily.   . ranitidine (ZANTAC) 300 MG tablet Take 1 tablet (300 mg total) by mouth at bedtime. (Patient taking differently: Take 300 mg by mouth at bedtime as needed. )  . vitamin C (ASCORBIC ACID) 500 MG tablet Take 1 tablet (500 mg total) by mouth daily.   No facility-administered encounter medications on file as of 04/13/2016.    PAST MEDICAL HISTORY:   Past Medical History  Diagnosis Date  . Other malaise and fatigue   .  Benign neoplasm of skin, site unspecified     Dr. Aubery Lapping  . 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 Dizziness and giddiness   . Palpitations   . Herpes simplex without mention of complication   . Ulcerative colitis, unspecified   . Malignant neoplasm of kidney, except pelvis   . Disorder of bone and cartilage, unspecified   . Esophageal reflux   . Personal history of other malignant neoplasm of skin   . Personal history of malignant neoplasm of breast   . Hx-TIA (transient ischemic attack)   . Atypical nevi   . Diverticulosis   . Erosive esophagitis   . Colitis   . Unspecified asthma(493.90)     since childhood  . Hip fracture ([place]) 11/12    surgery  . Vaginal atrophy   . Vaginal stenosis   . Palpitations     a. 07/2015 Echo: EF 60-65%, Gr 1 DD, mild AI.  Marland Kitchen Dyspnea on exertion   . Tobacco abuse     PAST SURGICAL HISTORY:   Past Surgical History  Procedure Laterality Date  . Melanoma excision  1980    Dr. Sharlet Salina  . Nephrectomy  2000    left renal cell carcinoma, Dr. Jacqlyn Larsen  . Hip fracture surgery Right 2005    hemiarthroplasty right, Dr. Francia Greaves  . Esophagogastroduodenoscopy  2002    esophagitis; grade 1, erosive  . Colonoscopy  4/10    diverticulosis; recheck 5 years  . Appendectomy    . Abdominal hysterectomy  1986    endometriosis, Dr. Pearletha Furl  . Breast lumpectomy Right 1995    right breast cancer, lumpectomy and XRT and chemo  . Hip fracture surgery Left 11/12    [facility]    SOCIAL HISTORY:   Social History   Social History  . Marital Status: Widowed    Spouse Name: N/A  . Number of Children: N/A  . Years of Education: N/A   Occupational History  . Not on file.   Social History Main Topics  . Smoking status: Former Smoker    Quit date: 08/02/1984  . Smokeless tobacco: Never Used  . Alcohol Use: 0.0 oz/week    0 Standard drinks or equivalent per week     Comment: 1 beer once a month  . Drug Use: No   . Sexual Activity: Not on file   Other Topics Concern  . Not on file   Social History Narrative   Widowed. Lives in [place].      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 Paternal Grandmother Deceased   . Mother Deceased 34    healthy  . Father Deceased     emphysema (smoker)  . Sister Alive     healthy   Pt undressed, placed into examining shorts and no shirt (pt refused gown).  Chaperone present  ROS:  A complete 10 system review of systems was obtained and was unremarkable apart from what is mentioned above.  PHYSICAL EXAMINATION:    VITALS:   Filed Vitals:   04/13/16 0921  BP: 118/70  Pulse: 73  Height: 5\' 6"  (1.676 m)  Weight: 134 lb (60.782 kg)    GEN:  The patient appears stated age and is in NAD.  Tearful at times. HEENT:  Normocephalic, atraumatic.  The mucous membranes are moist. The superficial temporal arteries are without ropiness or tenderness. CV:  RRR Lungs:  CTAB Neck/HEME:  There are no carotid bruits bilaterally. MS:  Very deep and elongated bruise over right hip from fall yesterday.  Neurological examination:  Orientation:  Montreal Cognitive Assessment  04/13/2016  Visuospatial/ Executive (0/5) 5  Naming (0/3) 3  Attention: Read list of digits (0/2) 2  Attention: Read list of letters (0/1) 1  Attention: Serial 7 subtraction starting at 100 (0/3) 3  Language: Repeat phrase (0/2) 2  Language : Fluency (0/1) 0  Abstraction (0/2) 2  Delayed Recall (0/5) 2  Orientation (0/6) 6  Total 26  Adjusted Score (based on education) 26   Cranial nerves: There is good facial symmetry. Pupils are equal round and reactive to light bilaterally. Fundoscopic exam reveals clear margins bilaterally. Extraocular muscles are intact. The visual fields are full to confrontational testing. The speech is fluent and clear.  No hypophonic speech.  Minimal word finding trouble.  No  significant dysphasia. Soft palate rises symmetrically and there is no tongue deviation. Hearing is intact to conversational tone. Sensation: Sensation is intact to light and pinprick throughout (facial, trunk, extremities). Vibration is Decreased at the bilateral big toe. There is no extinction with double simultaneous stimulation. There is no sensory dermatomal level identified. Motor: Strength is 5/5 in the bilateral upper and lower extremities.   Shoulder shrug is equal and symmetric.  There is no pronator drift. Deep tendon reflexes: Deep tendon reflexes are 2+/4 at the bilateral biceps, triceps, brachioradialis, 3/4 at the bilateral patella and 1/4 at the bilateral achilles. Striatal toe is present on the right.  L toe is neutral.    Movement examination: Tone: There is normal tone in the bilateral upper extremities.  The tone in the lower extremities is normal.  Abnormal movements: There is choreiform like movements in the fingers of the right greater than left upper extremity. There are fasciculations (rare) in the right gastroc, L vastus medialis, bilateral deltoid and multiple forearm mm bilaterally. No tongue fasiculations.    Coordination:  There is only trouble with decremation rapid alternating movements with toe taps on the left.  All other forms of rapid alternating movements were normal.  Gait and Station: The patient has no difficulty arising out of a deep-seated chair without the use of the hands. The patient's stride length is normal, but there is evidence of chorea in the fingers when she walks.     Chemistry      Component Value Date/Time   NA 141 04/04/2016 0900   NA 139 08/02/2012 0849   K 4.4 04/04/2016 0900   CL 105 04/04/2016 0900   CO2 28 04/04/2016 0900   BUN  24* 04/04/2016 0900   BUN 15 08/02/2012 0849   CREATININE 0.92 04/04/2016 0900      Component Value Date/Time   CALCIUM 9.0 04/04/2016 0900   ALKPHOS 40 03/08/2016 1613   AST 33 03/08/2016 1613   ALT 23  03/08/2016 1613   BILITOT 0.4 03/08/2016 1613     Lab Results  Component Value Date   TSH 1.27 12/16/2015   Lab Results  Component Value Date   ESRSEDRATE 8 12/27/2015     Lab Results  Component Value Date   VITAMINB12 374 12/16/2015     ASSESSMENT/PLAN:  1.  Chorea  -Did not get HD labs done as we had requested  -ANA was slightly positive and may need to investigate further although suspect incidental. 2.  Fasciculations, hyperreflexia and dysphagia  -she did not want an MRI cervical spine even with oral sedation but was able to recently do MRI brain with only taking benadryl  -will do EMG to r/o MND.  Will need more lab work if EMG positive for this.  -will do swallow eval at [facility].  We will determine follow-up based on the results of the above testing.  Much greater than 50% of this visit was spent in counseling/coordinating care.  Total face to face time:  30 min

## 2016-04-17 ENCOUNTER — Encounter: Payer: Self-pay | Admitting: Internal Medicine

## 2016-04-17 ENCOUNTER — Telehealth: Payer: Self-pay | Admitting: Internal Medicine

## 2016-04-17 NOTE — Telephone Encounter (Signed)
Called patient back x2.  No answer.  Left a message to call the office back.

## 2016-04-17 NOTE — Telephone Encounter (Signed)
Pt is upset that Dr. Gilford Rile is leaving and she would like to talk to someone.

## 2016-04-18 ENCOUNTER — Encounter: Payer: Self-pay | Admitting: Family Medicine

## 2016-04-19 NOTE — Telephone Encounter (Addendum)
Attempted to continue to follow patient regarding notes below.  No answer.  MyChart correspondence in chart review.  Left a message on home phone to call the office if needed.

## 2016-05-02 ENCOUNTER — Encounter: Payer: PPO | Admitting: Neurology

## 2016-05-02 ENCOUNTER — Telehealth: Payer: Self-pay | Admitting: Neurology

## 2016-05-02 ENCOUNTER — Ambulatory Visit (INDEPENDENT_AMBULATORY_CARE_PROVIDER_SITE_OTHER): Payer: PPO | Admitting: Neurology

## 2016-05-02 DIAGNOSIS — R253 Fasciculation: Secondary | ICD-10-CM

## 2016-05-02 DIAGNOSIS — G1220 Motor neuron disease, unspecified: Secondary | ICD-10-CM

## 2016-05-02 NOTE — Telephone Encounter (Signed)
Patient never heard back from [place] about scheduling [facility]. Need to give phone number to contact scheduling department [phone].   Left message on machine for patient to call back to make appt and give her scheduling number.

## 2016-05-02 NOTE — Telephone Encounter (Signed)
-----   Message from [place], DO sent at 05/02/2016  1:31 PM EDT ----- Pt needs follow up visit with me (prefer if we can find end day or AM)

## 2016-05-02 NOTE — Procedures (Signed)
[facility]  [place], [place]  [place], [place] [address] Tel: [phone] Fax:  [phone] Test Date:  05/02/2016  Patient: Mary Fields DOB: Mar 22, 1941 Physician: Narda Amber, DO  Sex: Female Height: 5\' 6"  Ref Phys: Alonza Bogus, M.D.  ID#: [id number] Temp: 32.6C Technician: Jerilynn Mages. Dean   Patient Complaints: This is a 75 year old female referred for evaluation of dysarthria, dysphasia, muscle fasciculations and muscle cramps in the lower extremities.  NCV & EMG Findings: Extensive electrodiagnostic testing of the right upper extremity, right lower extremity, midthoracic paraspinal muscles (T7 and T11 levels), and bulbar muscles shows: 1. All sensory responses including the right median, ulnar, radial, peroneal, and sural nerves are within normal limits. 2. The right ulnar motor response recording at the abductor digiti minimi is markedly reduced, however, motor response at the first dorsal interosseous muscle is within normal limits. The remaining motor responses including the right median, peroneal, and tibial nerves are within normal limits. 3. Bilateral tibial H reflex studies are prolonged and uncertain clinical significance. 4. In the right upper extremity, chronic motor axon loss changes are isolated to the first dorsal interosseous abductor pollicis brevis muscles. Active denervation is present in the first dorsal interosseous muscle. 5. In the right lower extremity, sparse active denervation is seen in the tibialis anterior muscle.  None of the tested muscles of the lower extremity show any chronic motor axon loss changes. 6. There is no evidence of active or chronic motor axon loss changes at the T7 and T11 levels. 7. Chronic motor axon loss changes are seen affecting the mentalis and hyoglossus, with active changes also present in the latter. 8. Fasciculation potentials are seen in 7 of the 18 tested muscles.  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  Clinical correlation recommended.     ___________________________ Narda Amber, DO    Nerve Conduction Studies Anti Sensory Summary Table   Site NR Peak (ms) Norm Peak (ms) P-T Amp (V) Norm P-T Amp  Right Median Anti Sensory (2nd Digit)  32.6C  Wrist    3.8 <3.8 34.7 >10  Right Radial Anti Sensory (Base 1st Digit)  32.6C  Wrist    2.3 <2.8 21.2 >10  Right Sup Peroneal Anti Sensory (Ant Lat Mall)  12 cm    2.9 <4.6 4.6 >3  Right Sural Anti Sensory (Lat Mall)  Calf    3.6 <4.6 11.4 >3  Right Ulnar Anti Sensory (5th Digit)  32.6C  Wrist    3.1 <3.2 33.5 >5   Motor Summary Table   Site NR Onset (ms) Norm Onset (ms) O-P Amp (mV) Norm O-P Amp Site1 Site2 Delta-0 (ms) Dist (cm) Vel (m/s) Norm Vel (m/s)  Right Median Motor (Abd Poll Brev)  32.6C  Wrist    4.0 <4.0 5.5 >5 Elbow Wrist 4.4 25.0 57 >50  Elbow    8.4  4.6         Right Peroneal Motor (Ext Dig Brev)  32.6C  Ankle    3.4 <6.0 3.9 >2.5 B Fib Ankle 7.1 34.0 48 >40  B Fib    10.5  3.1  Poplt B Fib 2.1 10.0 48 >40  Poplt  12.6  3.1         Right Peroneal TA Motor (Tib Ant)  32.6C  Fib Head    3.5 <4.5 4.0 >3 Poplit Fib Head 0.7 10.0 143 >40  Poplit    4.2  4.0         Right Tibial Motor (Abd Hall Brev)  32.6C  Ankle    3.5 <6.0 4.0 >4 Knee Ankle 9.6 39.0 41 >40  Knee    13.1  2.5         Right Ulnar Motor (Abd Dig Minimi)  32.6C  Wrist    3.0 <3.1 6.4 >7 B Elbow Wrist 3.6 21.0 58 >50  B Elbow    6.6  5.7  A Elbow B Elbow 1.7 10.0 59 >50  A Elbow    8.3  5.7         Right Ulnar (FDI) Motor (1st DI)  32.6C  Wrist    3.9 <4.5 11.7 >7 B Elbow  Wrist 3.5 21.0 60 >50  B Elbow    7.4  10.5  A Elbow B Elbow 1.6 10.0 63 >50  A Elbow    9.0  9.6          H Reflex Studies   NR H-Lat (ms) Lat Norm (ms) L-R H-Lat (ms)  Left Tibial (Gastroc)  32.6C     51.70 <35 0.14  Right Tibial (Gastroc)  32.6C     51.84 <35 0.14   EMG   Side Muscle Ins Act Fibs Psw Fasc Number Recrt Dur Dur. Amp Amp. Poly Poly. Comment  Right T7 Parasp Nml Nml Nml Nml Nml - - - - - - - N/A  Right T11 Parasp Nml Nml Nml Nml Nml - - - - - - - N/A  Right 1stDorInt Nml Nml 1+ 2+ 1- Rapid Few 1+ Few 1+ Nml Nml N/A  Right Hyoglossus Nml Nml 1+ 1+ 1- Mod-R Few 1+ Nml Nml Nml Nml N/A  Right Mentalis Nml Nml Nml 1+ 1- Mod-R Few 1+ Nml Nml Nml Nml N/A  Right Abd Poll Brev Nml Nml Nml 1+ 1- Mod-R Few 1+ Few 1+ Nml Nml N/A  Right Gastroc Nml Nml 1+ Nml 1- Mod-V Nml Nml Nml Nml Nml Nml N/A  Right AntTibialis Nml 1+ Nml 1+ Nml Nml Nml Nml Nml Nml Nml Nml N/A  Right GluteusMed Nml Nml Nml Nml Nml Nml Nml Nml Nml Nml Nml Nml N/A  Right BicepsFemS Nml Nml Nml Nml Nml Nml Nml Nml Nml Nml Nml Nml N/A  Right Ext Indicis Nml Nml Nml Nml Nml Nml Nml Nml Nml Nml Nml Nml N/A  Right PronatorTeres CRD Nml Nml 1+ Nml Nml Nml Nml Nml Nml Nml Nml N/A  Right Biceps Nml Nml Nml 1+ Nml Nml Nml Nml Nml Nml Nml Nml N/A  Right Triceps Nml Nml Nml 1+ Nml Nml Nml Nml Nml Nml Nml Nml N/A  Right Deltoid Nml Nml Nml Nml Nml Nml Nml Nml Nml Nml Nml Nml N/A  Right Cervical Parasp Low Nml Nml Nml Nml Nml Nml Nml Nml Nml Nml Nml Nml N/A  Right Flex Dig Long Nml Nml Nml Nml Nml Nml Nml Nml Nml Nml Nml Nml N/A  Right RectFemoris Nml Nml Nml Nml Nml Nml Nml Nml Nml Nml Nml Nml N/A      Waveforms:

## 2016-05-04 ENCOUNTER — Telehealth: Payer: Self-pay | Admitting: Neurology

## 2016-05-04 NOTE — Telephone Encounter (Signed)
Follow up appt scheduled (no end of day or AM available). Patient given number to schedule MBE and will call me with any problems.

## 2016-05-04 NOTE — Telephone Encounter (Signed)
Mary Fields 11/04/1940. She would like you to please call [facility] about her test. She said she called today to make appointment and they did not have an order. Her number is [phone]. Thank you.

## 2016-05-05 NOTE — Telephone Encounter (Signed)
Left message with [facility] [phone]) to call me back.

## 2016-05-08 ENCOUNTER — Other Ambulatory Visit: Payer: Self-pay | Admitting: Neurology

## 2016-05-08 DIAGNOSIS — R1310 Dysphagia, unspecified: Secondary | ICD-10-CM

## 2016-05-08 NOTE — Telephone Encounter (Signed)
Mary Fields left message on my voicemail that [facility], schedules these exams and was off work Friday. I tried to call Mary Fields back today- but she does not work Mondays. She did say she sees the order. Left another message with scheduling to please call patient after 3 pm due to her work schedule to get her scheduled for MBE. They were to call me with any questions.

## 2016-05-15 ENCOUNTER — Encounter: Payer: Self-pay | Admitting: Internal Medicine

## 2016-05-15 ENCOUNTER — Ambulatory Visit (INDEPENDENT_AMBULATORY_CARE_PROVIDER_SITE_OTHER): Payer: PPO | Admitting: Internal Medicine

## 2016-05-15 ENCOUNTER — Telehealth: Payer: Self-pay | Admitting: *Deleted

## 2016-05-15 VITALS — BP 132/80 | HR 80 | Ht 66.0 in | Wt 134.2 lb

## 2016-05-15 DIAGNOSIS — G1221 Amyotrophic lateral sclerosis: Secondary | ICD-10-CM | POA: Insufficient documentation

## 2016-05-15 DIAGNOSIS — G1220 Motor neuron disease, unspecified: Secondary | ICD-10-CM | POA: Diagnosis not present

## 2016-05-15 MED ORDER — ACYCLOVIR 800 MG PO TABS: 800.0000 mg | ORAL_TABLET | Freq: Two times a day (BID) | ORAL | 3 refills | Status: DC

## 2016-05-15 MED ORDER — PANTOPRAZOLE SODIUM 40 MG PO TBEC: 40.0000 mg | DELAYED_RELEASE_TABLET | Freq: Every day | ORAL | 3 refills | Status: AC

## 2016-05-15 NOTE — Progress Notes (Signed)
Subjective:    Patient ID: Mary Fields, female    DOB: 1941/01/12, 75 y.o.   MRN: [id number]  HPI  75YO female presents for follow up.  Recently, being evaluated for dyspnea, dysarthria, weakness. 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  Scheduled for swallow study this week.  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 Lives alone in an apartment at [address] [place] in 4th floor. She walks stairs, however elevator is available. Thinking about starting aerobics. Concerned that voice is getting more hoarse and speech more difficult. Has lost 4lbs by her scale. No NV, however hiccups after meals.  Notes twitching in the muscles of her arms. Feels like muscles are generally weak.  She does not have family nearby. Closest is sister in [place], [place].  No improvement in dyspnea with use of Breo. Plans to stop medication.   Wt Readings from Last 3 Encounters:  05/15/16 134 lb 3.2 oz (60.9 kg)  04/13/16 134 lb (60.8 kg)  03/08/16 133 lb 12.8 oz (60.7 kg)   BP Readings from Last 3 Encounters:  05/15/16 132/80  04/13/16 118/70  03/08/16 140/76    Past Medical History:  Diagnosis Date  . Atypical nevi   . Benign neoplasm of skin, site unspecified    Dr. Aubery Lapping  . Colitis   . 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 Disorder of bone and cartilage, unspecified   . Diverticulosis   . Dizziness and giddiness   . Dyspnea on exertion   . Erosive esophagitis   . Esophageal reflux   . Herpes simplex without mention of complication   . Hip fracture ([place]) 11/12   surgery  . Hx-TIA (transient ischemic attack)   . Malignant neoplasm of kidney, except pelvis   . Other malaise and fatigue   . Palpitations   . Palpitations    a. 07/2015 Echo: EF 60-65%, Gr 1 DD, mild AI.  Marland Kitchen Personal history of malignant neoplasm of breast   . Personal history of other malignant neoplasm of skin   . Tobacco abuse   . Ulcerative  colitis, unspecified   . Unspecified asthma(493.90)    since childhood  . Vaginal atrophy   . Vaginal stenosis    Family History  Problem Relation Age of Onset  . Heart disease Paternal Grandmother   . Hypertension Paternal Grandmother   . Stomach cancer Maternal Grandmother   . Kidney disease Maternal Uncle   . Cancer Paternal Aunt     breast cancer   Past Surgical History:  Procedure Laterality Date  . ABDOMINAL HYSTERECTOMY  1986   endometriosis, Dr. Pearletha Furl  . APPENDECTOMY    . BREAST LUMPECTOMY Right 1995   right breast cancer, lumpectomy and XRT and chemo  . COLONOSCOPY  4/10   diverticulosis; recheck 5 years  . ESOPHAGOGASTRODUODENOSCOPY  2002   esophagitis; grade 1, erosive  . HIP FRACTURE SURGERY Right 2005   hemiarthroplasty right, Dr. Francia Greaves  . HIP FRACTURE SURGERY Left 11/12   after fall at church  . MELANOMA EXCISION  1980   Dr. Sharlet Salina  . NEPHRECTOMY  2000   left renal cell carcinoma, Dr. Jacqlyn Larsen   Social History   Social History  . Marital status: Widowed    Spouse name: N/A  . Number of children: N/A  . Years of education: N/A   Social History Main Topics  . Smoking status: Former Smoker    Quit date: 08/02/1984  .  Smokeless tobacco: Never Used  . Alcohol use 0.0 oz/week     Comment: 1 beer once a month  . Drug use: No  . Sexual activity: Not Asked   Other Topics Concern  . None   Social History Narrative   Widowed. Lives in [place].      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 Negative for appetite change, chills, fever and unexpected weight change.  Eyes: Negative for visual disturbance.  Respiratory: Positive for shortness of breath. Negative for cough and chest tightness.   Cardiovascular: Negative for chest pain, palpitations and leg swelling.  Gastrointestinal: Negative for abdominal pain, constipation and diarrhea.  Genitourinary: Negative for dysuria and  flank pain.  Musculoskeletal: Positive for myalgias. Negative for arthralgias.  Skin: Negative for color change and rash.  Neurological: Positive for tremors, speech difficulty and weakness. Negative for dizziness, light-headedness, numbness and headaches.  Hematological: Negative for adenopathy. Does not bruise/bleed easily.  Psychiatric/Behavioral: Positive for behavioral problems and dysphoric mood. Negative for suicidal ideas. The patient is nervous/anxious.        Objective:    BP 132/80 (BP Location: Left Arm, Patient Position: Sitting)   Pulse 80   Ht 5\' 6"  (1.676 m)   Wt 134 lb 3.2 oz (60.9 kg)   SpO2 95%   BMI 21.66 kg/m  Physical Exam  Constitutional: She is oriented to person, place, and time. She appears well-developed and well-nourished. No distress.  HENT:  Head: Normocephalic and atraumatic.  Right Ear: External ear normal.  Left Ear: External ear normal.  Nose: Nose normal.  Mouth/Throat: Oropharynx is clear and moist. No oropharyngeal exudate.  Eyes: Conjunctivae are normal. Pupils are equal, round, and reactive to light. Right eye exhibits no discharge. Left eye exhibits no discharge. No scleral icterus.  Neck: Normal range of motion. Neck supple. No tracheal deviation present. No thyromegaly present.  Cardiovascular: Normal rate, regular rhythm, normal heart sounds and intact distal pulses.  Exam reveals no gallop and no friction rub.   No murmur heard. Pulmonary/Chest: Effort normal and breath sounds normal. No respiratory distress. She has no wheezes. She has no rales. She exhibits no tenderness.  Musculoskeletal: Normal range of motion. She exhibits no edema or tenderness.  Lymphadenopathy:    She has no cervical adenopathy.  Neurological: She is alert and oriented to person, place, and time. She displays atrophy and tremor. No cranial nerve deficit or sensory deficit. She exhibits abnormal muscle tone. Gait (unsteady) abnormal. Coordination normal.    Fasciculations noted in hands and forearms bilaterally. Generalized atrophy noted in arms and legs.  Skin: Skin is warm and dry. No rash noted. She is not diaphoretic. No erythema. No pallor.  Psychiatric: Her behavior is normal. Judgment and thought content normal. Her mood appears anxious. Her speech is slurred. Cognition and memory are normal. She exhibits a depressed mood.          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 Recent EMG testing suggests motor neuron disease. She has already researched this and understands potential implications. Reports she has long term care insurance policy and knows she will likely need assistance in her home. Her closest family is in [place] (sister) and cousin [place], [place]), however neither are able  to assist with her care. She would like to meet with Dr. Carles Collet to better understand expected progression, then follow up with Dr. Caryl Bis here to discuss potential care needs. Offered support today.        Other Visit Diagnoses   None.      Return in about 4 weeks (around 06/12/2016) for New Patient.  Ronette Deter, MD Internal Medicine [facility]

## 2016-05-15 NOTE — Progress Notes (Signed)
Pre visit review using our clinic review tool, if applicable. No additional management support is needed unless otherwise documented below in the visit note. 

## 2016-05-15 NOTE — Assessment & Plan Note (Signed)
Recent progression of dysarthria, dyspnea, emotional lability. Recent EMG testing suggests motor neuron disease. She has already researched this and understands potential implications. Reports she has long term care insurance policy and knows she will likely need assistance in her home. Her closest family is in [place] (sister) and cousin [place], [place]), however neither are able to assist with her care. She would like to meet with Dr. Carles Collet to better understand expected progression, then follow up with Dr. Caryl Bis here to discuss potential care needs. Offered support today.

## 2016-05-15 NOTE — Telephone Encounter (Signed)
Patient requested a follow up appt with Dr Caryl Bis after August 10. She would like to be seen the month of August. She will need a time and date for a 30 min follow up [place].

## 2016-05-15 NOTE — Patient Instructions (Signed)
Change Acyclovir to 800mg  twice daily for 5 days with a breakout.  Follow up with Dr. Carles Collet as scheduled.

## 2016-05-16 NOTE — Telephone Encounter (Signed)
Scheduled patient for appointment on August 11th

## 2016-05-16 NOTE — Telephone Encounter (Signed)
Jamie please assist, patient is transferring care from [facility] needs appt in August.  thanks

## 2016-05-18 ENCOUNTER — Ambulatory Visit
Admission: RE | Admit: 2016-05-18 | Discharge: 2016-05-18 | Disposition: A | Discharge status: Home / Self Care | Payer: PPO | Source: Ambulatory Visit | Attending: Neurology | Admitting: Neurology

## 2016-05-18 DIAGNOSIS — G1229 Other motor neuron disease: Secondary | ICD-10-CM | POA: Diagnosis not present

## 2016-05-18 DIAGNOSIS — G1220 Motor neuron disease, unspecified: Secondary | ICD-10-CM | POA: Diagnosis not present

## 2016-05-18 DIAGNOSIS — R1310 Dysphagia, unspecified: Secondary | ICD-10-CM | POA: Insufficient documentation

## 2016-05-18 DIAGNOSIS — R1319 Other dysphagia: Secondary | ICD-10-CM

## 2016-05-18 NOTE — Therapy (Addendum)
[place], [place], [address] Phone: [phone]   Fax:     Modified Barium Swallow  Patient Details  Name: Mary Fields MRN: [id number] Date of Birth: Jun 26, 1941 No Data Recorded  Encounter Date: 05/18/2016   Subjective: Patient behavior: (alertness, ability to follow instructions, etc.): Pt was alert, talkative. Appeared min nervous; noted mild tremor/shakiness in UE movements. Speech rate was slow; speech was dysarthric but intelligible. Pt stated the issues w/ her speech had been "progressing" over the last few months.  Chief complaint: Dysphagia. Pt denied any consistent issues w/ her swallowing stating she ate a fairly "regular" diet at home and took her pills w/ liquids. She c/o "getting choked at night" and described it was moreso w/ saliva.    Objective:  Radiological Procedure: A videoflouroscopic evaluation of oral-preparatory, reflex initiation, and pharyngeal phases of the swallow was performed; as well as a screening of the upper esophageal phase.  I. POSTURE: upright II. VIEW: lateral  III. COMPENSATORY STRATEGIES: IV. BOLUSES ADMINISTERED:  Thin Liquid: 6 trials  Nectar-thick Liquid: 1 trial  Honey-thick Liquid: NT  Puree: 3 trials  Mechanical Soft: 2 trials  V. RESULTS OF EVALUATION: A. ORAL PREPARATORY PHASE: (The lips, tongue, and velum are observed for strength and coordination)       **Overall Severity Rating: MILD. Noted increased lingual movements during attempt to manage/control bolus for A-P transfer; bolus loss and premature spillage - much less control and coordination w/ larger bolus sips of thin liquids vs a food trial. Fairly adequate oral clearing post swallow w/ all consistencies noted.   B. SWALLOW INITIATION/REFLEX: (The reflex is normal if "triggered" by the time the bolus reached the base of the tongue)  **Overall Severity Rating: grossly WFL. Bolus consistencies  appeared to trigger a pharyngeal swallow at, or spilling from, the valleculae - again, w/ larger bolus of thin liquid, the liquid appeared to spill into the pyriform sinuses moreso than w/ food trials.   C. PHARYNGEAL PHASE: (Pharyngeal function is normal if the bolus shows rapid, smooth, and continuous transit through the pharynx and there is no pharyngeal residue after the swallow)  **Overall Severity Rating: grossly WFL. No significant pharyngeal residue noted post swallow indicating fairly adequate pharyngeal pressure and laryngeal excursion during the swallow.  D. LARYNGEAL PENETRATION: (Material entering into the laryngeal inlet/vestibule but not aspirated): None.   E. ASPIRATION: x1 w/ initial swallow of thin liquids - NO further aspiration noted to occur during this exam F. ESOPHAGEAL PHASE: (Screening of the upper esophagus): no noted Esophageal dysmotility in the viewable Cervical Esophagus  ASSESSMENT: Pt appeared to exhibit mild oral phase dysphagia during this exam today. She exhibited reduce lingual coordination and control during A-P transfer of the liquid boluses (thin) w/ increased episodes of premature spillage of the boluses into the pharynx. Increased bolus control and timely management noted w/ smaller size boluses taken via cup drinking. Adequate oral clearing noted post swallow. Timing of the pharyngeal swallow was grossly wfl for age - NO aspiration or laryngeal penetration noted w/ po trials noted during this study. No significant pharyngeal residue remained post swallow.  Any oral phase deficits can impact the pharyngeal phase of swallowing and increase risk for aspiration. Pt appears to be tolerating current po diet w/out overt s/s of aspiration at this time per her report. Recommend general aspiration precautions. Per MD note and brief discussion w/ pt, there is concern for Motor Neuron Disease per recent  EMG testing. Would strongly recommend thorough discussion w/ pt of the  Disease itself and any potential impact and/or progression of such on swallowing - risk for aspiration, meeting nutritional needs, etc. Recommend possible recommendation to a Specialty Clinic to f/u w/ education, monitoring of status, and emotional support. STRONGLY RECOMMEND PILLS BE SWALLOWED W/ A PUREE FOR SAFER SWALLOWING.    PLAN/RECOMMENDATIONS:  A. Diet: mech soft-type diet(foods that are cooked, soft, and moistened well); thin liquids - No straws if coughing occurs during use  B. Swallowing Precautions: general aspiration precautions; strategies of f/u, dry swallow w/ each bite and sip; use of a Puree(applesauce, yogurt, ice cream) to aid swallowing of Pills/Medications  C. Recommended consultation to: Dietician for any nutritional needs/education  D. Therapy recommendations: none at this time  E. Results and recommendations were discussed w/ pt; video viewed      End of Session - 2016/06/07 1647    Visit Number 1   Number of Visits 1   Date for SLP Re-Evaluation 07-Jun-2016   SLP Start Time 1315   SLP Stop Time  1415   SLP Time Calculation (min) 60 min   Activity Tolerance Patient tolerated treatment well      Past Medical History:  Diagnosis Date  . Atypical nevi   . Benign neoplasm of skin, site unspecified    Dr. Aubery Lapping  . Colitis   . 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 Disorder of bone and cartilage, unspecified   . Diverticulosis   . Dizziness and giddiness   . Dyspnea on exertion   . Erosive esophagitis   . Esophageal reflux   . Herpes simplex without mention of complication   . Hip fracture ([place]) 11/12   surgery  . Hx-TIA (transient ischemic attack)   . Malignant neoplasm of kidney, except pelvis   . Other malaise and fatigue   . Palpitations   . Palpitations    a. 07/2015 Echo: EF 60-65%, Gr 1 DD, mild AI.  Marland Kitchen Personal history of malignant neoplasm of breast   . Personal history of other malignant neoplasm of skin   .  Tobacco abuse   . Ulcerative colitis, unspecified   . Unspecified asthma(493.90)    since childhood  . Vaginal atrophy   . Vaginal stenosis     Past Surgical History:  Procedure Laterality Date  . ABDOMINAL HYSTERECTOMY  1986   endometriosis, Dr. Pearletha Furl  . APPENDECTOMY    . BREAST LUMPECTOMY Right 1995   right breast cancer, lumpectomy and XRT and chemo  . COLONOSCOPY  4/10   diverticulosis; recheck 5 years  . ESOPHAGOGASTRODUODENOSCOPY  2002   esophagitis; grade 1, erosive  . HIP FRACTURE SURGERY Right 2005   hemiarthroplasty right, Dr. Francia Greaves  . HIP FRACTURE SURGERY Left 11/12   after fall at church  . MELANOMA EXCISION  1980   Dr. Sharlet Salina  . NEPHRECTOMY  2000   left renal cell carcinoma, Dr. Jacqlyn Larsen    There were no vitals filed for this visit.              Patient will benefit from skilled therapeutic intervention in order to improve the following deficits and impairments:   Dysphagia, neurologic - Plan: SLP modified barium swallow, SLP modified barium swallow  Dysphagia - Plan: DG OP Swallowing Func-Medicare/Speech Path, DG OP Swallowing Func-Medicare/Speech Path      G-Codes - 06-07-16 1700    Functional Assessment Tool Used clinical judgement   Functional  Limitations Swallowing   Swallow Current Status [phone]) At least 1 percent but less than 20 percent impaired, limited or restricted   Swallow Goal Status [id number]) At least 1 percent but less than 20 percent impaired, limited or restricted   Swallow Discharge Status [phone]) At least 1 percent but less than 20 percent impaired, limited or restricted          Problem List Patient Active Problem List   Diagnosis Date Noted  . 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 History of melanoma 01/20/2016  . Coagulation test abnormality 01/20/2016  . Hematoma 01/14/2016  . Right hip pain 12/28/2015  . Fall 12/28/2015  . Routine general medical examination at a health care facility 12/16/2015  .  Memory loss 12/16/2015  . Muscle cramps at night 12/16/2015  . Dysarthria 12/16/2015  . Expressive aphasia 12/16/2015  . Bereavement 11/18/2015  . Hoarseness 09/22/2015  . Special screening for malignant neoplasms, colon 09/22/2015  . Dyspnea on exertion 07/21/2015  . History of renal cell cancer 09/07/2014  . Urinary incontinence 09/07/2014  . Contact dermatitis 04/08/2014  . Stopped smoking with greater than 25 pack year history 02/25/2014  . Irregular heart beat 01/30/2014  . Allergic rhinitis 01/30/2014  . Medicare annual wellness visit, subsequent 08/08/2012  . Screening for breast cancer 08/08/2012  . Atrophic vaginitis 08/08/2012  . GERD (gastroesophageal reflux disease) 05/07/2012  . History of cardiovascular disorder 04/30/2009  . COLITIS, ULCERATIVE 05/16/2007  . Personal history of malignant neoplasm of breast 05/16/2007    Watson,Katherine 05/18/2016, 5:02 PM  [place], [place], [address] Phone: [phone]   Fax:     Name: Mary Fields MRN: [id number] Date of Birth: 1941/06/21

## 2016-05-30 NOTE — Progress Notes (Signed)
Mary Fields was seen today in the movement disorders clinic for neurologic consultation at the request of Rica Mast, MD.  The consultation is for the evaluation of voice changes.  This has been going on for 6 months.  She previously consulted with ENT regarding this but I don't have those records.  She states that she had a scope and it was normal.  Pt states that her voice is "hoarse" and sometimes it is so "hoarse I can barely talk."  She is lower pitch than in the past.  She admits to GERD and states that she was told to take an additional ranitidine at 4 pm (was already taking one upon awakening) but it didn't help.  The records that were made available to me were reviewed from her PCP.  According to records, the patient has also had cramping of the feet, swallowing changes (pt reports that only happens when laying down in the bed and will get choked on saliva) and word finding trouble.  She has an MRI pending for 01/05/16 at [facility].  States that the word finding issues have been gradually coming on for a year and sometimes she will have to describe what is happening.  Admits to tons of stress; her dog of 16 years just died in December 04, 2022 and she just put her house up for sale.   Also states that when she was child she had asthma and her mother brought her to a clinic and they treated her with arsenic and she wonders if this, along with her cancers, were from the treatements that she received.    04/13/16 update:  The patient follows up today.  I have reviewed numerous records made available to me.  Last visit, the patient had choreiform like movements.  She had extensive lab work done.  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 Alvy Bimler.  She felt that she did not have a primary hematologic issue, but did state that since her ANA was increased at 1:80 perhaps she had a nonspecific rheumatologic issue.  The patient did not get her Huntington slams completed.  She had an MRI of  the brain that I had the opportunity to review.  This was done on 02/16/2016.  There was moderate small vessel disease and mild atrophy.  The patient has moved into assisted living since our last visit.  The patient has seen Dr. Gilford Rile many times since our last visit.  Dr. Gilford Rile has been concern for possible motor neuron disease.  No falls since last visit.  States that she is able to negotiate steps at her apartment.   States that she is getting choked a lot and feels that is part of the reason she can't do MRI well because every time she lays back she gets choked.    06/01/16 update:  The patient follows up today.  She is accompanied today by her friends, who supplement the history.   She had an EMG in 05/02/2016 that was suggestive of early motor neuron disease.  She went onto my chart and printed off that EMG and then did research on the internet and that "looked like doom and gloom."    She underwent a modified barium swallow on 05/18/2016 demonstrating mild oral pharyngeal phase dysphagia.  A mechanical soft diet with thin liquids was recommended.  It is recommended that she take her pills with pured foods.  She refused an MRI of the cervical spine.  She has followed up with Dr.  Walker since our last visit.  No further falls.  Is having a lot of cramping.  Asks me about doing water aerobics.  Asks me about cancelling her pulmonary appt.   ALLERGIES:   Allergies  Allergen Reactions  . Prednisone Hives  . Sulfonamide Derivatives Other (See Comments)    Skin whelps  . Meperidine Other (See Comments)    Unspecified  . Ciprofloxacin Diarrhea    REACTION: diarrhea    CURRENT MEDICATIONS:  Outpatient Encounter Prescriptions as of 06/01/2016  Medication Sig  . acyclovir (ZOVIRAX) 800 MG tablet Take 1 tablet (800 mg total) by mouth 2 (two) times daily.  Marland Kitchen aspirin 81 MG tablet Take 81 mg by mouth every other day.   . Estrogens, Conjugated (PREMARIN VA) Place 1 g vaginally once a week.  .  Fluticasone Furoate-Vilanterol (BREO ELLIPTA) 100-25 MCG/INH AEPB Inhale 1 puff into the lungs daily.  . Multiple Vitamin (MULTIVITAMIN) tablet Take 1 tablet by mouth daily.    . NON FORMULARY Ease Takes 1 tablet by mouth daily.  . pantoprazole (PROTONIX) 40 MG tablet Take 1 tablet (40 mg total) by mouth daily.  Marland Kitchen Propylene Glycol (SYSTANE BALANCE OP) 1 drop daily.   . ranitidine (ZANTAC) 300 MG tablet Take 1 tablet (300 mg total) by mouth at bedtime. (Patient taking differently: Take 300 mg by mouth at bedtime as needed. )  . vitamin C (ASCORBIC ACID) 500 MG tablet Take 1 tablet (500 mg total) by mouth daily.  . diazepam (VALIUM) 5 MG tablet Take one tablet 30 minutes prior to MR (on arrival to facility) and one more just prior to MR (if needed)  . tizanidine (ZANAFLEX) 2 MG capsule Take 1 capsule (2 mg total) by mouth 2 (two) times daily.   No facility-administered encounter medications on file as of 06/01/2016.     PAST MEDICAL HISTORY:   Past Medical History:  Diagnosis Date  . Atypical nevi   . Benign neoplasm of skin, site unspecified    Dr. Aubery Lapping  . Colitis   . 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 Disorder of bone and cartilage, unspecified   . Diverticulosis   . Dizziness and giddiness   . Dyspnea on exertion   . Erosive esophagitis   . Esophageal reflux   . Herpes simplex without mention of complication   . Hip fracture ([place]) 11/12   surgery  . Hx-TIA (transient ischemic attack)   . Malignant neoplasm of kidney, except pelvis   . Other malaise and fatigue   . Palpitations   . Palpitations    a. 07/2015 Echo: EF 60-65%, Gr 1 DD, mild AI.  Marland Kitchen Personal history of malignant neoplasm of breast   . Personal history of other malignant neoplasm of skin   . Tobacco abuse   . Ulcerative colitis, unspecified   . Unspecified asthma(493.90)    since childhood  . Vaginal atrophy   . Vaginal stenosis     PAST SURGICAL HISTORY:   Past  Surgical History:  Procedure Laterality Date  . ABDOMINAL HYSTERECTOMY  1986   endometriosis, Dr. Pearletha Furl  . APPENDECTOMY    . BREAST LUMPECTOMY Right 1995   right breast cancer, lumpectomy and XRT and chemo  . COLONOSCOPY  4/10   diverticulosis; recheck 5 years  . ESOPHAGOGASTRODUODENOSCOPY  2002   esophagitis; grade 1, erosive  . HIP FRACTURE SURGERY Right 2005   hemiarthroplasty right, Dr. Francia Greaves  . HIP FRACTURE SURGERY Left 11/12  after fall at church  . MELANOMA EXCISION  1980   Dr. Sharlet Salina  . NEPHRECTOMY  2000   left renal cell carcinoma, Dr. Jacqlyn Larsen    SOCIAL HISTORY:   Social History   Social History  . Marital status: Widowed    Spouse name: N/A  . Number of children: N/A  . Years of education: N/A   Occupational History  . Not on file.   Social History Main Topics  . Smoking status: Former Smoker    Quit date: 08/02/1984  . Smokeless tobacco: Never Used  . Alcohol use 0.0 oz/week     Comment: 1 beer once a month  . Drug use: No  . Sexual activity: Not on file   Other Topics Concern  . Not on file   Social History Narrative   Widowed. Lives in [place].      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 Paternal Grandmother Deceased  . Mother Deceased at age 40   healthy  . Father Deceased   emphysema (smoker)  . Sister Alive   healthy   Pt undressed, placed into examining shorts and no shirt (pt refused gown).  Chaperone present  ROS:  A complete 10 system review of systems was obtained and was unremarkable apart from what is mentioned above.  PHYSICAL EXAMINATION:    VITALS:   Vitals:   06/01/16 1039  BP: (!) 146/80  Pulse: 75  Weight: 132 lb (59.9 kg)  Height: 5' 6"  (1.676 m)    GEN:  The patient appears stated age and is in NAD.  Tearful at times. HEENT:  Normocephalic, atraumatic.  The mucous membranes are moist. The superficial temporal arteries are without  ropiness or tenderness. CV:  RRR Lungs:  CTAB Neck/HEME:  There are no carotid bruits bilaterally. MS:  Very deep and elongated bruise over right hip from fall yesterday.  Neurological examination:  Orientation:  Montreal Cognitive Assessment  04/13/2016  Visuospatial/ Executive (0/5) 5  Naming (0/3) 3  Attention: Read list of digits (0/2) 2  Attention: Read list of letters (0/1) 1  Attention: Serial 7 subtraction starting at 100 (0/3) 3  Language: Repeat phrase (0/2) 2  Language : Fluency (0/1) 0  Abstraction (0/2) 2  Delayed Recall (0/5) 2  Orientation (0/6) 6  Total 26  Adjusted Score (based on education) 26   Cranial nerves: There is good facial symmetry.  The visual fields are full to confrontational testing. The speech is fluent and clear.  No hypophonic speech.  Minimal word finding trouble.  No significant dysphasia. Soft palate rises symmetrically and there is no tongue deviation. Hearing is intact to conversational tone. Sensation: Sensation is intact to light touch throughout Motor: Strength is 5/5 in the bilateral upper and lower extremities. When testing intrinsic mm of the hand, she has cramping and requests that we stop.  Shoulder shrug is equal and symmetric.  There is no pronator drift. Deep tendon reflexes: Deep tendon reflexes are 2+/4 at the bilateral biceps, triceps, brachioradialis, 3/4 at the bilateral patella and 1/4 at the bilateral achilles. Striatal toe is present on the right.  L toe is neutral.    Movement examination: Tone: There is normal tone in the bilateral upper extremities.  The tone in the lower extremities is normal.  Abnormal movements: There is choreiform like movements in the fingers of the right greater than left  upper extremity. There are fasciculations (rare) in the right gastroc, L vastus medialis, bilateral deltoid and multiple forearm mm bilaterally. No tongue fasiculations.    Coordination:  There is only trouble with decremation rapid  alternating movements with toe taps on the left.  All other forms of rapid alternating movements were normal.  Gait and Station: The patient has no difficulty arising out of a deep-seated chair without the use of the hands. The patient's stride length is normal, but there is evidence of chorea in the fingers when she walks.     Chemistry      Component Value Date/Time   NA 141 04/04/2016 0900   NA 139 08/02/2012 0849   K 4.4 04/04/2016 0900   CL 105 04/04/2016 0900   CO2 28 04/04/2016 0900   BUN 24 (H) 04/04/2016 0900   BUN 15 08/02/2012 0849   CREATININE 0.92 04/04/2016 0900      Component Value Date/Time   CALCIUM 9.0 04/04/2016 0900   ALKPHOS 40 03/08/2016 1613   AST 33 03/08/2016 1613   ALT 23 03/08/2016 1613   BILITOT 0.4 03/08/2016 1613     Lab Results  Component Value Date   TSH 1.27 12/16/2015   Lab Results  Component Value Date   ESRSEDRATE 8 12/27/2015     Lab Results  Component Value Date   VITAMINB12 374 12/16/2015     ASSESSMENT/PLAN:  1.  Motor neuron disease  -for completeness sake, still needs MRI c-spine done.  After a long discussion today, was agreeable to have done with valium.  -Long discussion with the patient today.  Discussed future planning.  She really has very little psychosocial support with family but does have friends in her community.  Does live on 4th floor but has elevator.  She met our Education officer, museum today.    -Asked me about pool aerobics and while I don't really have objection right now, I will wait until MRI completed to fill out form.  Not sure how long she will be able to do it.  -asked me about cancelling her pulmonology appt but I don't want her doing that but she can move it back until after her appt with Dr. Posey Pronto  -gave her RX for zanaflex for the cramping.  Will try 2 mg at night and then, if tolerated, can try bid dosing.  Risks, benefits, side effects and alternative therapies were discussed.  The opportunity to ask questions  was given and they were answered to the best of my ability.  The patient expressed understanding and willingness to follow the outlined treatment protocols. 2.  Dysphagia  -Modified barium swallow done on 05/18/2016 demonstrated mild oral pharyngeal phase dysphagia.  Mechanical soft diet with thin liquids recommended.  Meds with puree.  Discussed with her today. 3.  The patient will be following up with my partner, Dr. Posey Pronto, given that this is her area of expertise.

## 2016-06-01 ENCOUNTER — Ambulatory Visit (INDEPENDENT_AMBULATORY_CARE_PROVIDER_SITE_OTHER): Payer: PPO | Admitting: Neurology

## 2016-06-01 ENCOUNTER — Encounter: Payer: Self-pay | Admitting: Neurology

## 2016-06-01 VITALS — BP 146/80 | HR 75 | Ht 66.0 in | Wt 132.0 lb

## 2016-06-01 DIAGNOSIS — R292 Abnormal reflex: Secondary | ICD-10-CM | POA: Diagnosis not present

## 2016-06-01 DIAGNOSIS — W19XXXA Unspecified fall, initial encounter: Secondary | ICD-10-CM

## 2016-06-01 DIAGNOSIS — G1220 Motor neuron disease, unspecified: Secondary | ICD-10-CM

## 2016-06-01 DIAGNOSIS — M542 Cervicalgia: Secondary | ICD-10-CM | POA: Diagnosis not present

## 2016-06-01 MED ORDER — DIAZEPAM 5 MG PO TABS: ORAL_TABLET | ORAL | 0 refills | Status: DC

## 2016-06-01 MED ORDER — TIZANIDINE HCL 2 MG PO CAPS: 2.0000 mg | ORAL_CAPSULE | Freq: Two times a day (BID) | ORAL | 2 refills | Status: DC

## 2016-06-01 NOTE — Patient Instructions (Signed)
1. We have scheduled you at [facility] for your MRI on 06/13/16 at 9:00 am. Please arrive 15 minutes prior and go to radiology. If you need to reschedule for any reason please call [phone].  2. Prescription given for valium. Take one tablet 30 minutes prior to MR (on arrival to facility) and one more just prior to MR (if needed).  3. Start Tizanidine 2 mg tablets to take one at night. If doing well on this dose for one week okay to increase to twice daily.

## 2016-06-01 NOTE — Progress Notes (Signed)
Clinical Social Work Note  CSW received request from MD to introduce self to pt and explain role. Per MD, MD discussed with pt today importance of future planning surrounding living situation as pt has little psychosocial support. Per chart review, pt is widowed and does not have children. Pt accompanied by friends at today's appointment.  CSW met with pt and pt two friends in exam room. CSW introduced self and explained role. Pt expressed that she was confused about if Dr. Carles Collet was recommending that pt needed assisted living right now. CSW clarified that Dr. Carles Collet is encouraging pt to consider future planning for if pt motor neuron disease begins to impact her ability to manage her own care independently.  CSW clarified that main purpose of CSW visit today was to introduce myself and discuss what assistance is available through this CSW in order for pt to know that CSW is available as a resource and to assist with connection to resources if needed. Pt expressed understanding and CSW discussed the benefits of thinking about future planning in advance so that if pt begins to need more assistance it is not a crisis and there is a plan in place. Pt reports that she lives in an independent living community in [place], [place]. Pt shared that she has long term care insurance to assist in the future if needed. Pt accepting of CSW contact information, but no resources identified that would assist pt at this time. CSW encouraged pt to contact CSW if any questions or need for resources arise before next visit.   CSW to remain available to provide support and assist with connection to resources as needed.  Mary Fields, MSW, LCSW Clinical Social Worker [facility] [phone]

## 2016-06-02 ENCOUNTER — Ambulatory Visit (INDEPENDENT_AMBULATORY_CARE_PROVIDER_SITE_OTHER): Payer: PPO | Admitting: Family Medicine

## 2016-06-02 VITALS — BP 142/72 | HR 74 | Temp 98.4°F | Resp 14 | Wt 134.0 lb

## 2016-06-02 DIAGNOSIS — G1220 Motor neuron disease, unspecified: Secondary | ICD-10-CM

## 2016-06-02 DIAGNOSIS — R1312 Dysphagia, oropharyngeal phase: Secondary | ICD-10-CM | POA: Diagnosis not present

## 2016-06-02 NOTE — Progress Notes (Signed)
  Tommi Rumps, MD Phone: [phone]  Mary Fields is a 75 y.o. female who presents today for follow-up.  Recently diagnosed with motor neuron disease through neurology. She notes most of her symptoms are cramps. She was prescribed Zanaflex at her last neurologist visit though has not filled this yet. She also has mild oropharyngeal phase dysphagia for which she is supposed to be on mechanical soft diet with thin liquids. She has not really been eating mechanically soft food and had a steak sometime in the last several days. Has not had any aspiration events. She is supposed to have an MRI of her cervical spine to evaluate further. Notes she was prescribed Valium to help her tolerate the MRI. She asked why people get motor neuron disease.   ROS see history of present illness  Objective  Physical Exam Vitals:   06/02/16 1554  BP: (!) 142/72  Pulse: 74  Resp: 14  Temp: 98.4 F (36.9 C)    BP Readings from Last 3 Encounters:  06/02/16 (!) 142/72  06/01/16 (!) 146/80  05/15/16 132/80   Wt Readings from Last 3 Encounters:  06/02/16 134 lb (60.8 kg)  06/01/16 132 lb (59.9 kg)  05/15/16 134 lb 3.2 oz (60.9 kg)    Physical Exam  Constitutional: No distress.  HENT:  Head: Normocephalic and atraumatic.  Cardiovascular: Normal rate, regular rhythm and normal heart sounds.   Pulmonary/Chest: Effort normal and breath sounds normal.  Neurological: She is alert.  Mild dysarthria otherwise CN 2-12 intact, 5/5 strength in bilateral biceps, triceps, grip, quads, hamstrings, plantar and dorsiflexion, sensation to light touch intact in bilateral UE and LE, normal gait, 2+ patellar reflexes, fasciculations noted in bilateral forearms  Skin: Skin is warm and dry. She is not diaphoretic.     Assessment/Plan: Please see individual problem list.  Motor neuron disease ([place]) Currently being followed by neurology and is getting set up with a new neurologist to further manage this. Has an  upcoming MRI. Encouraged her not to drive to or from the MRI if she is going to take Valium. Given her dysarthria and swallowing issues we will have her see speech-language pathology for further therapy regarding this. Encouraged her to eat soft foods and pured foods and not eat whole foods that may put her at risk for aspiration and pneumonia. Encouraged her to pick up the Zanaflex as well. She'll continue to monitor and follow up with neurology. Additionally advised that we are currently unsure why people get motor neuron diseases. Encouraged her to discuss this further with her neurologist.  Orders Placed This Encounter  Procedures  . Ambulatory referral to Speech Therapy    Referral Priority:   Routine    Referral Type:   Speech Therapy    Referral Reason:   Specialty Services Required    Requested Specialty:   Speech Pathology    Number of Visits Requested:   1    No orders of the defined types were placed in this encounter.   Tommi Rumps, MD [place]

## 2016-06-02 NOTE — Progress Notes (Signed)
Pre visit review using our clinic review tool, if applicable. No additional management support is needed unless otherwise documented below in the visit note. 

## 2016-06-02 NOTE — Patient Instructions (Signed)
Nice to see you. We will refer you to speech pathology for help with your swallowing issues. Please keep your appointment with Dr. Posey Pronto and for the MRI.

## 2016-06-02 NOTE — Assessment & Plan Note (Signed)
Currently being followed by neurology and is getting set up with a new neurologist to further manage this. Has an upcoming MRI. Encouraged her not to drive to or from the MRI if she is going to take Valium. Given her dysarthria and swallowing issues we will have her see speech-language pathology for further therapy regarding this. Encouraged her to eat soft foods and pured foods and not eat whole foods that may put her at risk for aspiration and pneumonia. Encouraged her to pick up the Zanaflex as well. She'll continue to monitor and follow up with neurology.

## 2016-06-09 ENCOUNTER — Other Ambulatory Visit: Payer: Self-pay | Admitting: Obstetrics and Gynecology

## 2016-06-13 ENCOUNTER — Ambulatory Visit
Admission: RE | Admit: 2016-06-13 | Discharge: 2016-06-13 | Disposition: A | Discharge status: Home / Self Care | Payer: PPO | Source: Ambulatory Visit | Attending: Neurology | Admitting: Neurology

## 2016-06-13 DIAGNOSIS — M5021 Other cervical disc displacement,  high cervical region: Secondary | ICD-10-CM | POA: Diagnosis not present

## 2016-06-13 DIAGNOSIS — M542 Cervicalgia: Secondary | ICD-10-CM | POA: Insufficient documentation

## 2016-06-13 DIAGNOSIS — R292 Abnormal reflex: Secondary | ICD-10-CM

## 2016-06-13 DIAGNOSIS — W19XXXA Unspecified fall, initial encounter: Secondary | ICD-10-CM

## 2016-06-13 DIAGNOSIS — M47812 Spondylosis without myelopathy or radiculopathy, cervical region: Secondary | ICD-10-CM | POA: Diagnosis not present

## 2016-06-22 ENCOUNTER — Ambulatory Visit: Payer: PPO | Admitting: Pulmonary Disease

## 2016-07-05 ENCOUNTER — Other Ambulatory Visit (INDEPENDENT_AMBULATORY_CARE_PROVIDER_SITE_OTHER): Payer: PPO

## 2016-07-05 ENCOUNTER — Ambulatory Visit (INDEPENDENT_AMBULATORY_CARE_PROVIDER_SITE_OTHER): Payer: PPO | Admitting: Neurology

## 2016-07-05 ENCOUNTER — Ambulatory Visit: Payer: PPO | Admitting: Speech Pathology

## 2016-07-05 ENCOUNTER — Encounter: Payer: Self-pay | Admitting: Neurology

## 2016-07-05 VITALS — BP 140/88 | HR 82 | Ht 66.0 in | Wt 133.2 lb

## 2016-07-05 DIAGNOSIS — F482 Pseudobulbar affect: Secondary | ICD-10-CM | POA: Diagnosis not present

## 2016-07-05 DIAGNOSIS — G1221 Amyotrophic lateral sclerosis: Secondary | ICD-10-CM | POA: Diagnosis not present

## 2016-07-05 LAB — C-REACTIVE PROTEIN: CRP: 0.1 mg/dL — AB (ref 0.5–20.0)

## 2016-07-05 LAB — CK: Total CK: 88 U/L (ref 7–177)

## 2016-07-05 NOTE — Patient Instructions (Addendum)
1.  Start Neudexta 1 tablet daily for a week, then increase to 1 tablet twice daily 2.  Check blood work 3.  Start physical therapy for arm and leg strengthening  4.  Start drinking 1-3 glasses of tonic water nightly for cramps 5.  Follow-up with Dr. Jamal Collin for shortness of breath  Return to clinic in 3 months

## 2016-07-05 NOTE — Progress Notes (Signed)
Note routed

## 2016-07-05 NOTE — Progress Notes (Signed)
[facility] Clinic Note - Initial Visit   Date: 07/05/16  Mary Fields MRN: [id number] DOB: 1941/01/20   Dear Dr. Caryl Bis:  Thank you for your kind referral of Mary Fields for consultation of motor neuron disease. Although her history is well known to you, please allow [place] to reiterate it for the purpose of our medical record. The patient was accompanied to the clinic by friend who also provides collateral information.     History of Present Illness: Mary Fields is a 75 y.o. right-handed Caucasian female with GERD and history of melanoma, breast cancer and kidney cancer.   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    Starting around early 2017, she began noticing speech changes and she was unable to sing. She was singing in a choir for 45+ years and then suddenly was unable to vocalize higher pitches.  Her voice has become deeper and slower, making speech production harder.  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  She also complains of new shortness of breath over the same time.  She is seeing Mary Fields, pulmonologist.  She went to see ENT whose evaluation was normal and then referred to my collegue, Mary Fields.  She underwent extensive neurological testing including MRI brain, MRI cervical spine, NCS/EMG and MBS.  Her MRI did not show any structural disease or compressive myelopathy to explain symptoms.  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  She denies having problem with swallowing, but she has MBS which showed mild oropharyngeal dysphasia and recommended to take pulls with puree.  Since doing this, her pills are a lot easier to swallow.  She has been referred to me for further  evaluation.  She also has painful cramps of the hands and calf.  She has tried tizanidine 13m at bedtime, but has not noticed a significant change.  She has some twiches over the legs.   She has difficulty with grip, such as hanging her pants and trying to manipulate the clip.  She has a jar opener which helps with opening bottles and jars.  She has no weakness of the legs and is able to climb 4 flights of stairs without getting tired.   There are spells of emotional lability especially when she sees commercials or hears beautiful classical music.  At church, she cries very easily at the music and it causes her social embarrassment.   Out-side paper records, electronic medical record, and images have been reviewed where available and summarized as:  MRI cervical spine wo contrast 06/13/2016:  No finding to explain the patient's symptoms. The cervical cord appears normal. Mild multilevel spondylosis without central canal narrowing is identified. There is mild to moderate foraminal narrowing at C5-6, worse on the left.  MRI brain wwo contrast 02/16/2016:  No acute or reversible finding. Mild generalized brain atrophy with mild to moderate chronic small-vessel ischemic changes of the cerebral hemispheric white matter, slightly progressive since 2007.  MBS 05/18/2016:  Mild oral phase dysphasia and reduced lingual coordination and control of liquid boluses  NCS/EMG of the right side 05/02/2016:  In summary, the above findings are consistent with active on chronic cervical intraspinal lesions affecting the right C8-T1 roots/segments, which is mild in degree  electrically. 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  Clinical correlation recommended.  Labs 12/16/2015:  TSH 1.27, vitamin B12 374, ANA positive, ceruloplasmin  23, ferritin 168, celiac panel negative, ESR 8, RPR NR, heavy metal screen   Past Medical History:  Diagnosis Date  . Atypical nevi   . Benign neoplasm of skin, site unspecified    Dr. Aubery Lapping  . Colitis   . 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 Disorder of bone and cartilage, unspecified   . Diverticulosis   . Dizziness and giddiness   . Dyspnea on exertion   . Erosive esophagitis   . Esophageal reflux   . Herpes simplex without mention of complication   . Hip fracture ([place]) 11/12   surgery  . Hx-TIA (transient ischemic attack)   . Malignant neoplasm of kidney, except pelvis   . Other malaise and fatigue   . Palpitations   . Palpitations    a. 07/2015 Echo: EF 60-65%, Gr 1 DD, mild AI.  Marland Kitchen Personal history of malignant neoplasm of breast   . Personal history of other malignant neoplasm of skin   . Tobacco abuse   . Ulcerative colitis, unspecified   . Unspecified asthma(493.90)    since childhood  . Vaginal atrophy   . Vaginal stenosis     Past Surgical History:  Procedure Laterality Date  . ABDOMINAL HYSTERECTOMY  1986   endometriosis, Dr. Pearletha Furl  . APPENDECTOMY    . BREAST LUMPECTOMY Right 1995   right breast cancer, lumpectomy and XRT and chemo  . COLONOSCOPY  4/10   diverticulosis; recheck 5 years  . ESOPHAGOGASTRODUODENOSCOPY  2002   esophagitis; grade 1, erosive  . HIP FRACTURE SURGERY Right 2005   hemiarthroplasty right, Dr. Francia Greaves  . HIP FRACTURE SURGERY Left 11/12   after fall at church  . MELANOMA EXCISION  1980   Dr. Sharlet Salina  . NEPHRECTOMY  2000   left renal cell carcinoma, Dr. Jacqlyn Larsen     Medications:  Outpatient Encounter Prescriptions as of 07/05/2016  Medication Sig Note  . acyclovir (ZOVIRAX) 800 MG tablet Take 1 tablet (800 mg total) by mouth 2 (two) times daily.   Marland Kitchen aspirin 81 MG tablet Take 81 mg by mouth every other day.    . diazepam (VALIUM) 5 MG tablet Take one tablet 30 minutes prior to MR (on  arrival to facility) and one more just prior to MR (if needed)   . Estrogens, Conjugated (PREMARIN VA) Place 1 g vaginally once a week.   . Fluticasone Furoate-Vilanterol (BREO ELLIPTA) 100-25 MCG/INH AEPB Inhale 1 puff into the lungs daily.   . Multiple Vitamin (MULTIVITAMIN) tablet Take 1 tablet by mouth daily.     . NON FORMULARY Ease Takes 1 tablet by mouth daily.   . pantoprazole (PROTONIX) 40 MG tablet Take 1 tablet (40 mg total) by mouth daily.   Marland Kitchen PREMARIN vaginal cream PLACE 1/2GM VAGINALLY TWICE A WEEK   . Propylene Glycol (SYSTANE BALANCE OP) 1 drop daily.    . ranitidine (ZANTAC) 300 MG tablet Take 1 tablet (300 mg total) by mouth at bedtime. (Patient taking differently: Take 300 mg by mouth at bedtime as needed. )   . tizanidine (ZANAFLEX) 2 MG capsule Take 1 capsule (2 mg total) by mouth 2 (two) times daily.   Marland Kitchen  vitamin C (ASCORBIC ACID) 500 MG tablet Take 1 tablet (500 mg total) by mouth daily.   . [DISCONTINUED] tiZANidine (ZANAFLEX) 2 MG tablet  07/05/2016: Received from: External Pharmacy   No facility-administered encounter medications on file as of 07/05/2016.      Allergies:  Allergies  Allergen Reactions  . Prednisone Hives  . Sulfonamide Derivatives Other (See Comments)    Skin whelps  . Meperidine Other (See Comments)    Unspecified  . Ciprofloxacin Diarrhea    REACTION: diarrhea    Family History: Family History  Problem Relation Age of Onset  . Heart disease Paternal Grandmother   . Hypertension Paternal Grandmother   . Stomach cancer Maternal Grandmother   . Kidney disease Maternal Uncle   . Cancer Paternal Aunt     breast cancer    Social History: Social History  Substance Use Topics  . Smoking status: Former Smoker    Quit date: 08/02/1984  . Smokeless tobacco: Never Used  . Alcohol use 0.0 oz/week     Comment: 1 beer once a month   Social History   Social History Narrative   Widowed. Lives in [place].      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   EYES: No visual changes or eye pain ENT: No hearing changes.  No history of nose bleeds.   RESPIRATORY: No cough, wheezing and shortness of breath.   CARDIOVASCULAR: Negative for chest pain, and palpitations.   GI: Negative for abdominal discomfort, blood in stools or black stools.  No recent change in bowel habits.   GU:  No history of incontinence.   MUSCLOSKELETAL: No history of joint pain or swelling.  No myalgias.   SKIN: Negative for lesions, rash, and itching.   HEMATOLOGY/ONCOLOGY: Negative for prolonged bleeding, bruising easily, and swollen nodes.  No history of cancer.   ENDOCRINE: Negative for cold or heat intolerance, polydipsia or goiter.   PSYCH:  No depression or anxiety symptoms.   NEURO: As Above.   Vital Signs:  BP 140/88   Pulse 82   Ht _0  (1.676 m)   Wt 133 lb 4 oz (60.4 kg)   SpO2 98%   BMI 21.51 kg/m  Pain Scale: 0 on a scale of 0-10   General Medical Exam:   General:  Well appearing, comfortable, several spells of crying when she would talk about her dog or church Eyes/ENT: see cranial nerve examination.   Neck: No masses appreciated.  Full range of motion without tenderness.  No carotid bruits. Respiratory:  Clear to auscultation, good air entry bilaterally.   Cardiac:  Regular rate and rhythm, no murmur.   Extremities:  No deformities, edema, or skin discoloration.  Skin:  No rashes or lesions.  Neurological Exam: MENTAL STATUS including orientation to time, place, person, recent and remote memory, attention span and concentration, language, and fund of knowledge is normal.  Speech is not dysarthric.  CRANIAL NERVES: II:  No visual field defects.  Unremarkable fundi.   III-IV-VI: Pupils equal round and reactive to light.  Normal conjugate, extra-ocular eye movements in all directions of gaze.  No nystagmus.  No ptosis.   V:   Normal facial sensation.  Jaw jerk is present.   VII:  Normal facial symmetry and movements.  Snout and Myerson's is positive.  Negative palmomental reflexes.   Buccinator muscles are  weak.  Active fasciculations seen over the mentalis and orbicularis oris muscles.  VIII:  Normal hearing and vestibular function.   IX-X:  Normal palatal movement.   XI:  Normal shoulder shrug and head rotation.   XII:  Mild weakness with tongue strength and there are active fasciculations.  MOTOR: There is moderate atrophy of the intrinsic hand muscles.  Active fasciculations are seen over the biceps, triceps, forearm muscles, rare fasciculations are present in the lower extremities.  No pronator drift.  Tone is normal.    Right Upper Extremity:    Left Upper Extremity:    Deltoid  4/5   Deltoid  4/5   Biceps  5/5   Biceps  5/5   Triceps  5-/5   Triceps  4+/5   Wrist extensors  5-/5   Wrist extensors  5-/5   Wrist flexors  5-/5   Wrist flexors  5-/5   Finger extensors  5/5   Finger extensors  5/5   Finger flexors  5/5   Finger flexors  5/5   Dorsal interossei  4/5   Dorsal interossei  4/5   Abductor pollicis  4/5   Abductor pollicis  4/5   Tone (Ashworth scale)  0  Tone (Ashworth scale)  0   Right Lower Extremity:    Left Lower Extremity:    Hip flexors  4/5   Hip flexors  4/5   Hip extensors  5/5   Hip extensors  5/5   Knee flexors  5/5   Knee flexors  5/5   Knee extensors  5/5   Knee extensors  5/5   Dorsiflexors  5/5   Dorsiflexors  5/5   Plantarflexors  5/5   Plantarflexors  5/5   Toe extensors  5/5   Toe extensors  5/5   Toe flexors  5/5   Toe flexors  5/5   Tone (Ashworth scale)  0  Tone (Ashworth scale)  0   MSRs:  Right                                                                 Left brachioradialis 3+  brachioradialis 3+  biceps 3+  biceps 3+  triceps 2+  triceps 2+  patellar 3+  patellar 3+  ankle jerk 1+  ankle jerk 1+  Hoffman no  Hoffman no  plantar response down  plantar  response down   SENSORY:  Normal and symmetric perception of light touch, pinprick, vibration, and proprioception.  Romberg's sign absent.   COORDINATION/GAIT: Normal finger-to- nose-finger and heel-to-shin.  Intact rapid alternating movements bilaterally.  Able to rise from a chair without using arms.  Gait narrow based and stable. Tandem gait intact.  She has mild unsteadiness with stressed gait.   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 Her neurological examination is notable for lower and upper motor neuron findings involving the bulbar, cervical, and lumbosacral regions. I have personally reviewed imaging of the brain and cervical spine which does not show any cause to explain her symptoms.  Electrodiagnostic testing was performed in July by myself which shows active changes in the bulbar, cervical, and lumbosacral regions with chronic axon motor loss  isolated to the cervical region.  I suspect that if her NCS/EMG was repeated now, there would be greater degree of motor axon loss as her disease has progressed.    Although she has already had serology testing looking for external injury to the motor nerves, ther are still a few remaining.  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   I had extensive discussion regarding the pathophysiology, etiology, and management of motor neuron disease with patient.  Unfortunately, there are no curative treatment and management is mostly symptom control. We discussed starting medications for ALS, including rilutek and newly approved Radicava, but after discussing the overall benefit, she does not wish to start either of these medications.  She does not wish to seek a second opinion at [facility] or be evaluated in a multidisciplinary ALS clinic.     PLAN/RECOMMENDATIONS:  1.  Check SPEP/UPEP with IFE, CK, aldolase, CRP, copper 2.  Regarding her shortness of breath, I will request her to have PFTs with her pulmonologist. 3.  For her muscle cramps, she will increase tizanidine to 80m BID and also try tonic water at bedtime 4.  For pseudobulbar affect, I will give her samples of Neudexta with instructions to titrate to 1 tablet BID after one week. 5.  Physical therapy will be started for her generalized weakness.  Return to clinic in 3 months.   The duration of this appointment visit was 65 minutes of face-to-face time with the patient.  Greater than 50% of this time was spent in counseling, explanation of diagnosis, planning of further management, and coordination of care.   Thank you for allowing me to participate in patient's care.  If I can answer any additional questions, I would be pleased to Fields so.    Sincerely,    Mary Fields

## 2016-07-07 LAB — PROTEIN ELECTROPHORESIS, SERUM
ALBUMIN ELP: 3.9 g/dL (ref 3.8–4.8)
ALPHA-1-GLOBULIN: 0.3 g/dL (ref 0.2–0.3)
ALPHA-2-GLOBULIN: 0.8 g/dL (ref 0.5–0.9)
Beta 2: 0.3 g/dL (ref 0.2–0.5)
Beta Globulin: 0.4 g/dL (ref 0.4–0.6)
Gamma Globulin: 1.3 g/dL (ref 0.8–1.7)
TOTAL PROTEIN, SERUM ELECTROPHOR: 7 g/dL (ref 6.1–8.1)

## 2016-07-07 LAB — IMMUNOFIXATION ELECTROPHORESIS
IGA: 177 mg/dL (ref 81–463)
IgG (Immunoglobin G), Serum: 1222 mg/dL (ref 694–1618)
IgM, Serum: 203 mg/dL (ref 48–271)

## 2016-07-07 LAB — PROTEIN ELECTROPHORESIS,RANDOM URN
CREATININE, URINE: 95 mg/dL (ref 20–320)
Protein Creatinine Ratio: 95 mg/g creat (ref 21–161)
Total Protein, Urine: 9 mg/dL (ref 5–24)

## 2016-07-07 LAB — IMMUNOFIXATION INTE

## 2016-07-07 LAB — COPPER, SERUM: Copper: 102 ug/dL (ref 72–166)

## 2016-07-07 LAB — ALDOLASE: Aldolase: 4.1 U/L (ref <=8.1)

## 2016-07-10 ENCOUNTER — Encounter: Payer: Self-pay | Admitting: *Deleted

## 2016-07-10 ENCOUNTER — Encounter: Payer: Self-pay | Admitting: Family Medicine

## 2016-07-11 ENCOUNTER — Telehealth: Payer: Self-pay

## 2016-07-11 NOTE — Telephone Encounter (Signed)
Please make sure patient is contacted for appt in the [place] office. Thanks   ----- Message -----  From: Cori Razor  Sent: 07/06/2016 10:10 AM  To: [facility] Front Desk Pool  Subject: Appointment Request                 Appointment Request From: Vesta Mixer. Mago    With Provider: Wilhelmina Mcardle, MD [facility] [place]]    Preferred Date Range: Any date 07/06/2016 or later    Preferred Times: Any    Reason for visit: Office Visit    Comments:    LMOVM Will await call back.

## 2016-07-11 NOTE — Telephone Encounter (Signed)
LM x 2

## 2016-07-12 ENCOUNTER — Encounter: Payer: Self-pay | Admitting: Neurology

## 2016-07-12 NOTE — Telephone Encounter (Signed)
Emailed an appt back to pt and informed if appt date and time doesn't work to call the office. Nothing further needed.

## 2016-07-18 ENCOUNTER — Ambulatory Visit: Payer: PPO | Admitting: Pulmonary Disease

## 2016-07-19 ENCOUNTER — Encounter: Payer: Self-pay | Admitting: Neurology

## 2016-07-19 ENCOUNTER — Ambulatory Visit (INDEPENDENT_AMBULATORY_CARE_PROVIDER_SITE_OTHER): Payer: PPO | Admitting: Neurology

## 2016-07-19 VITALS — BP 140/80 | HR 83 | Ht 66.0 in | Wt 134.4 lb

## 2016-07-19 DIAGNOSIS — G1221 Amyotrophic lateral sclerosis: Secondary | ICD-10-CM

## 2016-07-19 DIAGNOSIS — F482 Pseudobulbar affect: Secondary | ICD-10-CM | POA: Diagnosis not present

## 2016-07-19 MED ORDER — DEXTROMETHORPHAN-QUINIDINE 20-10 MG PO CAPS
20.0000 mg | ORAL_CAPSULE | Freq: Two times a day (BID) | ORAL | 11 refills | Status: AC
Start: 2016-07-19 — End: ?

## 2016-07-19 MED ORDER — RILUZOLE 50 MG PO TABS: ORAL_TABLET | ORAL | 11 refills | Status: DC

## 2016-07-19 NOTE — Progress Notes (Signed)
Follow-up Visit   Date: 07/19/16   Mary Fields MRN: [id number] DOB: 1941/09/12   Interim History: Mary Fields is a 75 y.o. right-handed Caucasian female with GERD and history of melanoma, breast cancer and kidney cancer returning to the clinic for follow-up of amyotrophic lateral sclerosis.  The patient was accompanied to the clinic by friend who also provides collateral information.    History of present illness: Starting around early 2017, she began noticing speech changes and she was unable to sing. She was singing in a choir for 45+ years and then suddenly was unable to vocalize higher pitches.  Her voice has become deeper and slower, making speech production harder.  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  She also complains of new shortness of breath over the same time.  She is seeing Dr. Jamal Collin, pulmonologist.  She went to see ENT whose evaluation was normal and then referred to my collegue, Dr. Carles Collet.  She underwent extensive neurological testing including MRI brain, MRI cervical spine, NCS/EMG and MBS.  Her MRI did not show any structural disease or compressive myelopathy to explain symptoms.  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  She denies having problem with swallowing, but she has MBS which showed mild oropharyngeal dysphasia and recommended to take pulls with puree.  Since doing this, her pills are a lot easier to swallow.  She has been referred to me for further evaluation.  She also has painful cramps of the hands and calf.  She has tried tizanidine 75m at bedtime, but has not noticed a significant change.  She has some twiches over the legs.   She has difficulty with grip, such as hanging her pants and  trying to manipulate the clip.  She has a jar opener which helps with opening bottles and jars.  She has no weakness of the legs and is able to climb 4 flights of stairs without getting tired.   There are spells of emotional lability especially when she sees commercials or hears beautiful classical music.  At church, she cries very easily at the music and it causes her social embarrassment.   UPDATE 07/19/2016:  Patient presents for sooner appointment to discuss questions regarding her diagnosis of ALS, prognosis, and medications.  She has noticed significant benefit with Nuedexta, but is not complaint with taking it daily only 1 tablet as needed; in fact, she was able to go to church and did not cry.  She would like to start Rilutek and had many questions about this medication.  She is worried about the progression of her disease and states that she would like to stay in her home for as long as possible.  I reassured her that we can arrange for help at home, when the need comes.  She has read about research with ALS and stem cell transplant and was inquiring whether we do it here, which we do not.  [facility]  Medications:  Current Outpatient Prescriptions on File Prior to Visit  Medication Sig Dispense Refill  . acyclovir (ZOVIRAX) 800 MG tablet Take 1 tablet (800 mg total) by mouth 2 (two) times daily. 10 tablet 3  . aspirin 81 MG tablet Take  81 mg by mouth every other day.     . diazepam (VALIUM) 5 MG tablet Take one tablet 30 minutes prior to MR (on arrival to facility) and one more just prior to MR (if needed) 2 tablet 0  . Estrogens, Conjugated (PREMARIN VA) Place 1 g vaginally once a week.    . Fluticasone Furoate-Vilanterol (BREO ELLIPTA) 100-25 MCG/INH AEPB Inhale 1 puff into the lungs daily. 1 each 11  . Multiple Vitamin (MULTIVITAMIN) tablet Take 1 tablet by mouth daily.      . NON FORMULARY Ease Takes 1 tablet by mouth daily.    . pantoprazole (PROTONIX) 40  MG tablet Take 1 tablet (40 mg total) by mouth daily. 90 tablet 3  . PREMARIN vaginal cream PLACE 1/2GM VAGINALLY TWICE A WEEK 30 g 1  . Propylene Glycol (SYSTANE BALANCE OP) 1 drop daily.     . ranitidine (ZANTAC) 300 MG tablet Take 1 tablet (300 mg total) by mouth at bedtime. (Patient taking differently: Take 300 mg by mouth at bedtime as needed. ) 30 tablet 6  . tizanidine (ZANAFLEX) 2 MG capsule Take 1 capsule (2 mg total) by mouth 2 (two) times daily. 60 capsule 2  . vitamin C (ASCORBIC ACID) 500 MG tablet Take 1 tablet (500 mg total) by mouth daily. 30 tablet 0   No current facility-administered medications on file prior to visit.     Allergies:  Allergies  Allergen Reactions  . Prednisone Hives  . Sulfonamide Derivatives Other (See Comments)    Skin whelps  . Meperidine Other (See Comments)    Unspecified  . Ciprofloxacin Diarrhea    REACTION: diarrhea    Review of Systems:  CONSTITUTIONAL: No fevers, chills, night sweats, or weight loss.  EYES: No visual changes or eye pain ENT: No hearing changes.  No history of nose bleeds.   RESPIRATORY: No cough, wheezing and shortness of breath.   CARDIOVASCULAR: Negative for chest pain, and palpitations.   GI: Negative for abdominal discomfort, blood in stools or black stools.  No recent change in bowel habits.   GU:  No history of incontinence.   MUSCLOSKELETAL: No history of joint pain or swelling.  No myalgias.   SKIN: Negative for lesions, rash, and itching.   ENDOCRINE: Negative for cold or heat intolerance, polydipsia or goiter.   PSYCH:  No depression or anxiety symptoms.   NEURO: As Above.   Vital Signs:  BP 140/80   Pulse 83   Ht _0  (1.676 m)   Wt 134 lb 7 oz (61 kg)   SpO2 95%   BMI 21.70 kg/m   Neurological Exam: MENTAL STATUS including orientation to time, place, person, recent and remote memory, attention span and concentration, language, and fund of knowledge is normal.  Speech is not  dysarthric.  CRANIAL NERVES:  Pupils equal round and reactive to light.  Normal conjugate, extra-ocular eye movements in all directions of gaze.  No ptosis. Face is symmetric, fasciculations present over the mentalis and orbicularis oris muscles. Palate elevates symmetrically.  Tongue is midline and there are active fasciculations.  Jaw jerk, Myerson's sign, and Snout is present.    MOTOR: There is moderate atrophy of the intrinsic hand muscles.  Active fasciculations are seen over the biceps, triceps, forearm muscles, rare fasciculations are present in the lower extremities.  No pronator drift.  Tone is normal.   Neck flexion is 5-/5 Right Upper Extremity:    Left Upper Extremity:    Deltoid  4/5  Deltoid  4/5   Biceps  5/5   Biceps  5/5   Triceps  5-/5   Triceps  4+/5   Wrist extensors  5-/5   Wrist extensors  5-/5   Wrist flexors  5-/5   Wrist flexors  5-/5   Finger extensors  5/5   Finger extensors  5/5   Finger flexors  5/5   Finger flexors  5/5   Dorsal interossei  4/5   Dorsal interossei  4/5   Abductor pollicis  4/5   Abductor pollicis  4/5   Tone (Ashworth scale)  0  Tone (Ashworth scale)  0   Right Lower Extremity:    Left Lower Extremity:    Hip flexors  4/5   Hip flexors  4/5   Hip extensors  5/5   Hip extensors  5/5   Knee flexors  5/5   Knee flexors  5/5   Knee extensors  5/5   Knee extensors  5/5   Dorsiflexors  5/5   Dorsiflexors  5/5   Plantarflexors  5/5   Plantarflexors  5/5   Toe extensors  5/5   Toe extensors  5/5   Toe flexors  5/5   Toe flexors  5/5   Tone (Ashworth scale)  0  Tone (Ashworth scale)  0   MSRs:  Right                                                                 Left brachioradialis 3+  brachioradialis 3+  biceps 3+  biceps 3+  triceps 2+  triceps 2+  patellar 3+  patellar 3+  ankle jerk 1+  ankle jerk 1+  Hoffman no  Hoffman no  plantar response down  plantar response down   COORDINATION/GAIT: Able to rise  from a chair without using arms.  Gait narrow based and stable.   Data: MRI cervical spine wo contrast 06/13/2016:  No finding to explain the patient's symptoms. The cervical cord appears normal. Mild multilevel spondylosis without central canal narrowing is identified. There is mild to moderate foraminal narrowing at C5-6, worse on the left.  MRI brain wwo contrast 02/16/2016:  No acute or reversible finding. Mild generalized brain atrophy with mild to moderate chronic small-vessel ischemic changes of the cerebral hemispheric white matter, slightly progressive since 2007.  MBS 05/18/2016:  Mild oral phase dysphasia and reduced lingual coordination and control of liquid boluses  NCS/EMG of the right side 05/02/2016:  In summary, the above findings are consistent with active on chronic cervical intraspinal lesions affecting the right C8-T1 roots/segments, which is mildin degree electrically. 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 Clinical correlation recommended.  Labs 12/16/2015:  TSH 1.27, vitamin B12 374, ANA positive, ceruloplasmin 23, ferritin 168, celiac panel negative, ESR 8, RPR NR, heavy metal screen  Labs 07/05/2016:  SPEP/UPEP with IFE no M protein, CK 88, aldolase 4.1, CRP 0.1, copper 102   IMPRESSION: Ms. Barletta is a 75 year-old female returning with motor neuron disease manifesting with predominately bulbar symptoms early in 2017. Her neurological examination is notable for lower and upper motor  neuron findings involving the bulbar, cervical, and lumbosacral regions. I have personally reviewed imaging of the brain and cervical spine which does not show any cause to explain her symptoms.  Electrodiagnostic testing was performed in July by myself which shows active changes in the bulbar,  cervical, and lumbosacral regions with chronic axon motor loss isolated to the cervical region.  I suspect that if her NCS/EMG was repeated now, there would be greater degree of motor axon loss as her disease has progressed.   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  We discussed medication for management including riluzole as well as newly approved Radicava.  She is interested in start riluzole today.  I also offered her a referral to [facility] to be involved in clinical trials, but she would like to think about this.  I answered all of her questions to the best of my ability and reassured her that when the time comes, we will do everything to keep her at home comfortably with home health and hospice as her needs change.  PLAN/RECOMMENDATIONS:  1.  Start riluzole 44m daily for one week, then increase to 1 tablet twice daily.  Side effects discussed.  No renal adjustment needed.  Check CMP monthly x 3 months, then every 3 months 2.  For PBA, continue Nuedexta 20/140m1 tablet BID, although she wishes to takes 1 tablet daily 3.  For muscle cramps, continue tonic water and tizanidine 40m26mt bedtime 4.  Follow-up with Dr. SimJamal Collinr PFTs and questions related to her asthma  Return to clinic in 2 months  The duration of this appointment visit was 40 minutes of face-to-face time with the patient.  Greater than 50% of this time was spent in counseling, explanation of diagnosis, planning of further management, and coordination of care.   Thank you for allowing me to participate in patient's care.  If I can answer any additional questions, I would be pleased to do so.    Sincerely,    Kaliope Quinonez K. PatPosey ProntoO

## 2016-07-19 NOTE — Patient Instructions (Addendum)
1.  Start at riluzole 50mg  one tablet daily for one week, then increase to 1 tablet twice daily.  2. We will check your blood work once per month for the first three months to be sure your liver enzymes are not affected 3.  Continue Nuedexta 1 tablet twice daily 4.  We will send a referral to Dr. Gilberto Better for pulmonary function studies 5.  Please let me know if you would like see [facility]

## 2016-07-20 ENCOUNTER — Telehealth: Payer: Self-pay | Admitting: Neurology

## 2016-07-20 ENCOUNTER — Encounter: Payer: Self-pay | Admitting: *Deleted

## 2016-07-20 ENCOUNTER — Encounter: Payer: Self-pay | Admitting: Neurology

## 2016-07-20 ENCOUNTER — Other Ambulatory Visit: Payer: Self-pay | Admitting: *Deleted

## 2016-07-20 DIAGNOSIS — Z79899 Other long term (current) drug therapy: Secondary | ICD-10-CM

## 2016-07-20 NOTE — Telephone Encounter (Signed)
Will send referral

## 2016-07-20 NOTE — Telephone Encounter (Signed)
Mary Fields 03/21/1941. She was calling to let Dr. Posey Pronto know that she would like to have the referral to [facility]. She is in a meeting from 2-3. Her # is [phone]. Thank you

## 2016-07-20 NOTE — Progress Notes (Signed)
Orders placed.  Patient notified via My Chart.

## 2016-07-20 NOTE — Telephone Encounter (Signed)
Please send referral to [facility] for ALS.  Donika K. Posey Pronto, DO

## 2016-07-24 ENCOUNTER — Encounter: Payer: Self-pay | Admitting: Neurology

## 2016-07-24 ENCOUNTER — Ambulatory Visit: Payer: PPO | Attending: Neurology | Admitting: Physical Therapy

## 2016-07-24 ENCOUNTER — Encounter: Payer: Self-pay | Admitting: Physical Therapy

## 2016-07-24 DIAGNOSIS — M6281 Muscle weakness (generalized): Secondary | ICD-10-CM | POA: Diagnosis not present

## 2016-07-24 NOTE — Therapy (Signed)
[place] MAIN [facility] SERVICES [address] [place], [place], [address] Phone: [phone]   Fax:  [phone]  Physical Therapy Evaluation  Patient Details  Name: Mary Fields MRN: [id number] Date of Birth: 19-Sep-1941 Referring Provider: Alda Berthold   Encounter Date: 07/24/2016      PT End of Session - 07/24/16 1313    Visit Number 1   Number of Visits 17   Date for PT Re-Evaluation 08/28/16   PT Start Time 1300   PT Stop Time 1400   PT Time Calculation (min) 60 min   Equipment Utilized During Treatment Gait belt   Activity Tolerance Patient tolerated treatment well      Past Medical History:  Diagnosis Date  . ALS (amyotrophic lateral sclerosis) ([place])   . Atypical nevi   . Benign neoplasm of skin, site unspecified    Dr. Aubery Lapping  . Colitis   . 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 Disorder of bone and cartilage, unspecified   . Diverticulosis   . Dizziness and giddiness   . Dyspnea on exertion   . Erosive esophagitis   . Esophageal reflux   . Herpes simplex without mention of complication   . Hip fracture ([place]) 11/12   surgery  . Hx-TIA (transient ischemic attack)   . Malignant neoplasm of kidney, except pelvis   . Motor neuron disease ([place])   . Other malaise and fatigue   . Palpitations   . Palpitations    a. 07/2015 Echo: EF 60-65%, Gr 1 DD, mild AI.  Marland Kitchen Personal history of malignant neoplasm of breast   . Personal history of other malignant neoplasm of skin   . Tobacco abuse   . Ulcerative colitis, unspecified   . Unspecified asthma(493.90)    since childhood  . Vaginal atrophy   . Vaginal stenosis     Past Surgical History:  Procedure Laterality Date  . ABDOMINAL HYSTERECTOMY  1986   endometriosis, Dr. Pearletha Furl  . APPENDECTOMY    . BREAST LUMPECTOMY Right 1995   right breast cancer, lumpectomy and XRT and chemo  . COLONOSCOPY  4/10   diverticulosis; recheck 5  years  . ESOPHAGOGASTRODUODENOSCOPY  2002   esophagitis; grade 1, erosive  . HIP FRACTURE SURGERY Right 2005   hemiarthroplasty right, Dr. Francia Greaves  . HIP FRACTURE SURGERY Left 11/12   after fall at church  . MELANOMA EXCISION  1980   Dr. Sharlet Salina  . NEPHRECTOMY  2000   left renal cell carcinoma, Dr. Jacqlyn Larsen    There were no vitals filed for this visit.       Subjective Assessment - 07/24/16 1309    Subjective Patient is being referred to PT for strengthening but she does not feel that she has any needs except that she is SOB and also has trouble with vacuuming and she is short of breath. She says that she can climb up 4 flights of steps without any problems.   How long can you sit comfortably? unlimited   How long can you stand comfortably? unlimited   Currently in Pain? No/denies            [facility] PT Assessment - 07/24/16 0001      Assessment   Medical Diagnosis neuro evaluation    Referring Provider Narda Amber K    Onset Date/Surgical Date 07/09/16   Hand Dominance Right   Next MD Visit 09/20/16     Precautions  Precautions None     Restrictions   Weight Bearing Restrictions No     Balance Screen   Has the patient fallen in the past 6 months No   Has the patient had a decrease in activity level because of a fear of falling?  Yes   Is the patient reluctant to leave their home because of a fear of falling?  No     Home Social worker Private residence   Living Arrangements Alone   Available Help at Discharge Friend(s)   Type of [place] Access Level entry;Elevator   Home Layout One level   [place] - 2 wheels;Kasandra Knudsen - single point     Prior Function   Level of Independence Independent   Vocation Retired   Leisure go to the movies     Cognition   Overall Cognitive Status Within Functional Limits for tasks assessed   Attention Focused       PAIN: no pain  POSTURE: WFL   PROM/AROM: WNL Grip strength RUE  10 lbs, LUE 12 lbs  STRENGTH:  Graded on a 0-5 scale Muscle Group Left Right  Shoulder flex 3+/5 3+/5  Shoulder Abd 3+/5 3+/5  Shoulder Ext 3/5 3/5  Shoulder IR/ER    Elbow 4/5 4/5  Wrist/hand 3+/5 3+/5  Hip Flex 3+/5 3+/5  Hip Abd 2/5 2/5  Hip Add 2/5 2/5  Hip Ext    Hip IR/ER    Knee Flex 5/5 5/5  Knee Ext 5/5 5/5  Ankle DF 4/5 4/5  Ankle PF     SENSATION:BLE numbness and tingling      FUNCTIONAL MOBILITY: independently with all mobility   BALANCE: unable to tandem stand without UE support , unable to single leg stand for longer than 3 seconds.  GAIT: Patient ambulates without AD  OUTCOME MEASURES: TEST Outcome Interpretation  5 times sit<>stand 11sec >77 yo, >15 sec indicates increased risk for falls  10 meter walk test      1.4            m/s <1.0 m/s indicates increased risk for falls; limited community ambulator  Timed up and Go    12          sec <14 sec indicates increased risk for falls  6 minute walk test  1000              Feet 1000 feet is community ambulator                                 PT Education - 07/24/16 1312    Education provided Yes   Education Details plan of care   Person(s) Educated Patient   Methods Explanation   Comprehension Verbalized understanding             PT Long Term Goals - 07/24/16 1437      PT LONG TERM GOAL #1   Title Patient will be independent in home exercise program to improve strength/mobility for better functional independence with ADLs.   Time 8   Period Weeks   Status New     PT LONG TERM GOAL #2   Title Patient (75 years old) will complete five times sit to stand test in < 15 seconds indicating an increased LE strength and improved balance.   Time 8   Period Weeks   Status New  PT LONG TERM GOAL #3   Title Patient will tolerate 5 seconds of single leg stance without loss of balance to improve ability to get in and out of shower safely.   Time 8   Period Weeks   Status  New     PT LONG TERM GOAL #4   Title Patient will increase BLE gross strength to 4+/5 as to improve functional strength for independent gait, increased standing tolerance and increased ADL ability.   Time 8   Period Weeks   Status New               Plan - 08/07/2016 1407    Clinical Impression Statement Patient has weakess in all extremities and impaired dynamic standing balance. She has has one fall in the last year. She will benefit from skilled PT to improve strenghth and balance.    Rehab Potential Good   PT Frequency 2x / week   PT Duration 8 weeks   PT Treatment/Interventions Therapeutic exercise;Balance training;Therapeutic activities   PT Next Visit Plan strengthening and balance training   Consulted and Agree with Plan of Care Patient      Patient will benefit from skilled therapeutic intervention in order to improve the following deficits and impairments:  Abnormal gait, Decreased balance, Decreased endurance, Decreased strength  Visit Diagnosis: Muscle weakness (generalized)      G-Codes - 08/07/16 1441    Functional Assessment Tool Used TUG, 10 MW, 5 x sit to stand,    Functional Limitation Mobility: Walking and moving around   Mobility: Walking and Moving Around Current Status [id number]) At least 1 percent but less than 20 percent impaired, limited or restricted   Mobility: Walking and Moving Around Goal Status [id number]) 0 percent impaired, limited or restricted       Problem List Patient Active Problem List   Diagnosis Date Noted  . 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 ALS (amyotrophic lateral sclerosis) ([place]) 05/15/2016  . History of melanoma 01/20/2016  . Coagulation test abnormality 01/20/2016  . Hematoma 01/14/2016  . Right hip pain 12/28/2015  . Fall 12/28/2015  . Routine general medical examination at a health care facility 12/16/2015  . Memory loss 12/16/2015  . Muscle cramps at night 12/16/2015  . Dysarthria 12/16/2015  . Expressive aphasia  12/16/2015  . Bereavement 11/18/2015  . Hoarseness 09/22/2015  . Special screening for malignant neoplasms, colon 09/22/2015  . Dyspnea on exertion 07/21/2015  . History of renal cell cancer 09/07/2014  . Urinary incontinence 09/07/2014  . Contact dermatitis 04/08/2014  . Stopped smoking with greater than 25 pack year history 02/25/2014  . Irregular heart beat 01/30/2014  . Allergic rhinitis 01/30/2014  . Medicare annual wellness visit, subsequent 08/08/2012  . Screening for breast cancer 08/08/2012  . Atrophic vaginitis 08/08/2012  . GERD (gastroesophageal reflux disease) 05/07/2012  . History of cardiovascular disorder 04/30/2009  . COLITIS, ULCERATIVE 05/16/2007  . Personal history of malignant neoplasm of breast 05/16/2007   Alanson Puls, PT, DPT [place] 08-07-2016, 2:48 PM  [place] MAIN [facility] SERVICES [address] [place], [place], [address] Phone: [phone]   Fax:  [phone]  Name: VEATRICE MCCUMBER MRN: [id number] Date of Birth: [birth year]

## 2016-07-25 ENCOUNTER — Ambulatory Visit (INDEPENDENT_AMBULATORY_CARE_PROVIDER_SITE_OTHER): Payer: PPO | Admitting: Family Medicine

## 2016-07-25 ENCOUNTER — Other Ambulatory Visit (HOSPITAL_COMMUNITY)
Admission: RE | Admit: 2016-07-25 | Discharge: 2016-07-25 | Disposition: A | Discharge status: Home / Self Care | Payer: PPO | Source: Ambulatory Visit | Attending: Family Medicine | Admitting: Family Medicine

## 2016-07-25 ENCOUNTER — Encounter: Payer: Self-pay | Admitting: Family Medicine

## 2016-07-25 VITALS — BP 120/70 | HR 78 | Temp 97.7°F | Wt 134.2 lb

## 2016-07-25 DIAGNOSIS — Z79899 Other long term (current) drug therapy: Secondary | ICD-10-CM | POA: Diagnosis not present

## 2016-07-25 DIAGNOSIS — N898 Other specified noninflammatory disorders of vagina: Secondary | ICD-10-CM

## 2016-07-25 DIAGNOSIS — Z113 Encounter for screening for infections with a predominantly sexual mode of transmission: Secondary | ICD-10-CM | POA: Diagnosis not present

## 2016-07-25 DIAGNOSIS — R102 Pelvic and perineal pain: Secondary | ICD-10-CM | POA: Diagnosis not present

## 2016-07-25 LAB — POCT URINALYSIS DIPSTICK
Bilirubin, UA: NEGATIVE
Blood, UA: 3
GLUCOSE UA: NEGATIVE
Ketones, UA: NEGATIVE
NITRITE UA: POSITIVE
PROTEIN UA: 1
Spec Grav, UA: >=1.03
UROBILINOGEN UA: NEGATIVE
pH, UA: 6

## 2016-07-25 LAB — COMPREHENSIVE METABOLIC PANEL
ALBUMIN: 4 g/dL (ref 3.5–5.2)
ALT: 11 U/L (ref 0–35)
AST: 15 U/L (ref 0–37)
Alkaline Phosphatase: 48 U/L (ref 39–117)
BUN: 21 mg/dL (ref 6–23)
CHLORIDE: 105 meq/L (ref 96–112)
CO2: 28 mEq/L (ref 19–32)
CREATININE: 1.08 mg/dL (ref 0.40–1.20)
Calcium: 9 mg/dL (ref 8.4–10.5)
GFR: 52.19 mL/min — ABNORMAL LOW (ref >60.00)
GLUCOSE: 97 mg/dL (ref 70–99)
POTASSIUM: 3.8 meq/L (ref 3.5–5.1)
SODIUM: 140 meq/L (ref 135–145)
TOTAL PROTEIN: 7.3 g/dL (ref 6.0–8.3)
Total Bilirubin: 0.5 mg/dL (ref 0.2–1.2)

## 2016-07-25 MED ORDER — CEPHALEXIN 500 MG PO CAPS: 500.0000 mg | ORAL_CAPSULE | Freq: Two times a day (BID) | ORAL | 0 refills | Status: DC

## 2016-07-25 NOTE — Assessment & Plan Note (Signed)
Symptoms and UA most consistent with UTI. We'll send urine for culture and microscopy. Given report of vaginal discharge we attempted a pelvic exam though our equipment was not technically adequate. We did a blind wet prep and gonorrhea and chlamydia testing which will be sent to the lab. She'll be started on Keflex. She is given return precautions.

## 2016-07-25 NOTE — Progress Notes (Signed)
  Tommi Rumps, MD Phone: [phone]  Mary Fields is a 75 y.o. female who presents today for same day visit.  Patient notes onset of lower abdominal discomfort on Friday of last week. Notes she has pain with urination in her suprapubic region though no burning with urination. She has chronic urinary urgency that is unchanged. No urinary frequency. No diarrhea or nausea or vomiting. No fevers. She does have vaginal discharge that she reports is normal for her. She reports she feels weird in her vagina. She is not sexually active. She status post appendectomy, hysterectomy, and BSO.  PMH: Former smoker.   ROS see history of present illness  Objective  Physical Exam Vitals:   07/25/16 0834  BP: 120/70  Pulse: 78  Temp: 97.7 F (36.5 C)    BP Readings from Last 3 Encounters:  07/25/16 120/70  07/19/16 140/80  07/05/16 140/88   Wt Readings from Last 3 Encounters:  07/25/16 134 lb 4 oz (60.9 kg)  07/19/16 134 lb 7 oz (61 kg)  07/05/16 133 lb 4 oz (60.4 kg)    Physical Exam  Constitutional: She is well-developed, well-nourished, and in no distress.  Cardiovascular: Normal rate, regular rhythm and normal heart sounds.   Pulmonary/Chest: Effort normal and breath sounds normal.  Abdominal: Soft. Bowel sounds are normal. She exhibits no distension. There is tenderness (mild suprapubic discomfort). There is no rebound and no guarding.  Genitourinary:  Genitourinary Comments: Pelvic exam attempted though equipment is inadequate given level of vaginal introitus narrowing and atrophy, blind swabs with wet prep and gonorrhea and chlamydia were performed with no apparent discharge  Musculoskeletal: She exhibits no edema.  Neurological: She is alert.  Skin: Skin is warm and dry.     Assessment/Plan: Please see individual problem list.  Suprapubic pain Symptoms and UA most consistent with UTI. We'll send urine for culture and microscopy. Given report of vaginal discharge we  attempted a pelvic exam though our equipment was not technically adequate. We did a blind wet prep and gonorrhea and chlamydia testing which will be sent to the lab. She'll be started on Keflex. She is given return precautions.   Orders Placed This Encounter  Procedures  . Urine Culture  . WET PREP BY MOLECULAR PROBE  . Comprehensive metabolic panel  . POCT Urinalysis Dipstick    Meds ordered this encounter  Medications  . cephALEXin (KEFLEX) 500 MG capsule    Sig: Take 1 capsule (500 mg total) by mouth 2 (two) times daily.    Dispense:  14 capsule    Refill:  0    Tommi Rumps, MD [place]

## 2016-07-25 NOTE — Patient Instructions (Signed)
Nice to see you. You have a UTI. We will treat with Keflex. If you develop worsening abdominal pain, fevers, chills, or any new or change in symptoms please seek medical attention immediately.

## 2016-07-26 ENCOUNTER — Encounter: Payer: Self-pay | Admitting: Physical Therapy

## 2016-07-26 ENCOUNTER — Ambulatory Visit: Payer: PPO | Admitting: Physical Therapy

## 2016-07-26 DIAGNOSIS — M6281 Muscle weakness (generalized): Secondary | ICD-10-CM | POA: Diagnosis not present

## 2016-07-26 LAB — WET PREP BY MOLECULAR PROBE
Candida species: NEGATIVE
Gardnerella vaginalis: NEGATIVE
Trichomonas vaginosis: NEGATIVE

## 2016-07-26 LAB — CERVICOVAGINAL ANCILLARY ONLY
Chlamydia: NEGATIVE
Neisseria Gonorrhea: NEGATIVE

## 2016-07-26 NOTE — Therapy (Signed)
[place] MAIN [facility] SERVICES [address] [place], [place], [address] Phone: [phone]   Fax:  [phone]  Physical Therapy Treatment  Patient Details  Name: Mary Fields MRN: [id number] Date of Birth: December 22, 1940 Referring Provider: Alda Berthold   Encounter Date: 07/26/2016      PT End of Session - 07/26/16 1625    Visit Number 2   Number of Visits 17   Date for PT Re-Evaluation 08/28/16   Authorization Type Gcodes 2/10   PT Start Time [id number]   PT Stop Time 1428   PT Time Calculation (min) 30 min   Equipment Utilized During Treatment Gait belt   Activity Tolerance Patient tolerated treatment well   Behavior During Therapy [facility] for tasks assessed/performed      Past Medical History:  Diagnosis Date  . 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 Atypical nevi   . Benign neoplasm of skin, site unspecified    Dr. Aubery Lapping  . Colitis   . 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 Disorder of bone and cartilage, unspecified   . Diverticulosis   . Dizziness and giddiness   . Dyspnea on exertion   . Erosive esophagitis   . Esophageal reflux   . Herpes simplex without mention of complication   . Hip fracture ([place]) 11/12   surgery  . Hx-TIA (transient ischemic attack)   . Malignant neoplasm of kidney, except pelvis   . 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 Other malaise and fatigue   . Palpitations   . Palpitations    a. 07/2015 Echo: EF 60-65%, Gr 1 DD, mild AI.  Marland Kitchen Personal history of malignant neoplasm of breast   . Personal history of other malignant neoplasm of skin   . Tobacco abuse   . Ulcerative colitis, unspecified   . Unspecified asthma(493.90)    since childhood  . Vaginal atrophy   . Vaginal stenosis     Past Surgical History:  Procedure Laterality Date  . ABDOMINAL HYSTERECTOMY  1986   endometriosis, Dr. Pearletha Furl  . APPENDECTOMY    . BREAST LUMPECTOMY Right 1995   right breast  cancer, lumpectomy and XRT and chemo  . COLONOSCOPY  4/10   diverticulosis; recheck 5 years  . ESOPHAGOGASTRODUODENOSCOPY  2002   esophagitis; grade 1, erosive  . HIP FRACTURE SURGERY Right 2005   hemiarthroplasty right, Dr. Francia Greaves  . HIP FRACTURE SURGERY Left 11/12   after fall at church  . MELANOMA EXCISION  1980   Dr. Sharlet Salina  . NEPHRECTOMY  2000   left renal cell carcinoma, Dr. Jacqlyn Larsen    There were no vitals filed for this visit.      Subjective Assessment - 07/26/16 1401    Subjective Pt states she is feeling good today. She states that she gets SOB every once and a while and it is not doing well today. She does not understand why she is only coming once a week.    How long can you sit comfortably? unlimited   How long can you stand comfortably? unlimited   Currently in Pain? No/denies     Treatment: Static stance on airex, x15 sec, increased with BUE flexion x10, min VCs to slow down when performing Standing marches on airex, 2x10; min VCs to decrease UE support, 0-1 HHA, CGA for safety, patient warned to watch where she was placing her LE, she bumped knee on // bars, reports mild pain  that dissipates after ~8 minutes. Forward/backwards walking on airex balance beam, with [place] ball placements 4x4 ball movements; instructed to alternate UE, min VCs to decrease UE support, CGA-minA for stability BOSU ball forward taps, 2x10, min VCs for initiation of exercises and slowing exercise down to decrease risk for falls BLE leg press, 90#, 3x10, yellow tband around knees to decrease knee valgus, min VCs to maintain knees in appropriate plane  Instructed in use of red putty for grip and pinch strength; instructed to perform when watching tv or when think about it at home  Putty squeezes, x5 each UE  Chuck pinch, x2 each UE  Single leg stance, 2x5-8 seconds each LE, increased difficulty on LLE, min VCs for decreased UE support, CGA-minA for stability Static stance on bolster (flat  side up); eyes open 3x30 seconds, eyes closed 2x10 seconds minA-modA for safety, 0 HHA unless losing balance                              PT Education - 07/26/16 1624    Education provided Yes   Education Details continued plan of care, putty work at home with B Archivist) Educated Patient   Methods Explanation;Demonstration   Comprehension Verbalized understanding;Returned demonstration             PT Long Term Goals - 07/24/16 1437      PT LONG TERM GOAL #1   Title Patient will be independent in home exercise program to improve strength/mobility for better functional independence with ADLs.   Time 8   Period Weeks   Status New     PT LONG TERM GOAL #2   Title Patient (> 1 years old) will complete five times sit to stand test in < 15 seconds indicating an increased LE strength and improved balance.   Time 8   Period Weeks   Status New     PT LONG TERM GOAL #3   Title Patient will tolerate 5 seconds of single leg stance without loss of balance to improve ability to get in and out of shower safely.   Time 8   Period Weeks   Status New     PT LONG TERM GOAL #4   Title Patient will increase BLE gross strength to 4+/5 as to improve functional strength for independent gait, increased standing tolerance and increased ADL ability.   Time 8   Period Weeks   Status New               Plan - 07/26/16 1626    Clinical Impression Statement Patient presented to PT session 15 min. late. During session, PT states "I don't think this is doing anything for me". Explained to patient what exercises we were working on and what they were addressing. Towards the end of session, patient seemed to understand purpose. She requires CGA during exercises for safety and min VCs for correct performance. Pt was given red theraputty for home in order to work on grip strength and pinch grip. Pt would continue to benefit from skilled PT to address LE/UE strength and  high level balance.   Rehab Potential Good   PT Frequency 2x / week   PT Duration 8 weeks   PT Treatment/Interventions Therapeutic exercise;Balance training;Therapeutic activities   PT Next Visit Plan strengthening and balance training   Consulted and Agree with Plan of Care Patient      Patient will benefit from  skilled therapeutic intervention in order to improve the following deficits and impairments:  Abnormal gait, Decreased balance, Decreased endurance, Decreased strength  Visit Diagnosis: Muscle weakness (generalized)     Problem List Patient Active Problem List   Diagnosis Date Noted  . Suprapubic pain 07/25/2016  . 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 History of melanoma 01/20/2016  . Coagulation test abnormality 01/20/2016  . Hematoma 01/14/2016  . Right hip pain 12/28/2015  . Fall 12/28/2015  . Routine general medical examination at a health care facility 12/16/2015  . Memory loss 12/16/2015  . Muscle cramps at night 12/16/2015  . Dysarthria 12/16/2015  . Expressive aphasia 12/16/2015  . Bereavement 11/18/2015  . Hoarseness 09/22/2015  . Special screening for malignant neoplasms, colon 09/22/2015  . Dyspnea on exertion 07/21/2015  . History of renal cell cancer 09/07/2014  . Urinary incontinence 09/07/2014  . Contact dermatitis 04/08/2014  . Stopped smoking with greater than 25 pack year history 02/25/2014  . Irregular heart beat 01/30/2014  . Allergic rhinitis 01/30/2014  . Medicare annual wellness visit, subsequent 08/08/2012  . Screening for breast cancer 08/08/2012  . Atrophic vaginitis 08/08/2012  . GERD (gastroesophageal reflux disease) 05/07/2012  . History of cardiovascular disorder 04/30/2009  . COLITIS, ULCERATIVE 05/16/2007  . Personal history of malignant neoplasm of breast 05/16/2007   Tilman Neat, SPT This entire session was performed under direct supervision and direction of a  licensed therapist/therapist assistant . I have personally read, edited and approve of the note as written.  Trotter,Margaret PT, DPT 07/27/2016, 8:57 AM  [place] MAIN [facility] SERVICES [address] [place], [place], [address] Phone: [phone]   Fax:  [phone]  Name: Mary Fields MRN: [id number] Date of Birth: Oct 09, 1941

## 2016-07-27 ENCOUNTER — Encounter: Payer: Self-pay | Admitting: Neurology

## 2016-07-27 ENCOUNTER — Encounter: Payer: Self-pay | Admitting: Physical Therapy

## 2016-07-27 LAB — URINE CULTURE

## 2016-07-31 ENCOUNTER — Ambulatory Visit: Payer: PPO | Admitting: Physical Therapy

## 2016-08-02 ENCOUNTER — Encounter: Payer: Self-pay | Admitting: Physical Therapy

## 2016-08-02 ENCOUNTER — Encounter: Payer: Self-pay | Admitting: Neurology

## 2016-08-02 ENCOUNTER — Ambulatory Visit: Payer: PPO | Admitting: Physical Therapy

## 2016-08-02 ENCOUNTER — Telehealth: Payer: Self-pay | Admitting: Neurology

## 2016-08-02 DIAGNOSIS — M6281 Muscle weakness (generalized): Secondary | ICD-10-CM | POA: Diagnosis not present

## 2016-08-02 DIAGNOSIS — G1221 Amyotrophic lateral sclerosis: Secondary | ICD-10-CM

## 2016-08-02 NOTE — Therapy (Signed)
[place] MAIN [facility] SERVICES [address] [place], [place], [address] Phone: [phone]   Fax:  [phone]  Physical Therapy Treatment  Patient Details  Name: Mary Fields MRN: [id number] Date of Birth: May 04, 1941 Referring Provider: Alda Berthold   Encounter Date: 08/02/2016      PT End of Session - 08/02/16 1555    Visit Number 3   Number of Visits 17   Date for PT Re-Evaluation 08/28/16   Authorization Type Gcodes 3/10   PT Start Time 1446   PT Stop Time 1532   PT Time Calculation (min) 46 min   Equipment Utilized During Treatment Gait belt   Activity Tolerance Patient tolerated treatment well   Behavior During Therapy [facility] for tasks assessed/performed      Past Medical History:  Diagnosis Date  . 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 Atypical nevi   . Benign neoplasm of skin, site unspecified    Dr. Aubery Lapping  . Colitis   . 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 Disorder of bone and cartilage, unspecified   . Diverticulosis   . Dizziness and giddiness   . Dyspnea on exertion   . Erosive esophagitis   . Esophageal reflux   . Herpes simplex without mention of complication   . Hip fracture ([place]) 11/12   surgery  . Hx-TIA (transient ischemic attack)   . Malignant neoplasm of kidney, except pelvis   . 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 Other malaise and fatigue   . Palpitations   . Palpitations    a. 07/2015 Echo: EF 60-65%, Gr 1 DD, mild AI.  Marland Kitchen Personal history of malignant neoplasm of breast   . Personal history of other malignant neoplasm of skin   . Tobacco abuse   . Ulcerative colitis, unspecified   . Unspecified asthma(493.90)    since childhood  . Vaginal atrophy   . Vaginal stenosis     Past Surgical History:  Procedure Laterality Date  . ABDOMINAL HYSTERECTOMY  1986   endometriosis, Dr. Pearletha Furl  . APPENDECTOMY    . BREAST LUMPECTOMY Right 1995   right  breast cancer, lumpectomy and XRT and chemo  . COLONOSCOPY  4/10   diverticulosis; recheck 5 years  . ESOPHAGOGASTRODUODENOSCOPY  2002   esophagitis; grade 1, erosive  . HIP FRACTURE SURGERY Right 2005   hemiarthroplasty right, Dr. Francia Greaves  . HIP FRACTURE SURGERY Left 11/12   after fall at church  . MELANOMA EXCISION  1980   Dr. Sharlet Salina  . NEPHRECTOMY  2000   left renal cell carcinoma, Dr. Jacqlyn Larsen    There were no vitals filed for this visit.      Subjective Assessment - 08/02/16 1449    Subjective Pt states she is feeling well today. She states she has had no changes since last therapy session. Notes she has been using the putty at home.   How long can you sit comfortably? unlimited   How long can you stand comfortably? unlimited   Currently in Pain? No/denies      Treatment:  NuStep, L3 x3 minutes, BLE only; unbilled BOSU ball step ups (x20) and taps, x5 each LE; min-modA for stability Lateral walking on airex beam with ball toss, x4, minA for stability, min VCs for sequencing Tandem stance on airex, each LE leading, red/green UE clips x4 clips, x4 reps with increased UE reaching for clips in multi-planar directions BLE leg press,  90# 2 reps, increased to 110# for 2x10, min VCs and yellow tband to decrease knee valgus and knee hyperextension LE leading on dynadisc, each LE leading, 2.5# BUE into flexion 2x10 Leading LE on 4" step, post LE on airex pad, 2x5 UE reaches to cone in transverse plane Instructed in dynamic balance, CGA-minA for stability; vertical head turns, horizontal head turns, and backwards walking 2x10 meters each, min VCs for initiation of exercises Static stance on half bolster (flat side up), with 2.5# weight bar "air" push ups, 2x10, no HHA, min-modA for stability  Instructed patient in practicing tandem stance and single leg stance at home. Instructed to perform in corner with chair in front for safety. Handout given Tandem stance, x15 sec each LE, minA for  stability                            PT Education - 08/02/16 1554    Education provided Yes   Education Details HEP progressed-single leg stance and tandem stance   Person(s) Educated Patient   Methods Explanation;Handout;Demonstration   Comprehension Verbalized understanding;Returned demonstration             PT Long Term Goals - 07/24/16 1437      PT LONG TERM GOAL #1   Title Patient will be independent in home exercise program to improve strength/mobility for better functional independence with ADLs.   Time 8   Period Weeks   Status New     PT LONG TERM GOAL #2   Title Patient (> 49 years old) will complete five times sit to stand test in < 15 seconds indicating an increased LE strength and improved balance.   Time 8   Period Weeks   Status New     PT LONG TERM GOAL #3   Title Patient will tolerate 5 seconds of single leg stance without loss of balance to improve ability to get in and out of shower safely.   Time 8   Period Weeks   Status New     PT LONG TERM GOAL #4   Title Patient will increase BLE gross strength to 4+/5 as to improve functional strength for independent gait, increased standing tolerance and increased ADL ability.   Time 8   Period Weeks   Status New               Plan - 08/02/16 1555    Clinical Impression Statement Pt presents to PT with pleasant attitude and is excited to work. Throughout the session, she requires CGA-minA during balance exercises. Due to patient's BUE weakness, balance activities focused on combined UE strengthening. The pt had mild back pain with BLE leg press that she noted resolved after 1-2 reps. Pt would continue to benefit from skilled PT in order to address BLE/UE weakness, static and dynamic balance.   Rehab Potential Good   PT Frequency 2x / week   PT Duration 8 weeks   PT Treatment/Interventions Therapeutic exercise;Balance training;Therapeutic activities   PT Next Visit Plan  strengthening and balance training   Consulted and Agree with Plan of Care Patient      Patient will benefit from skilled therapeutic intervention in order to improve the following deficits and impairments:  Abnormal gait, Decreased balance, Decreased endurance, Decreased strength  Visit Diagnosis: Muscle weakness (generalized)     Problem List Patient Active Problem List   Diagnosis Date Noted  . Suprapubic pain 07/25/2016  . PBA (pseudobulbar  affect) 07/19/2016  . 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 History of melanoma 01/20/2016  . Coagulation test abnormality 01/20/2016  . Hematoma 01/14/2016  . Right hip pain 12/28/2015  . Fall 12/28/2015  . Routine general medical examination at a health care facility 12/16/2015  . Memory loss 12/16/2015  . Muscle cramps at night 12/16/2015  . Dysarthria 12/16/2015  . Expressive aphasia 12/16/2015  . Bereavement 11/18/2015  . Hoarseness 09/22/2015  . Special screening for malignant neoplasms, colon 09/22/2015  . Dyspnea on exertion 07/21/2015  . History of renal cell cancer 09/07/2014  . Urinary incontinence 09/07/2014  . Contact dermatitis 04/08/2014  . Stopped smoking with greater than 25 pack year history 02/25/2014  . Irregular heart beat 01/30/2014  . Allergic rhinitis 01/30/2014  . Medicare annual wellness visit, subsequent 08/08/2012  . Screening for breast cancer 08/08/2012  . Atrophic vaginitis 08/08/2012  . GERD (gastroesophageal reflux disease) 05/07/2012  . History of cardiovascular disorder 04/30/2009  . COLITIS, ULCERATIVE 05/16/2007  . Personal history of malignant neoplasm of breast 05/16/2007   Tilman Neat, SPT This entire session was performed under direct supervision and direction of a licensed therapist/therapist assistant . I have personally read, edited and approve of the note as written.  Trotter,Margaret PT, DPT 08/02/2016, 6:16 PM  [place]  MAIN [facility] SERVICES [address] [place], [place], [address] Phone: [phone]   Fax:  [phone]  Name: Mary Fields MRN: [id number] Date of Birth: [birth year]

## 2016-08-02 NOTE — Patient Instructions (Addendum)
Tandem Stance    Right foot in front of left, heel touching toe both feet "straight ahead". Stand on Foot Triangle of Support with both feet. Balance in this position _10__ seconds. Do with left foot in front of right.  Copyright  VHI. All rights reserved.  POSITION: Single Leg Balance: Neck Rotated / Stance Side    Stand on right leg.Hold _10__ seconds. _2-3__ reps _2__ times per day.  http://ggbe.exer.us/19   Copyright  VHI. All rights reserved.

## 2016-08-02 NOTE — Telephone Encounter (Signed)
Referral faxed to [facility] at [phone] with confirmation received.

## 2016-08-02 NOTE — Telephone Encounter (Signed)
-----   Message from Alda Berthold, DO sent at 08/02/2016 10:43 AM EDT ----- Regarding: [facility] referral Luvenia Starch, do you mind sending a referral to [facility] for this patient - newly diagnosed ALS.    Thanks.  Donika

## 2016-08-04 ENCOUNTER — Ambulatory Visit: Payer: PPO

## 2016-08-04 VITALS — BP 111/75 | HR 75

## 2016-08-04 DIAGNOSIS — M6281 Muscle weakness (generalized): Secondary | ICD-10-CM

## 2016-08-04 NOTE — Therapy (Signed)
[place] MAIN [facility] SERVICES [address] [place], [place], [address] Phone: [phone]   Fax:  [phone]  Physical Therapy Treatment  Patient Details  Name: Mary Fields MRN: [id number] Date of Birth: 01-25-1941 Referring Provider: Alda Berthold   Encounter Date: 08/04/2016      PT End of Session - 08/04/16 0911    Visit Number 4   Number of Visits 17   Date for PT Re-Evaluation 08/28/16   Authorization Type Gcodes 4/10   PT Start Time 0915   PT Stop Time 1000   PT Time Calculation (min) 45 min   Equipment Utilized During Treatment Gait belt   Activity Tolerance Patient tolerated treatment well   Behavior During Therapy [facility] for tasks assessed/performed      Past Medical History:  Diagnosis Date  . 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 Atypical nevi   . Benign neoplasm of skin, site unspecified    Dr. Aubery Lapping  . Colitis   . 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 Disorder of bone and cartilage, unspecified   . Diverticulosis   . Dizziness and giddiness   . Dyspnea on exertion   . Erosive esophagitis   . Esophageal reflux   . Herpes simplex without mention of complication   . Hip fracture ([place]) 11/12   surgery  . Hx-TIA (transient ischemic attack)   . Malignant neoplasm of kidney, except pelvis   . 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 Other malaise and fatigue   . Palpitations   . Palpitations    a. 07/2015 Echo: EF 60-65%, Gr 1 DD, mild AI.  Marland Kitchen Personal history of malignant neoplasm of breast   . Personal history of other malignant neoplasm of skin   . Tobacco abuse   . Ulcerative colitis, unspecified   . Unspecified asthma(493.90)    since childhood  . Vaginal atrophy   . Vaginal stenosis     Past Surgical History:  Procedure Laterality Date  . ABDOMINAL HYSTERECTOMY  1986   endometriosis, Dr. Pearletha Furl  . APPENDECTOMY    . BREAST LUMPECTOMY Right 1995   right  breast cancer, lumpectomy and XRT and chemo  . COLONOSCOPY  4/10   diverticulosis; recheck 5 years  . ESOPHAGOGASTRODUODENOSCOPY  2002   esophagitis; grade 1, erosive  . HIP FRACTURE SURGERY Right 2005   hemiarthroplasty right, Dr. Francia Greaves  . HIP FRACTURE SURGERY Left 11/12   after fall at church  . MELANOMA EXCISION  1980   Dr. Sharlet Salina  . NEPHRECTOMY  2000   left renal cell carcinoma, Dr. Jacqlyn Larsen    Vitals:   08/04/16 0918  BP: 111/75  Pulse: 75  SpO2: 99%        Subjective Assessment - 08/04/16 0911    Subjective Pt reports she is doing well on this date. She denies any pain currently but does report some SOB which is normal for her at baseline. No specific questions or concerns at this time.    How long can you sit comfortably? unlimited   How long can you stand comfortably? unlimited   Currently in Pain? No/denies        Treatment:  Ther-ex NuStep, L3 x3 minutes, BLE only; unbilled BLE leg press 110# x10, 125# 2 x 10 min VCs and yellow tband to decrease knee valgus; BOSU forward lunges without UE support alternating LE x 10 each; BOSU lateral lunges without UE support x  10 bilateral; Sit to stand from chair with 2kg overhead ball press 2 x 10 without UE support to push up;  Neuromuscular Re-education BOSU step ups alternating LE x 10 each min-modA for stability Airex cone taps in 1 and 2 cone sequences as called out by therapist; Braiding in // bars x 4 lengths; Lateral walking on airex beam with 2kg overhead ball press with each step, minA for stability and min VCs for sequencing;                         PT Education - 08/04/16 0911    Education provided Yes   Education Details HEP reinforced   Person(s) Educated Patient   Methods Explanation   Comprehension Verbalized understanding             PT Long Term Goals - 07/24/16 1437      PT LONG TERM GOAL #1   Title Patient will be independent in home exercise program to improve  strength/mobility for better functional independence with ADLs.   Time 8   Period Weeks   Status New     PT LONG TERM GOAL #2   Title Patient (> 44 years old) will complete five times sit to stand test in < 15 seconds indicating an increased LE strength and improved balance.   Time 8   Period Weeks   Status New     PT LONG TERM GOAL #3   Title Patient will tolerate 5 seconds of single leg stance without loss of balance to improve ability to get in and out of shower safely.   Time 8   Period Weeks   Status New     PT LONG TERM GOAL #4   Title Patient will increase BLE gross strength to 4+/5 as to improve functional strength for independent gait, increased standing tolerance and increased ADL ability.   Time 8   Period Weeks   Status New               Plan - 08/04/16 0912    Clinical Impression Statement Pt is very eager to work with physical therapy on this date. She demonstrates great motivation throughout the session. Pt demonstrates ability to progress resistance with quantum leg press. She is fatigued with sit to stand without overhead ball press but is eager to incorporate her UE into program due to weakness. No HEP progression on this date but pt encouraged to continue with currently prescribed exercises and follow-up as scheduled. Will consider HEP progression at next session.    Rehab Potential Good   PT Frequency 2x / week   PT Duration 8 weeks   PT Treatment/Interventions Therapeutic exercise;Balance training;Therapeutic activities   PT Next Visit Plan strengthening and balance training   PT Home Exercise Plan As prescribed   Consulted and Agree with Plan of Care Patient      Patient will benefit from skilled therapeutic intervention in order to improve the following deficits and impairments:  Abnormal gait, Decreased balance, Decreased endurance, Decreased strength  Visit Diagnosis: Muscle weakness (generalized)     Problem List Patient Active Problem  List   Diagnosis Date Noted  . Suprapubic pain 07/25/2016  . 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 History of melanoma 01/20/2016  . Coagulation test abnormality 01/20/2016  . Hematoma 01/14/2016  . Right hip pain 12/28/2015  . Fall 12/28/2015  . Routine general medical examination at a  health care facility 12/16/2015  . Memory loss 12/16/2015  . Muscle cramps at night 12/16/2015  . Dysarthria 12/16/2015  . Expressive aphasia 12/16/2015  . Bereavement 11/18/2015  . Hoarseness 09/22/2015  . Special screening for malignant neoplasms, colon 09/22/2015  . Dyspnea on exertion 07/21/2015  . History of renal cell cancer 09/07/2014  . Urinary incontinence 09/07/2014  . Contact dermatitis 04/08/2014  . Stopped smoking with greater than 25 pack year history 02/25/2014  . Irregular heart beat 01/30/2014  . Allergic rhinitis 01/30/2014  . Medicare annual wellness visit, subsequent 08/08/2012  . Screening for breast cancer 08/08/2012  . Atrophic vaginitis 08/08/2012  . GERD (gastroesophageal reflux disease) 05/07/2012  . History of cardiovascular disorder 04/30/2009  . COLITIS, ULCERATIVE 05/16/2007  . Personal history of malignant neoplasm of breast 05/16/2007   Phillips Grout PT, DPT   Marilyn Wing 08/04/2016, 11:17 AM  [place] MAIN [facility] SERVICES [address] [place], [place], [address] Phone: [phone]   Fax:  [phone]  Name: SCHNELL GAVINO MRN: [id number] Date of Birth: 11/25/1940

## 2016-08-07 ENCOUNTER — Ambulatory Visit: Payer: PPO

## 2016-08-09 ENCOUNTER — Encounter: Payer: Self-pay | Admitting: Physical Therapy

## 2016-08-09 ENCOUNTER — Ambulatory Visit: Payer: PPO | Admitting: Physical Therapy

## 2016-08-09 ENCOUNTER — Encounter: Payer: Self-pay | Admitting: Family Medicine

## 2016-08-09 DIAGNOSIS — M6281 Muscle weakness (generalized): Secondary | ICD-10-CM | POA: Diagnosis not present

## 2016-08-09 NOTE — Therapy (Signed)
[place] MAIN [facility] SERVICES [address] [place], [place], [address] Phone: [phone]   Fax:  [phone]  Physical Therapy Treatment  Patient Details  Name: COCO MENZIE MRN: [id number] Date of Birth: Aug 03, 1941 Referring Provider: Alda Berthold   Encounter Date: 08/09/2016      PT End of Session - 08/09/16 1503    Visit Number 5   Number of Visits 17   Date for PT Re-Evaluation 08/28/16   Authorization Type Gcodes 5/10   PT Start Time 1308   PT Stop Time 1346   PT Time Calculation (min) 38 min   Equipment Utilized During Treatment Gait belt   Activity Tolerance Patient tolerated treatment well   Behavior During Therapy [facility] for tasks assessed/performed      Past Medical History:  Diagnosis Date  . 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 Atypical nevi   . Benign neoplasm of skin, site unspecified    Dr. Aubery Lapping  . Colitis   . 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 Disorder of bone and cartilage, unspecified   . Diverticulosis   . Dizziness and giddiness   . Dyspnea on exertion   . Erosive esophagitis   . Esophageal reflux   . Herpes simplex without mention of complication   . Hip fracture ([place]) 11/12   surgery  . Hx-TIA (transient ischemic attack)   . Malignant neoplasm of kidney, except pelvis   . 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 Other malaise and fatigue   . Palpitations   . Palpitations    a. 07/2015 Echo: EF 60-65%, Gr 1 DD, mild AI.  Marland Kitchen Personal history of malignant neoplasm of breast   . Personal history of other malignant neoplasm of skin   . Tobacco abuse   . Ulcerative colitis, unspecified   . Unspecified asthma(493.90)    since childhood  . Vaginal atrophy   . Vaginal stenosis     Past Surgical History:  Procedure Laterality Date  . ABDOMINAL HYSTERECTOMY  1986   endometriosis, Dr. Pearletha Furl  . APPENDECTOMY    . BREAST LUMPECTOMY Right 1995   right  breast cancer, lumpectomy and XRT and chemo  . COLONOSCOPY  4/10   diverticulosis; recheck 5 years  . ESOPHAGOGASTRODUODENOSCOPY  2002   esophagitis; grade 1, erosive  . HIP FRACTURE SURGERY Right 2005   hemiarthroplasty right, Dr. Francia Greaves  . HIP FRACTURE SURGERY Left 11/12   after fall at church  . MELANOMA EXCISION  1980   Dr. Sharlet Salina  . NEPHRECTOMY  2000   left renal cell carcinoma, Dr. Jacqlyn Larsen    There were no vitals filed for this visit.      Subjective Assessment - 08/09/16 1309    Subjective Patient states she is doing well. She states she has soreness after therapy lasting a couple days but no issues. SOB continues but maintains it's normal status.   How long can you sit comfortably? unlimited   How long can you stand comfortably? unlimited   Currently in Pain? No/denies     Treatment: Sit to stands with UE overhead ball press, 4.4# weighted ball, 2x8, min VCs to control eccentric movement Static stand feet together and tandem stance (each LE leading) on airex, UE overhead reaches with red/green clothes pins, 3x5 clothes pins, CGA for safety BLE leg press, 110#, 2x10, yellow tband at knees to decrease knee valgus, min VCs to decrease knee hyperextension Static stand  feet together on airex, 3x20 seconds, eyes closed, minA for stability Feet together on airex pad, x10 horizontal and vertical head movements each, CGA for safety Forward tapping onto 4" step from airex pad, CGA for safety, 2x15, min VCs to control movement Lateral walking with Matrix at 7.5#, CGA for SPT, min VCs to move opposite of pulling back movement in order to balance, shift from midline x1, patient able to self correct Agility ladder walking, CGA for safety, min VCs for different activities:  2 feet in each opening: x2 1 foot in each opening: x4, x2 with instruction to not look at her feet with movement 2 feet in and out each opening: x2, min VCs for sequencing of this  activity                              PT Education - 08/09/16 1503    Education provided Yes   Education Details normal response for muscle soreness, why decreasing visual cues during balance is more difficult   Person(s) Educated Patient   Methods Explanation   Comprehension Verbalized understanding             PT Long Term Goals - 07/24/16 1437      PT LONG TERM GOAL #1   Title Patient will be independent in home exercise program to improve strength/mobility for better functional independence with ADLs.   Time 8   Period Weeks   Status New     PT LONG TERM GOAL #2   Title Patient (> 71 years old) will complete five times sit to stand test in < 15 seconds indicating an increased LE strength and improved balance.   Time 8   Period Weeks   Status New     PT LONG TERM GOAL #3   Title Patient will tolerate 5 seconds of single leg stance without loss of balance to improve ability to get in and out of shower safely.   Time 8   Period Weeks   Status New     PT LONG TERM GOAL #4   Title Patient will increase BLE gross strength to 4+/5 as to improve functional strength for independent gait, increased standing tolerance and increased ADL ability.   Time 8   Period Weeks   Status New               Plan - 08/09/16 1507    Clinical Impression Statement Patient continues to be motivated to work with physical therapy. She is able to maintain the progression of the Quantum leg press and perform balance activities with CGA-minA to improve her safety. She especially has difficulty when she is unable to rely on her visual system. She requires min VCs to perform exercises appropriately. She states her goal is to improve her ability to stand on her R leg. The patient would continue to benefit from skilled PT in order to address BLE weakness, high level balance activities, and improve safety with mobility.    Rehab Potential Good   PT Frequency 2x / week    PT Duration 8 weeks   PT Treatment/Interventions Therapeutic exercise;Balance training;Therapeutic activities   PT Next Visit Plan strengthening and balance training   PT Home Exercise Plan As prescribed   Consulted and Agree with Plan of Care Patient      Patient will benefit from skilled therapeutic intervention in order to improve the following deficits and impairments:  Abnormal gait, Decreased  balance, Decreased endurance, Decreased strength  Visit Diagnosis: Muscle weakness (generalized)     Problem List Patient Active Problem List   Diagnosis Date Noted  . Suprapubic pain 07/25/2016  . 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 History of melanoma 01/20/2016  . Coagulation test abnormality 01/20/2016  . Hematoma 01/14/2016  . Right hip pain 12/28/2015  . Fall 12/28/2015  . Routine general medical examination at a health care facility 12/16/2015  . Memory loss 12/16/2015  . Muscle cramps at night 12/16/2015  . Dysarthria 12/16/2015  . Expressive aphasia 12/16/2015  . Bereavement 11/18/2015  . Hoarseness 09/22/2015  . Special screening for malignant neoplasms, colon 09/22/2015  . Dyspnea on exertion 07/21/2015  . History of renal cell cancer 09/07/2014  . Urinary incontinence 09/07/2014  . Contact dermatitis 04/08/2014  . Stopped smoking with greater than 25 pack year history 02/25/2014  . Irregular heart beat 01/30/2014  . Allergic rhinitis 01/30/2014  . Medicare annual wellness visit, subsequent 08/08/2012  . Screening for breast cancer 08/08/2012  . Atrophic vaginitis 08/08/2012  . GERD (gastroesophageal reflux disease) 05/07/2012  . History of cardiovascular disorder 04/30/2009  . COLITIS, ULCERATIVE 05/16/2007  . Personal history of malignant neoplasm of breast 05/16/2007   Tilman Neat, SPT  This entire session was performed under direct supervision and direction of a licensed therapist/therapist  assistant . I have personally read, edited and approve of the note as written.  Collie Siad PT, DPT 08/09/2016, 4:25 PM  [place] MAIN [facility] SERVICES [address] [place], [place], [address] Phone: [phone]   Fax:  [phone]  Name: MELANEE KVALE MRN: [id number] Date of Birth: [birth year]

## 2016-08-14 ENCOUNTER — Telehealth: Payer: Self-pay | Admitting: Neurology

## 2016-08-14 ENCOUNTER — Encounter: Payer: Self-pay | Admitting: Neurology

## 2016-08-14 ENCOUNTER — Ambulatory Visit: Payer: PPO | Admitting: Physical Therapy

## 2016-08-14 ENCOUNTER — Encounter: Payer: Self-pay | Admitting: Physical Therapy

## 2016-08-14 DIAGNOSIS — M6281 Muscle weakness (generalized): Secondary | ICD-10-CM | POA: Diagnosis not present

## 2016-08-14 NOTE — Telephone Encounter (Signed)
EMG results faxed.

## 2016-08-14 NOTE — Therapy (Signed)
[place] MAIN [facility] SERVICES [address] [place], [place], [address] Phone: [phone]   Fax:  [phone]  Physical Therapy Treatment  Patient Details  Name: Mary Fields MRN: [id number] Date of Birth: 77/04/1941 Referring Provider: Alda Berthold   Encounter Date: 08/14/2016      PT End of Session - 08/14/16 1732    Visit Number 6   Number of Visits 17   Date for PT Re-Evaluation 08/28/16   Authorization Type Gcodes 6/10   PT Start Time 1645   PT Stop Time 1728   PT Time Calculation (min) 43 min   Equipment Utilized During Treatment Gait belt   Activity Tolerance Patient tolerated treatment well   Behavior During Therapy [facility] for tasks assessed/performed      Past Medical History:  Diagnosis Date  . 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 Atypical nevi   . Benign neoplasm of skin, site unspecified    Dr. Aubery Lapping  . Colitis   . 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 Disorder of bone and cartilage, unspecified   . Diverticulosis   . Dizziness and giddiness   . Dyspnea on exertion   . Erosive esophagitis   . Esophageal reflux   . Herpes simplex without mention of complication   . Hip fracture ([place]) 11/12   surgery  . Hx-TIA (transient ischemic attack)   . Malignant neoplasm of kidney, except pelvis   . 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 Other malaise and fatigue   . Palpitations   . Palpitations    a. 07/2015 Echo: EF 60-65%, Gr 1 DD, mild AI.  Marland Kitchen Personal history of malignant neoplasm of breast   . Personal history of other malignant neoplasm of skin   . Tobacco abuse   . Ulcerative colitis, unspecified   . Unspecified asthma(493.90)    since childhood  . Vaginal atrophy   . Vaginal stenosis     Past Surgical History:  Procedure Laterality Date  . ABDOMINAL HYSTERECTOMY  1986   endometriosis, Dr. Pearletha Furl  . APPENDECTOMY    . BREAST LUMPECTOMY Right 1995   right  breast cancer, lumpectomy and XRT and chemo  . COLONOSCOPY  4/10   diverticulosis; recheck 5 years  . ESOPHAGOGASTRODUODENOSCOPY  2002   esophagitis; grade 1, erosive  . HIP FRACTURE SURGERY Right 2005   hemiarthroplasty right, Dr. Francia Greaves  . HIP FRACTURE SURGERY Left 11/12   after fall at church  . MELANOMA EXCISION  1980   Dr. Sharlet Salina  . NEPHRECTOMY  2000   left renal cell carcinoma, Dr. Jacqlyn Larsen    There were no vitals filed for this visit.      Subjective Assessment - 08/14/16 1643    Subjective Patient states the LE leg press causes her mild hip pain and coccyx . She states she is doing well and is in no pain. Denies falls   How long can you sit comfortably? unlimited   How long can you stand comfortably? unlimited   Currently in Pain? No/denies     Treatment:  BOSU ball taps, CGA for safety, min VCs for initial set up BOSU ball step ups, 2x10, minA to maintain stability, min VCs for foot placement BOSU ball lateral lunges, 2x10, min VCs to maintain feet facing forward and to move further away form BOSU ball Resisted hip extension, red tband, 2x10, min VCs for initial set up and to maintain upright posture,  decreased from green tband and MATRIX 2.5# weight due to difficulty Tilt board balance in lateral and ant/post position while moving green clothes pins 2x4 each UE.  2 feet on dynadisc balls in tandem stance, with 2.2# and 4.4# x4, increased pain d/c, patient  Leading foot on dynadisc with 2.2# weight x3, increased to 4.4# weighted ball x5 each LE leading into BUE flexion, during activity patient asked if painful, denied pain, after exercise ended patient noted mild pain in L lower back, patient instructed to continue informing PT and not press through the pain Light STM mobilization to L post hip/LBP to relieve pain, ~2 minutes Sidelying clam shells, no resistance, x10 on LLE, x10 and x5 on RLE, min VCs for initial set up     After STM and clam shells, patient notes a  decrease in pain Lateral walking, red tband, min VCs for BLE positioning and to perform greater hip/knee flexion on LLE  Patient stated she had mild L hip pain upon leaving but it was better; instructed to ice or heat for 20 min. To relieve pain.                              PT Education - 08/14/16 1731    Education provided Yes   Education Details heat/ice for pain relief prn, continuing with HEP   Person(s) Educated Patient   Methods Explanation   Comprehension Verbalized understanding             PT Long Term Goals - 07/24/16 1437      PT LONG TERM GOAL #1   Title Patient will be independent in home exercise program to improve strength/mobility for better functional independence with ADLs.   Time 8   Period Weeks   Status New     PT LONG TERM GOAL #2   Title Patient (> 77 years old) will complete five times sit to stand test in < 15 seconds indicating an increased LE strength and improved balance.   Time 8   Period Weeks   Status New     PT LONG TERM GOAL #3   Title Patient will tolerate 5 seconds of single leg stance without loss of balance to improve ability to get in and out of shower safely.   Time 8   Period Weeks   Status New     PT LONG TERM GOAL #4   Title Patient will increase BLE gross strength to 4+/5 as to improve functional strength for independent gait, increased standing tolerance and increased ADL ability.   Time 8   Period Weeks   Status New               Plan - 08/14/16 1732    Clinical Impression Statement Pt states she was able to stand on each leg 10 seconds for the first time, which was one of her goals. She was able to tolerate all activities except for dynadisc activities increased L hip pain. She was instructed to perform mild bed exercises and light STM was performed. She stated that the area felt better but still was not great. She was instructed to ice/heat for 20 min. for pain relief when she went home. The  patient demonstrates weak glut max muscles and poor control during the movements. The patient would continue to benefit from skilled PT in order to address BLE weakness, high level balance activites, and improve safety with mobility.  Rehab Potential Good   PT Frequency 2x / week   PT Duration 8 weeks   PT Treatment/Interventions Therapeutic exercise;Balance training;Therapeutic activities   PT Next Visit Plan strengthening and balance training   PT Home Exercise Plan As prescribed   Consulted and Agree with Plan of Care Patient      Patient will benefit from skilled therapeutic intervention in order to improve the following deficits and impairments:  Abnormal gait, Decreased balance, Decreased endurance, Decreased strength  Visit Diagnosis: Muscle weakness (generalized)     Problem List Patient Active Problem List   Diagnosis Date Noted  . Suprapubic pain 07/25/2016  . 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 History of melanoma 01/20/2016  . Coagulation test abnormality 01/20/2016  . Hematoma 01/14/2016  . Right hip pain 12/28/2015  . Fall 12/28/2015  . Routine general medical examination at a health care facility 12/16/2015  . Memory loss 12/16/2015  . Muscle cramps at night 12/16/2015  . Dysarthria 12/16/2015  . Expressive aphasia 12/16/2015  . Bereavement 11/18/2015  . Hoarseness 09/22/2015  . Special screening for malignant neoplasms, colon 09/22/2015  . Dyspnea on exertion 07/21/2015  . History of renal cell cancer 09/07/2014  . Urinary incontinence 09/07/2014  . Contact dermatitis 04/08/2014  . Stopped smoking with greater than 25 pack year history 02/25/2014  . Irregular heart beat 01/30/2014  . Allergic rhinitis 01/30/2014  . Medicare annual wellness visit, subsequent 08/08/2012  . Screening for breast cancer 08/08/2012  . Atrophic vaginitis 08/08/2012  . GERD (gastroesophageal reflux disease)  05/07/2012  . History of cardiovascular disorder 04/30/2009  . COLITIS, ULCERATIVE 05/16/2007  . Personal history of malignant neoplasm of breast 05/16/2007   Tilman Neat, SPT This entire session was performed under direct supervision and direction of a licensed therapist/therapist assistant . I have personally read, edited and approve of the note as written.  Trotter,Margaret  PT, DPT 08/15/2016, 10:25 AM  [place] MAIN [facility] SERVICES [address] [place], [place], [address] Phone: [phone]   Fax:  [phone]  Name: VERNADETTE SEHNERT MRN: [id number] Date of Birth: 1941/09/14

## 2016-08-14 NOTE — Telephone Encounter (Signed)
Mary Fields 12/29/1940. [facility] called needing to have her EMG and Nerve Conduction study reports faxed to them at [phone]. Thank you

## 2016-08-16 ENCOUNTER — Encounter: Payer: Self-pay | Admitting: Physical Therapy

## 2016-08-16 ENCOUNTER — Ambulatory Visit: Payer: PPO | Admitting: Physical Therapy

## 2016-08-16 DIAGNOSIS — M6281 Muscle weakness (generalized): Secondary | ICD-10-CM | POA: Diagnosis not present

## 2016-08-16 NOTE — Therapy (Signed)
[place] MAIN [facility] SERVICES [address] [place], [place], [address] Phone: [phone]   Fax:  [phone]  Physical Therapy Treatment  Patient Details  Name: Mary Fields MRN: [id number] Date of Birth: 1941-06-07 Referring Provider: Alda Berthold   Encounter Date: 08/16/2016      PT End of Session - 08/16/16 1654    Visit Number 7   Number of Visits 17   Date for PT Re-Evaluation 08/28/16   Authorization Type Gcodes 7/10   PT Start Time [id number]   PT Stop Time 1645   PT Time Calculation (min) 38 min   Equipment Utilized During Treatment Gait belt   Activity Tolerance Patient tolerated treatment well   Behavior During Therapy [facility] for tasks assessed/performed      Past Medical History:  Diagnosis Date  . 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 Atypical nevi   . Benign neoplasm of skin, site unspecified    Dr. Aubery Lapping  . Colitis   . 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 Disorder of bone and cartilage, unspecified   . Diverticulosis   . Dizziness and giddiness   . Dyspnea on exertion   . Erosive esophagitis   . Esophageal reflux   . Herpes simplex without mention of complication   . Hip fracture ([place]) 11/12   surgery  . Hx-TIA (transient ischemic attack)   . Malignant neoplasm of kidney, except pelvis   . 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 Other malaise and fatigue   . Palpitations   . Palpitations    a. 07/2015 Echo: EF 60-65%, Gr 1 DD, mild AI.  Marland Kitchen Personal history of malignant neoplasm of breast   . Personal history of other malignant neoplasm of skin   . Tobacco abuse   . Ulcerative colitis, unspecified   . Unspecified asthma(493.90)    since childhood  . Vaginal atrophy   . Vaginal stenosis     Past Surgical History:  Procedure Laterality Date  . ABDOMINAL HYSTERECTOMY  1986   endometriosis, Dr. Pearletha Furl  . APPENDECTOMY    . BREAST LUMPECTOMY Right 1995   right  breast cancer, lumpectomy and XRT and chemo  . COLONOSCOPY  4/10   diverticulosis; recheck 5 years  . ESOPHAGOGASTRODUODENOSCOPY  2002   esophagitis; grade 1, erosive  . HIP FRACTURE SURGERY Right 2005   hemiarthroplasty right, Dr. Francia Greaves  . HIP FRACTURE SURGERY Left 11/12   after fall at church  . MELANOMA EXCISION  1980   Dr. Sharlet Salina  . NEPHRECTOMY  2000   left renal cell carcinoma, Dr. Jacqlyn Larsen    There were no vitals filed for this visit.      Subjective Assessment - 08/16/16 1617    Subjective Patient denies pain and states she is doing well. Denies falls   How long can you sit comfortably? unlimited   How long can you stand comfortably? unlimited   Currently in Pain? No/denies     Treatment:   Dual tasking with walking    Tossing ball with forward walking x2, min VCs to continue with normal walking speed, CGA for safety    Tossing ball with lateral walking, x2 each LE leading, min VCs to not cross feet, CGA-minA for safety Sit to stands with 4.4# weight overhead, denies pain during activity, min VCs to decrease height of ball raise due to patient's difficulty maintain core and mild hip flexion throughout movement Reactive stepping  towards 4 colors in lateral, forward, and backwards stepping, 3x10, min VCs to respond to called out color, CGA-minA for safety Agility ladder over red mat for dynamic balance training    2 feet in each step, x1, CGA for safety    1 foot in each step, x2, CGA for safety    Sidestepping, x2 each LE leading, min VCs to regain balance before beginning activity again, CGA-minA for safety Static stance on BOSU ball with UE finger pinches, x8 with each UE, 4 blue and 4 green clothespins, improvement since last session, minA to maintain balance, min VCs to focus on balance and not UE task as much due to safety Lateral walking with 2.5# bar weight into forward punches, x2, patient notes increased hip discomfort so exercise d/c Afterwards, patient notes  that she has discomfort during sideways walking; she denies pain during other activities. Again patient instructed to communicate with PT if an activity is painful.                           PT Education - 08/16/16 1653    Education provided Yes   Education Details heat/ice for pain relief   Person(s) Educated Patient   Methods Explanation   Comprehension Verbalized understanding             PT Long Term Goals - 07/24/16 1437      PT LONG TERM GOAL #1   Title Patient will be independent in home exercise program to improve strength/mobility for better functional independence with ADLs.   Time 8   Period Weeks   Status New     PT LONG TERM GOAL #2   Title Patient (> 77 years old) will complete five times sit to stand test in < 15 seconds indicating an increased LE strength and improved balance.   Time 8   Period Weeks   Status New     PT LONG TERM GOAL #3   Title Patient will tolerate 5 seconds of single leg stance without loss of balance to improve ability to get in and out of shower safely.   Time 8   Period Weeks   Status New     PT LONG TERM GOAL #4   Title Patient will increase BLE gross strength to 4+/5 as to improve functional strength for independent gait, increased standing tolerance and increased ADL ability.   Time 8   Period Weeks   Status New               Plan - 08/16/16 1654    Clinical Impression Statement Pt notes that she has been able to perform opening of her clothes hanger recently, which has been progress. She was able to tolerate activities again but reports mild L hip pain with lateral walking. She notes this after the activity; she was instructed to communicate with PT while pain is occurring in order that activity can be stopped. She was able to progress to blue clothes pins. She demonstrates poor dual tasking with performing UE activities and balance training. Instructed to focus on balance instead of UE activity for  safety. Patient would continue to benefit from skilled PT in order to address BLE weakness, high level balance activities, and improve safety with mobility.   Rehab Potential Good   PT Frequency 2x / week   PT Duration 8 weeks   PT Treatment/Interventions Therapeutic exercise;Balance training;Therapeutic activities   PT Next Visit Plan strengthening and  balance training   PT Home Exercise Plan As prescribed   Consulted and Agree with Plan of Care Patient      Patient will benefit from skilled therapeutic intervention in order to improve the following deficits and impairments:  Abnormal gait, Decreased balance, Decreased endurance, Decreased strength  Visit Diagnosis: Muscle weakness (generalized)     Problem List Patient Active Problem List   Diagnosis Date Noted  . Suprapubic pain 07/25/2016  . 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 History of melanoma 01/20/2016  . Coagulation test abnormality 01/20/2016  . Hematoma 01/14/2016  . Right hip pain 12/28/2015  . Fall 12/28/2015  . Routine general medical examination at a health care facility 12/16/2015  . Memory loss 12/16/2015  . Muscle cramps at night 12/16/2015  . Dysarthria 12/16/2015  . Expressive aphasia 12/16/2015  . Bereavement 11/18/2015  . Hoarseness 09/22/2015  . Special screening for malignant neoplasms, colon 09/22/2015  . Dyspnea on exertion 07/21/2015  . History of renal cell cancer 09/07/2014  . Urinary incontinence 09/07/2014  . Contact dermatitis 04/08/2014  . Stopped smoking with greater than 25 pack year history 02/25/2014  . Irregular heart beat 01/30/2014  . Allergic rhinitis 01/30/2014  . Medicare annual wellness visit, subsequent 08/08/2012  . Screening for breast cancer 08/08/2012  . Atrophic vaginitis 08/08/2012  . GERD (gastroesophageal reflux disease) 05/07/2012  . History of cardiovascular disorder 04/30/2009  . COLITIS, ULCERATIVE  05/16/2007  . Personal history of malignant neoplasm of breast 05/16/2007   Tilman Neat, SPT This entire session was performed under direct supervision and direction of a licensed therapist/therapist assistant . I have personally read, edited and approve of the note as written.  Trotter,Margaret PT, DPT 08/17/2016, 8:33 AM  [place] MAIN [facility] SERVICES [address] [place], [place], [address] Phone: [phone]   Fax:  [phone]  Name: BRAVERY COONFIELD MRN: [id number] Date of Birth: December 25, 1940

## 2016-08-18 ENCOUNTER — Encounter: Payer: Self-pay | Admitting: Neurology

## 2016-08-20 ENCOUNTER — Encounter: Payer: Self-pay | Admitting: Neurology

## 2016-08-22 ENCOUNTER — Encounter: Payer: Self-pay | Admitting: Physical Therapy

## 2016-08-22 ENCOUNTER — Ambulatory Visit: Payer: PPO | Admitting: Physical Therapy

## 2016-08-22 ENCOUNTER — Encounter: Payer: Self-pay | Admitting: Neurology

## 2016-08-22 DIAGNOSIS — M6281 Muscle weakness (generalized): Secondary | ICD-10-CM | POA: Diagnosis not present

## 2016-08-22 NOTE — Therapy (Signed)
[place] MAIN [facility] SERVICES [address] [place], [place], [address] Phone: [phone]   Fax:  [phone]  Physical Therapy Treatment  Patient Details  Name: Mary Fields MRN: [id number] Date of Birth: 1941-08-06 Referring Provider: Alda Berthold   Encounter Date: 08/22/2016      PT End of Session - 08/22/16 1620    Visit Number 8   Number of Visits 17   Date for PT Re-Evaluation 08/28/16   Authorization Type Gcodes 8/10   PT Start Time 1526   PT Stop Time 1612   PT Time Calculation (min) 46 min   Equipment Utilized During Treatment Gait belt   Activity Tolerance Patient tolerated treatment well   Behavior During Therapy [facility] for tasks assessed/performed      Past Medical History:  Diagnosis Date  . 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 Atypical nevi   . Benign neoplasm of skin, site unspecified    Dr. Aubery Lapping  . Colitis   . 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 Disorder of bone and cartilage, unspecified   . Diverticulosis   . Dizziness and giddiness   . Dyspnea on exertion   . Erosive esophagitis   . Esophageal reflux   . Herpes simplex without mention of complication   . Hip fracture ([place]) 11/12   surgery  . Hx-TIA (transient ischemic attack)   . Malignant neoplasm of kidney, except pelvis   . 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 Other malaise and fatigue   . Palpitations   . Palpitations    a. 07/2015 Echo: EF 60-65%, Gr 1 DD, mild AI.  Marland Kitchen Personal history of malignant neoplasm of breast   . Personal history of other malignant neoplasm of skin   . Tobacco abuse   . Ulcerative colitis, unspecified   . Unspecified asthma(493.90)    since childhood  . Vaginal atrophy   . Vaginal stenosis     Past Surgical History:  Procedure Laterality Date  . ABDOMINAL HYSTERECTOMY  1986   endometriosis, Dr. Pearletha Furl  . APPENDECTOMY    . BREAST LUMPECTOMY Right 1995   right  breast cancer, lumpectomy and XRT and chemo  . COLONOSCOPY  4/10   diverticulosis; recheck 5 years  . ESOPHAGOGASTRODUODENOSCOPY  2002   esophagitis; grade 1, erosive  . HIP FRACTURE SURGERY Right 2005   hemiarthroplasty right, Dr. Francia Greaves  . HIP FRACTURE SURGERY Left 11/12   after fall at church  . MELANOMA EXCISION  1980   Dr. Sharlet Salina  . NEPHRECTOMY  2000   left renal cell carcinoma, Dr. Jacqlyn Larsen    There were no vitals filed for this visit.      Subjective Assessment - 08/22/16 1530    Subjective Patient states she's had not changes form last session. Denies falls and states she's doing well.    How long can you sit comfortably? unlimited   How long can you stand comfortably? unlimited   Currently in Pain? No/denies      Treatment: Warm up on NuStep, L3x3 min, min VCs to keep knees in line, unbilled Sit to stand, when in standing performed 2.5# weighted bar chest press, yellow band around knees to decrease knee valgus, 3x8, min VCs to control eccentric movement and decrease knee valgus Wall squats against green pball, 2x10, CGA for safety, min VCs for foot position and to increase depth to challenge LE more, patient notes tiredness in BLE and  relief in lower back Tandem walking forwards/backwards, CGA-minA for safety, no HHA, min VCs to maintain feet close and regain balance before beginning again Static stance on half bolster (flat side up), dual task with blue, green, and red UE clothes pins, x8     Pins on clipping area, instructed to take off with reactive reaching to instructed color, x2, minA for safety, no HHA unless LOB     Instructed patient to clip clothes pins on clipping area, x2, minA for safety, no HHA unless LOB     Throughout min VCs to slow down to increase safety, and that a greater amount of her focus should be on balance for safety.   Forward and diagonal taps on dots while on airex pad, CGA-minA for safety, min VCs to perform activity slower and regain balance  before beginning again, 0 HHA  Following activities on grass outside due to patient report of difficulty on carpeted and uneven surfaces    Instructed patient to walk then SPT randomly called stop and turn, patient perform 180 degree, x4, CGA-minA for stability    Agility ladder: forwards walking 1 foot in each box, lateral walking, in-in-out-out every other box, x2 each different movement, CGA-minA for stability, min VCs for initiation of exercise    Walking with dual task of horizontal and vertical head turns, x2 each, minA for stability                          PT Education - 08/22/16 1531    Education provided Yes   Education Details safety with movement, small improvements   Person(s) Educated Patient   Methods Explanation   Comprehension Verbalized understanding             PT Long Term Goals - 07/24/16 1437      PT LONG TERM GOAL #1   Title Patient will be independent in home exercise program to improve strength/mobility for better functional independence with ADLs.   Time 8   Period Weeks   Status New     PT LONG TERM GOAL #2   Title Patient (> 46 years old) will complete five times sit to stand test in < 15 seconds indicating an increased LE strength and improved balance.   Time 8   Period Weeks   Status New     PT LONG TERM GOAL #3   Title Patient will tolerate 5 seconds of single leg stance without loss of balance to improve ability to get in and out of shower safely.   Time 8   Period Weeks   Status New     PT LONG TERM GOAL #4   Title Patient will increase BLE gross strength to 4+/5 as to improve functional strength for independent gait, increased standing tolerance and increased ADL ability.   Time 8   Period Weeks   Status New               Plan - 08/22/16 1621    Clinical Impression Statement Pt denies pain throughout treatment. She requires CGA-minA during activity to maintain safety especially during higher level balance  activities. She requires mulitple VCs to focus greater on balance when performing a dual task. The patient improves activities after min VCs to address form. The patient would continue to benefit from skilled PT in order to address high level balance difficulties, UE strength, and improve safety with mobility.    Rehab Potential Good   PT  Frequency 2x / week   PT Duration 8 weeks   PT Treatment/Interventions Therapeutic exercise;Balance training;Therapeutic activities   PT Next Visit Plan strengthening and balance training; progress HEP with wall squats and lateral walking with resistance   PT Home Exercise Plan As prescribed   Consulted and Agree with Plan of Care Patient      Patient will benefit from skilled therapeutic intervention in order to improve the following deficits and impairments:  Abnormal gait, Decreased balance, Decreased endurance, Decreased strength  Visit Diagnosis: Muscle weakness (generalized)     Problem List Patient Active Problem List   Diagnosis Date Noted  . Suprapubic pain 07/25/2016  . 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 History of melanoma 01/20/2016  . Coagulation test abnormality 01/20/2016  . Hematoma 01/14/2016  . Right hip pain 12/28/2015  . Fall 12/28/2015  . Routine general medical examination at a health care facility 12/16/2015  . Memory loss 12/16/2015  . Muscle cramps at night 12/16/2015  . Dysarthria 12/16/2015  . Expressive aphasia 12/16/2015  . Bereavement 11/18/2015  . Hoarseness 09/22/2015  . Special screening for malignant neoplasms, colon 09/22/2015  . Dyspnea on exertion 07/21/2015  . History of renal cell cancer 09/07/2014  . Urinary incontinence 09/07/2014  . Contact dermatitis 04/08/2014  . Stopped smoking with greater than 25 pack year history 02/25/2014  . Irregular heart beat 01/30/2014  . Allergic rhinitis 01/30/2014  . Medicare annual wellness visit,  subsequent 08/08/2012  . Screening for breast cancer 08/08/2012  . Atrophic vaginitis 08/08/2012  . GERD (gastroesophageal reflux disease) 05/07/2012  . History of cardiovascular disorder 04/30/2009  . COLITIS, ULCERATIVE 05/16/2007  . Personal history of malignant neoplasm of breast 05/16/2007   Tilman Neat, SPT This entire session was performed under direct supervision and direction of a licensed therapist/therapist assistant . I have personally read, edited and approve of the note as written.  Trotter,Margaret PT, DPT 08/22/2016, 5:13 PM  [place] MAIN [facility] SERVICES [address] [place], [place], [address] Phone: [phone]   Fax:  [phone]  Name: Mary Fields MRN: [id number] Date of Birth: [birth year]

## 2016-08-24 ENCOUNTER — Encounter: Payer: Self-pay | Admitting: Physical Therapy

## 2016-08-24 ENCOUNTER — Ambulatory Visit: Payer: PPO | Attending: Neurology | Admitting: Physical Therapy

## 2016-08-24 DIAGNOSIS — M6281 Muscle weakness (generalized): Secondary | ICD-10-CM | POA: Insufficient documentation

## 2016-08-24 NOTE — Therapy (Signed)
[place] MAIN [facility] SERVICES [address] [place], [place], [address] Phone: [phone]   Fax:  [phone]  Physical Therapy Treatment/Progress Note  Patient Details  Name: Mary Fields MRN: [id number] Date of Birth: 1940/11/15 Referring Provider: Alda Berthold   Encounter Date: 08/24/2016      PT End of Session - 08/24/16 1348    Visit Number 9   Number of Visits 25   Date for PT Re-Evaluation 09/21/16   Authorization Type Gcodes 9/10   PT Start Time 1301   PT Stop Time 1345   PT Time Calculation (min) 44 min   Equipment Utilized During Treatment Gait belt   Activity Tolerance Patient tolerated treatment well   Behavior During Therapy [facility] for tasks assessed/performed      Past Medical History:  Diagnosis Date  . 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 Atypical nevi   . Benign neoplasm of skin, site unspecified    Dr. Aubery Lapping  . Colitis   . 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 Disorder of bone and cartilage, unspecified   . Diverticulosis   . Dizziness and giddiness   . Dyspnea on exertion   . Erosive esophagitis   . Esophageal reflux   . Herpes simplex without mention of complication   . Hip fracture ([place]) 11/12   surgery  . Hx-TIA (transient ischemic attack)   . Malignant neoplasm of kidney, except pelvis   . 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 Other malaise and fatigue   . Palpitations   . Palpitations    a. 07/2015 Echo: EF 60-65%, Gr 1 DD, mild AI.  Marland Kitchen Personal history of malignant neoplasm of breast   . Personal history of other malignant neoplasm of skin   . Tobacco abuse   . Ulcerative colitis, unspecified   . Unspecified asthma(493.90)    since childhood  . Vaginal atrophy   . Vaginal stenosis     Past Surgical History:  Procedure Laterality Date  . ABDOMINAL HYSTERECTOMY  1986   endometriosis, Dr. Pearletha Furl  . APPENDECTOMY    . BREAST LUMPECTOMY Right 1995    right breast cancer, lumpectomy and XRT and chemo  . COLONOSCOPY  4/10   diverticulosis; recheck 5 years  . ESOPHAGOGASTRODUODENOSCOPY  2002   esophagitis; grade 1, erosive  . HIP FRACTURE SURGERY Right 2005   hemiarthroplasty right, Dr. Francia Greaves  . HIP FRACTURE SURGERY Left 11/12   after fall at church  . MELANOMA EXCISION  1980   Dr. Sharlet Salina  . NEPHRECTOMY  2000   left renal cell carcinoma, Dr. Jacqlyn Larsen    There were no vitals filed for this visit.      Subjective Assessment - 08/24/16 1300    Subjective Patient states she hasn't slept well because she was having cramps in her legs all night. Denies falls.    How long can you sit comfortably? unlimited   How long can you stand comfortably? unlimited   Currently in Pain? No/denies     Treatment:  Patient's goals and outcome measures were re-assessed  5x STS: 81.23s >51 years old <15 seconds indicates decreased risk for falls MMT: Hip flexion: L 3+/5, R 4-/5 Hip abduction: L 4/5, R 4-/5  Hip adduction: L/R 4-/5 Gross strength LE strength otherwise is 4+/5 BUE gross strength is 4-/5  Patient able to stand on each LE ~3 seconds with poor control  Standing marches with/without red tband  resistance, x10 each, 0-2 HHA, patient notes increase in hip pain d/c Lateral walking with red tband resistance, x4, min VCs to increase step length, mild hip pain, light 2 HHA Sit to stands with bar push outs, 2x6 with min VCs to decrease knee valgus and control eccentric movement.   HEP progressed: Standing scapular retraction, red tband resistance, x10, min VCs for appropriate positioning Standing shoulder extension, red tband resistance, x10, min Vcs for initial set up and to move scapulas towards each other Bridges with hip ABD, red tband resistance, 2x5, min VCs to decrease distance of hip extension Instructed on standing calf stretch to decrease BLE leg cramps                           PT Education - 08/24/16  1348    Education provided Yes   Education Details HEP progressed   Person(s) Educated Patient   Methods Explanation;Handout;Verbal cues;Demonstration   Comprehension Verbalized understanding;Verbal cues required;Returned demonstration             PT Long Term Goals - 08/24/16 1349      PT LONG TERM GOAL #1   Title Patient will be independent in home exercise program to improve strength/mobility for better functional independence with ADLs.   Time 4   Period Weeks   Status On-going     PT LONG TERM GOAL #2   Title Patient (> 65 years old) will complete five times sit to stand test in < 15 seconds indicating an increased LE strength and improved balance.   Time 4   Period Weeks   Status Achieved     PT LONG TERM GOAL #3   Title Patient will tolerate 5 seconds of single leg stance without loss of balance to improve ability to get in and out of shower safely.   Time 4   Period Weeks   Status Partially Met     PT LONG TERM GOAL #4   Title Patient will increase BLE and BUE gross strength to 4+/5 as to improve functional strength for independent gait, increased standing tolerance and increased ADL ability.   Time 4   Period Weeks   Status Partially Met               Plan - 08/24/16 1348    Clinical Impression Statement Pt is progressing towards her goals with improvements in strength and balance. She continues to demonstrate hip weakness and difficulty performing single leg stance. She requires min VCs to improve form during activities. The patient would continue to benefit from skilled PT in order to address higher level balance and hip and BUE strengthening to improve ability to perform ADLs.    Rehab Potential Good   PT Frequency 2x / week   PT Duration 4 weeks   PT Treatment/Interventions Therapeutic exercise;Balance training;Therapeutic activities   PT Next Visit Plan strengthening and balance training; progress HEP with wall squats and lateral walking with  resistance   PT Home Exercise Plan HEP progressed   Consulted and Agree with Plan of Care Patient      Patient will benefit from skilled therapeutic intervention in order to improve the following deficits and impairments:  Abnormal gait, Decreased balance, Decreased endurance, Decreased strength  Visit Diagnosis: Muscle weakness (generalized)     Problem List Patient Active Problem List   Diagnosis Date Noted  . Suprapubic pain 07/25/2016  . 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 ALS (amyotrophic lateral  sclerosis) ([place]) 05/15/2016  . History of melanoma 01/20/2016  . Coagulation test abnormality 01/20/2016  . Hematoma 01/14/2016  . Right hip pain 12/28/2015  . Fall 12/28/2015  . Routine general medical examination at a health care facility 12/16/2015  . Memory loss 12/16/2015  . Muscle cramps at night 12/16/2015  . Dysarthria 12/16/2015  . Expressive aphasia 12/16/2015  . Bereavement 11/18/2015  . Hoarseness 09/22/2015  . Special screening for malignant neoplasms, colon 09/22/2015  . Dyspnea on exertion 07/21/2015  . History of renal cell cancer 09/07/2014  . Urinary incontinence 09/07/2014  . Contact dermatitis 04/08/2014  . Stopped smoking with greater than 25 pack year history 02/25/2014  . Irregular heart beat 01/30/2014  . Allergic rhinitis 01/30/2014  . Medicare annual wellness visit, subsequent 08/08/2012  . Screening for breast cancer 08/08/2012  . Atrophic vaginitis 08/08/2012  . GERD (gastroesophageal reflux disease) 05/07/2012  . History of cardiovascular disorder 04/30/2009  . COLITIS, ULCERATIVE 05/16/2007  . Personal history of malignant neoplasm of breast 05/16/2007   Tilman Neat, SPT This entire session was performed under direct supervision and direction of a licensed therapist/therapist assistant . I have personally read, edited and approve of the note as written.  Trotter,Margaret PT, DPT 08/24/2016, 2:58 PM   [place] MAIN [facility] SERVICES [address] [place], [place], [address] Phone: [phone]   Fax:  [phone]  Name: Mary Fields MRN: [id number] Date of Birth: 04/13/1941

## 2016-08-24 NOTE — Patient Instructions (Addendum)
Scapular Retraction: Unilateral (Standing)    With arms at sides, move right shoulder blade down and toward opposite side hip. Repeat __10__ times per set. Do __2__ sets per session. Do __1__ sessions per day.  http://orth.exer.us/947   Copyright  VHI. All rights reserved.  Scapular Retraction: Rowing (Eccentric) - Arms - 45 Degrees (Resistance Band)    Hold end of band in each hand. Pull back until elbows are even with trunk. Keep elbows out from sides at 45, thumbs up. Slowly release for 3-5 seconds. Use ____red____ resistance band. _10__ reps per set, _2__ sets per day, __5_ days per week.   http://ecce.exer.us/229   Copyright  VHI. All rights reserved.  Abductor Strength: Bridge Pose (Strap)    Make band wide enough to brace knees at hip width. Press into band with knees. Lift hips, press legs into band sideways and lower hips back down. Perform for __2__ sets. Repeat __5__ times.  Copyright  VHI. All rights reserved.

## 2016-08-28 ENCOUNTER — Encounter: Payer: Self-pay | Admitting: Physical Therapy

## 2016-08-28 ENCOUNTER — Ambulatory Visit: Payer: PPO | Admitting: Physical Therapy

## 2016-08-28 DIAGNOSIS — M6281 Muscle weakness (generalized): Secondary | ICD-10-CM

## 2016-08-28 NOTE — Therapy (Addendum)
[place] MAIN [facility] SERVICES [address] [place], [place], [address] Phone: [phone]   Fax:  [phone]  Physical Therapy Treatment  Patient Details  Name: Mary Fields MRN: [id number] Date of Birth: 12-29-1940 Referring Provider: Alda Berthold   Encounter Date: 08/28/2016      PT End of Session - 08/28/16 1601    Visit Number 10   Number of Visits 17   Date for PT Re-Evaluation 09/18/16   Authorization Type Gcodes 10/10   PT Start Time 1515   PT Stop Time 1600   PT Time Calculation (min) 45 min   Equipment Utilized During Treatment Gait belt   Activity Tolerance Patient tolerated treatment well   Behavior During Therapy [facility] for tasks assessed/performed      Past Medical History:  Diagnosis Date  . 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 Atypical nevi   . Benign neoplasm of skin, site unspecified    Dr. Aubery Lapping  . Colitis   . 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 Disorder of bone and cartilage, unspecified   . Diverticulosis   . Dizziness and giddiness   . Dyspnea on exertion   . Erosive esophagitis   . Esophageal reflux   . Herpes simplex without mention of complication   . Hip fracture ([place]) 11/12   surgery  . Hx-TIA (transient ischemic attack)   . Malignant neoplasm of kidney, except pelvis   . 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 Other malaise and fatigue   . Palpitations   . Palpitations    a. 07/2015 Echo: EF 60-65%, Gr 1 DD, mild AI.  Marland Kitchen Personal history of malignant neoplasm of breast   . Personal history of other malignant neoplasm of skin   . Tobacco abuse   . Ulcerative colitis, unspecified   . Unspecified asthma(493.90)    since childhood  . Vaginal atrophy   . Vaginal stenosis     Past Surgical History:  Procedure Laterality Date  . ABDOMINAL HYSTERECTOMY  1986   endometriosis, Dr. Pearletha Furl  . APPENDECTOMY    . BREAST LUMPECTOMY Right 1995   right  breast cancer, lumpectomy and XRT and chemo  . COLONOSCOPY  4/10   diverticulosis; recheck 5 years  . ESOPHAGOGASTRODUODENOSCOPY  2002   esophagitis; grade 1, erosive  . HIP FRACTURE SURGERY Right 2005   hemiarthroplasty right, Dr. Francia Greaves  . HIP FRACTURE SURGERY Left 11/12   after fall at church  . MELANOMA EXCISION  1980   Dr. Sharlet Salina  . NEPHRECTOMY  2000   left renal cell carcinoma, Dr. Jacqlyn Larsen    There were no vitals filed for this visit.      Subjective Assessment - 08/28/16 1518    Subjective Pt reports she is feeling well today with no complaints.  Pt reports no trouble with newest HEP.     Currently in Pain? No/denies        Treatment Nustep x 4 mins BUEs/BLEs level 5 (unbilled) Resisted walking forwards/backwards/sideways, x 2 laps each direction, #12.5 forwards/backwards, #7.5 sideways, min VCs for larger steps and greater eccentric control  Bosu ball step ups with 2 marches each repetition x 10 reps, CGA for stability, min VCs for slower steps and more upright posture  D1/D2 pattern standing on bosu ball, with #2, 1 set x 10 reps each motion with each arm, intermittent one HHA, min VCs for slower motion and proper control  Standing  retractions with red theraband resistance, 2 sets x 10 reps in tandem stance on purple airex pad, min A For stability, min VCs to increase scapular retraction Sidestepping on airex balance beam with ball toss, x 4 laps, CGA for safety, min VCs for increased step length  Monster steps over aerobic step, 1 set x 10 reps, CGA for stability, intermittent use of one hand, min VCs for explosive step up  Wall squats with stability ball, 2 sets x 10 reps, min VCs to prevent knee valgus and tighten glut muscles                         PT Education - 08/28/16 1601    Education provided Yes   Education Details reinforced HEP, drinking lots of water to reduce cramping    Person(s) Educated Patient   Methods  Explanation;Demonstration;Verbal cues   Comprehension Verbalized understanding;Returned demonstration;Verbal cues required             PT Long Term Goals - 08/29/16 0829      PT LONG TERM GOAL #1   Title Patient will be independent in home exercise program to improve strength/mobility for better functional independence with ADLs.   Time 8   Period Weeks   Status On-going     PT LONG TERM GOAL #2   Title Patient (> 53 years old) will complete five times sit to stand test in < 15 seconds indicating an increased LE strength and improved balance.   Time 8   Period Weeks   Status Achieved     PT LONG TERM GOAL #3   Title Patient will tolerate 5 seconds of single leg stance without loss of balance to improve ability to get in and out of shower safely.   Time 8   Period Weeks   Status Partially Met     PT LONG TERM GOAL #4   Title Patient will increase BLE and BUE gross strength to 4+/5 as to improve functional strength for independent gait, increased standing tolerance and increased ADL ability.   Time 8   Period Weeks   Status Partially Met               Plan - 08/28/16 1602    Clinical Impression Statement Pt continues to report good compliance with her HEP.  Pt was able to tolerate resisted walking, squats with stability ball on wall and monster steps over aerobic step without increase in pain.  Initated dynamic balance activites that involved use of UEs including sidestepping on airex beam with ball toss, scapular retractions in tandem stance and standing on bosu with D1/D2 pattern.  Pt demonstrates greater LOB leading with LLE in tandem stance and requires min VCS throughout for slower more controlled movements and upright posture.  She would benefit from further skilled PT to increase her dynamic balnace and LE strength for more functional mobility.     Rehab Potential Good   PT Frequency 2x / week   PT Duration 8 weeks   PT Treatment/Interventions Therapeutic  exercise;Balance training;Therapeutic activities   PT Next Visit Plan strengthening and balance training; progress HEP with wall squats and lateral walking with resistance   PT Home Exercise Plan HEP progressed   Consulted and Agree with Plan of Care Patient      Patient will benefit from skilled therapeutic intervention in order to improve the following deficits and impairments:  Abnormal gait, Decreased balance, Decreased endurance, Decreased strength, Dizziness  Visit Diagnosis: Muscle weakness (generalized)       G-Codes - 09/03/16 1700    Functional Assessment Tool Used clinical judgement   Functional Limitation Mobility: Walking and moving around   Mobility: Walking and Moving Around Current Status [phone]) At least 1 percent but less than 20 percent impaired, limited or restricted   Mobility: Walking and Moving Around Goal Status [phone]) 0 percent impaired, limited or restricted      Problem List Patient Active Problem List   Diagnosis Date Noted  . Suprapubic pain 07/25/2016  . 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 History of melanoma 01/20/2016  . Coagulation test abnormality 01/20/2016  . Hematoma 01/14/2016  . Right hip pain 12/28/2015  . Fall 12/28/2015  . Routine general medical examination at a health care facility 12/16/2015  . Memory loss 12/16/2015  . Muscle cramps at night 12/16/2015  . Dysarthria 12/16/2015  . Expressive aphasia 12/16/2015  . Bereavement 11/18/2015  . Hoarseness 09/22/2015  . Special screening for malignant neoplasms, colon 09/22/2015  . Dyspnea on exertion 07/21/2015  . History of renal cell cancer 09/07/2014  . Urinary incontinence 09/07/2014  . Contact dermatitis 04/08/2014  . Stopped smoking with greater than 25 pack year history 02/25/2014  . Irregular heart beat 01/30/2014  . Allergic rhinitis 01/30/2014  . Medicare annual wellness visit, subsequent 08/08/2012  .  Screening for breast cancer 08/08/2012  . Atrophic vaginitis 08/08/2012  . GERD (gastroesophageal reflux disease) 05/07/2012  . History of cardiovascular disorder 04/30/2009  . COLITIS, ULCERATIVE 05/16/2007  . Personal history of malignant neoplasm of breast 05/16/2007   Stacy Gardner, SPT  This entire session was performed under direct supervision and direction of a licensed therapist/therapist assistant . I have personally read, edited and approve of the note as written.   Trotter,Margaret PT, DPT 08/29/2016, 8:29 AM  [place] MAIN [facility] SERVICES [address] [place], [place], [address] Phone: [phone]   Fax:  [phone]  Name: Mary Fields MRN: [id number] Date of Birth: Jun 11, 1941

## 2016-08-29 ENCOUNTER — Ambulatory Visit: Payer: PPO | Admitting: Physical Therapy

## 2016-08-29 ENCOUNTER — Ambulatory Visit: Payer: PPO | Admitting: Speech Pathology

## 2016-08-31 ENCOUNTER — Encounter: Payer: Self-pay | Admitting: Physical Therapy

## 2016-08-31 ENCOUNTER — Ambulatory Visit: Payer: PPO | Admitting: Physical Therapy

## 2016-08-31 DIAGNOSIS — M6281 Muscle weakness (generalized): Secondary | ICD-10-CM

## 2016-08-31 NOTE — Therapy (Signed)
[place] MAIN [facility] SERVICES [address] [place], [place], [address] Phone: [phone]   Fax:  [phone]  Physical Therapy Treatment  Patient Details  Name: Mary Fields MRN: [id number] Date of Birth: 1940-12-30 Referring Provider: Alda Berthold   Encounter Date: 08/31/2016      PT End of Session - 08/31/16 1625    Visit Number 11   Number of Visits 17   Date for PT Re-Evaluation 09/18/16   Authorization Type Gcodes 1/10   PT Start Time 1425   PT Stop Time 1511   PT Time Calculation (min) 46 min   Equipment Utilized During Treatment Gait belt   Activity Tolerance Patient tolerated treatment well   Behavior During Therapy [facility] for tasks assessed/performed      Past Medical History:  Diagnosis Date  . 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 Atypical nevi   . Benign neoplasm of skin, site unspecified    Dr. Aubery Lapping  . Colitis   . 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 Disorder of bone and cartilage, unspecified   . Diverticulosis   . Dizziness and giddiness   . Dyspnea on exertion   . Erosive esophagitis   . Esophageal reflux   . Herpes simplex without mention of complication   . Hip fracture ([place]) 11/12   surgery  . Hx-TIA (transient ischemic attack)   . Malignant neoplasm of kidney, except pelvis   . 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 Other malaise and fatigue   . Palpitations   . Palpitations    a. 07/2015 Echo: EF 60-65%, Gr 1 DD, mild AI.  Marland Kitchen Personal history of malignant neoplasm of breast   . Personal history of other malignant neoplasm of skin   . Tobacco abuse   . Ulcerative colitis, unspecified   . Unspecified asthma(493.90)    since childhood  . Vaginal atrophy   . Vaginal stenosis     Past Surgical History:  Procedure Laterality Date  . ABDOMINAL HYSTERECTOMY  1986   endometriosis, Dr. Pearletha Furl  . APPENDECTOMY    . BREAST LUMPECTOMY Right 1995   right  breast cancer, lumpectomy and XRT and chemo  . COLONOSCOPY  4/10   diverticulosis; recheck 5 years  . ESOPHAGOGASTRODUODENOSCOPY  2002   esophagitis; grade 1, erosive  . HIP FRACTURE SURGERY Right 2005   hemiarthroplasty right, Dr. Francia Greaves  . HIP FRACTURE SURGERY Left 11/12   after fall at church  . MELANOMA EXCISION  1980   Dr. Sharlet Salina  . NEPHRECTOMY  2000   left renal cell carcinoma, Dr. Jacqlyn Larsen    There were no vitals filed for this visit.      Subjective Assessment - 08/31/16 1428    Subjective Pt reports she is feeling well and denies falls. She states that her HEP is going well, but she still struggles with single leg stance. She notes she is having troubles sleeping.    Currently in Pain? No/denies      Treatment: NuStep x4 minutes; BUE/LE L4; unbilled Wall squats with pball; 3x10, CGA for safety min VCs for foot placement and to decrease distance to decrease stress placed on knees. BOSU ball forward lunges, 2x10, CGA for safety, min VCs to maintain upright posture during activity Tandem walking with 1# weight overhead push on airex beam, mild discomfort in coccyx so d/c Static stand on BOSU ball, minA from SPT, 0 HHA, with patient performing UE  clothes pin clips x8 each UE with red, green, blue clips; multiple VCs to focus on balance greater than UE activity. Tandem stance on airex pad with UE performing D1 and D2 pattern with 2# weight, CGA for balance, 0 HHA, min VCs for correct technique Standing retractions with 5# weight on matrix with feet together on airex pad, 2x10, CGA for safety, min verbal and tactile cues for correct positioning Instructed on how to get up/down from floor x1; very difficult due to limited hip flexion; stopped activity; however instructed to perform with support on mat table and move BLE backwards  Agility ladder over red mat, 1 foot in each box, x4, min VCs to increase step height to decrease catching of feet on ladder; CGA for safety, shifts from  midline x2                              PT Education - 08/31/16 1625    Education provided Yes   Education Details POC, performing HEP on bed still   Person(s) Educated Patient   Methods Explanation   Comprehension Verbalized understanding             PT Long Term Goals - 08/29/16 0829      PT LONG TERM GOAL #1   Title Patient will be independent in home exercise program to improve strength/mobility for better functional independence with ADLs.   Time 8   Period Weeks   Status On-going     PT LONG TERM GOAL #2   Title Patient (> 54 years old) will complete five times sit to stand test in < 15 seconds indicating an increased LE strength and improved balance.   Time 8   Period Weeks   Status Achieved     PT LONG TERM GOAL #3   Title Patient will tolerate 5 seconds of single leg stance without loss of balance to improve ability to get in and out of shower safely.   Time 8   Period Weeks   Status Partially Met     PT LONG TERM GOAL #4   Title Patient will increase BLE and BUE gross strength to 4+/5 as to improve functional strength for independent gait, increased standing tolerance and increased ADL ability.   Time 8   Period Weeks   Status Partially Met               Plan - 08/31/16 1626    Clinical Impression Statement The patient states she is doing well today, but she had an episodes where she was unable to grip something that she needed to. She was able to tolerate exercise, but noted a mild increase in L hip soreness which she said was normal. Continued with dynamic balance activities that involved UE strengthening. She requires instructed to focus on balance more than UE activity if becoming unstable. MinA to maintain balance and min VCs to perform exercises technique. The patient would continue to benefit from skilled PT in order to address dynamic balance, LE strength, and improve functional mobility.    Rehab Potential Good   PT  Frequency 2x / week   PT Duration 8 weeks   PT Treatment/Interventions Therapeutic exercise;Balance training;Therapeutic activities   PT Next Visit Plan strengthening and balance training; progress HEP with wall squats and lateral walking with resistance   PT Home Exercise Plan HEP progressed   Consulted and Agree with Plan of Care Patient  Patient will benefit from skilled therapeutic intervention in order to improve the following deficits and impairments:  Abnormal gait, Decreased balance, Decreased endurance, Decreased strength, Dizziness  Visit Diagnosis: Muscle weakness (generalized)     Problem List Patient Active Problem List   Diagnosis Date Noted  . Suprapubic pain 07/25/2016  . 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 History of melanoma 01/20/2016  . Coagulation test abnormality 01/20/2016  . Hematoma 01/14/2016  . Right hip pain 12/28/2015  . Fall 12/28/2015  . Routine general medical examination at a health care facility 12/16/2015  . Memory loss 12/16/2015  . Muscle cramps at night 12/16/2015  . Dysarthria 12/16/2015  . Expressive aphasia 12/16/2015  . Bereavement 11/18/2015  . Hoarseness 09/22/2015  . Special screening for malignant neoplasms, colon 09/22/2015  . Dyspnea on exertion 07/21/2015  . History of renal cell cancer 09/07/2014  . Urinary incontinence 09/07/2014  . Contact dermatitis 04/08/2014  . Stopped smoking with greater than 25 pack year history 02/25/2014  . Irregular heart beat 01/30/2014  . Allergic rhinitis 01/30/2014  . Medicare annual wellness visit, subsequent 08/08/2012  . Screening for breast cancer 08/08/2012  . Atrophic vaginitis 08/08/2012  . GERD (gastroesophageal reflux disease) 05/07/2012  . History of cardiovascular disorder 04/30/2009  . COLITIS, ULCERATIVE 05/16/2007  . Personal history of malignant neoplasm of breast 05/16/2007   Tilman Neat, SPT This entire  session was performed under direct supervision and direction of a licensed therapist/therapist assistant . I have personally read, edited and approve of the note as written.  Trotter,Margaret PT, DPT 08/31/2016, 4:59 PM  [place] MAIN [facility] SERVICES [address] [place], [place], [address] Phone: [phone]   Fax:  [phone]  Name: Mary Fields MRN: [id number] Date of Birth: 21-Aug-1941

## 2016-09-05 ENCOUNTER — Encounter: Payer: Self-pay | Admitting: Physical Therapy

## 2016-09-05 ENCOUNTER — Ambulatory Visit: Payer: PPO | Admitting: Physical Therapy

## 2016-09-05 DIAGNOSIS — M6281 Muscle weakness (generalized): Secondary | ICD-10-CM

## 2016-09-05 NOTE — Therapy (Signed)
[place] MAIN [facility] SERVICES [address] [place], [place], [address] Phone: [phone]   Fax:  [phone]  Physical Therapy Treatment  Patient Details  Name: Mary Fields MRN: [id number] Date of Birth: 1941/10/02 Referring Provider: Alda Berthold   Encounter Date: 09/05/2016      PT End of Session - 09/05/16 1330    Visit Number 12   Number of Visits 17   Date for PT Re-Evaluation 09/18/16   Authorization Type Gcodes 2/10   PT Start Time 1001   PT Stop Time 1030   PT Time Calculation (min) 29 min   Equipment Utilized During Treatment Gait belt   Activity Tolerance Patient tolerated treatment well   Behavior During Therapy [facility] for tasks assessed/performed      Past Medical History:  Diagnosis Date  . 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 Atypical nevi   . Benign neoplasm of skin, site unspecified    Dr. Aubery Lapping  . Colitis   . 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 Disorder of bone and cartilage, unspecified   . Diverticulosis   . Dizziness and giddiness   . Dyspnea on exertion   . Erosive esophagitis   . Esophageal reflux   . Herpes simplex without mention of complication   . Hip fracture ([place]) 11/12   surgery  . Hx-TIA (transient ischemic attack)   . Malignant neoplasm of kidney, except pelvis   . 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 Other malaise and fatigue   . Palpitations   . Palpitations    a. 07/2015 Echo: EF 60-65%, Gr 1 DD, mild AI.  Marland Kitchen Personal history of malignant neoplasm of breast   . Personal history of other malignant neoplasm of skin   . Tobacco abuse   . Ulcerative colitis, unspecified   . Unspecified asthma(493.90)    since childhood  . Vaginal atrophy   . Vaginal stenosis     Past Surgical History:  Procedure Laterality Date  . ABDOMINAL HYSTERECTOMY  1986   endometriosis, Dr. Pearletha Furl  . APPENDECTOMY    . BREAST LUMPECTOMY Right 1995   right  breast cancer, lumpectomy and XRT and chemo  . COLONOSCOPY  4/10   diverticulosis; recheck 5 years  . ESOPHAGOGASTRODUODENOSCOPY  2002   esophagitis; grade 1, erosive  . HIP FRACTURE SURGERY Right 2005   hemiarthroplasty right, Dr. Francia Greaves  . HIP FRACTURE SURGERY Left 11/12   after fall at church  . MELANOMA EXCISION  1980   Dr. Sharlet Salina  . NEPHRECTOMY  2000   left renal cell carcinoma, Dr. Jacqlyn Larsen    There were no vitals filed for this visit.      Subjective Assessment - 09/05/16 1002    Subjective Pt reports no changes from last time. She states she has been doing a pool activity every day. She states she was able to get into ALS program at [facility] but has not transportation.    Currently in Pain? No/denies     Treatment:  Static stance on 1/2 bolster x30 seconds, increased to vertical and horizontal head movements x10 total, minA for stability, 0 HHA, min VCs for foot placement and to regain balance Instructed patient to move through obstacle course with red mat on the ground, step over 1/2 bolster, step up onto 4" step, and walk over 2 foam surfaces, CGA for safety, min VCs for how to perform exercise and to decrease looking down  at the ground in order to challenge balance Instructed to ambulate over red mat with horizontal head turns, x4, CGA-minA for stability, min VCs for initial set up of exercise Leading foot on dynadisc with trailing leg on airex pad, instructed to perform D2 pattern with 2# weight in each UE. Min VCs to improve movement of UE in pattern, and minA for stability Scapular retraction on matrix, 5# weight while standing on airex pad, feet together, CGA for safety, min VCs and tactile cues to move arms in appropriate motion Alternating aerobic step up/downs; 2x10, instructed to switch LE was leading in order to strengthen each LE, CGA for safety, 0 HHA, min VCs to decrease knee valgus Supine bridges with ABD against red tband, x5, instructed patient about foot placement  but she notes mild discomfort which she states she doesn't have at home.                             PT Education - 09/05/16 1329    Education provided Yes   Education Details performing marches in pool with UE support   Person(s) Educated Patient   Methods Explanation   Comprehension Verbalized understanding             PT Long Term Goals - 08/29/16 0829      PT LONG TERM GOAL #1   Title Patient will be independent in home exercise program to improve strength/mobility for better functional independence with ADLs.   Time 8   Period Weeks   Status On-going     PT LONG TERM GOAL #2   Title Patient (> 62 years old) will complete five times sit to stand test in < 15 seconds indicating an increased LE strength and improved balance.   Time 8   Period Weeks   Status Achieved     PT LONG TERM GOAL #3   Title Patient will tolerate 5 seconds of single leg stance without loss of balance to improve ability to get in and out of shower safely.   Time 8   Period Weeks   Status Partially Met     PT LONG TERM GOAL #4   Title Patient will increase BLE and BUE gross strength to 4+/5 as to improve functional strength for independent gait, increased standing tolerance and increased ADL ability.   Time 8   Period Weeks   Status Partially Met               Plan - 09/05/16 1330    Clinical Impression Statement The patient presented to PT 15 min. late. She denies pain throughout exercises but notes discomfort during supine bridging with hip ABD. The patient requires min VCs to improve exercise technique. She requires CGA during balance activities in order to maintain a safe positioning. The patient would continue to benefit from skilled PT in order to address LE strength, dynamic balance, and improve functional mobility.    Rehab Potential Good   PT Frequency 2x / week   PT Duration 8 weeks   PT Treatment/Interventions Therapeutic exercise;Balance  training;Therapeutic activities   PT Next Visit Plan strengthening and balance training; progress HEP with wall squats and lateral walking with resistance   PT Home Exercise Plan HEP maintained   Consulted and Agree with Plan of Care Patient      Patient will benefit from skilled therapeutic intervention in order to improve the following deficits and impairments:  Abnormal gait, Decreased balance,  Decreased endurance, Decreased strength, Dizziness  Visit Diagnosis: Muscle weakness (generalized)     Problem List Patient Active Problem List   Diagnosis Date Noted  . Suprapubic pain 07/25/2016  . 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 History of melanoma 01/20/2016  . Coagulation test abnormality 01/20/2016  . Hematoma 01/14/2016  . Right hip pain 12/28/2015  . Fall 12/28/2015  . Routine general medical examination at a health care facility 12/16/2015  . Memory loss 12/16/2015  . Muscle cramps at night 12/16/2015  . Dysarthria 12/16/2015  . Expressive aphasia 12/16/2015  . Bereavement 11/18/2015  . Hoarseness 09/22/2015  . Special screening for malignant neoplasms, colon 09/22/2015  . Dyspnea on exertion 07/21/2015  . History of renal cell cancer 09/07/2014  . Urinary incontinence 09/07/2014  . Contact dermatitis 04/08/2014  . Stopped smoking with greater than 25 pack year history 02/25/2014  . Irregular heart beat 01/30/2014  . Allergic rhinitis 01/30/2014  . Medicare annual wellness visit, subsequent 08/08/2012  . Screening for breast cancer 08/08/2012  . Atrophic vaginitis 08/08/2012  . GERD (gastroesophageal reflux disease) 05/07/2012  . History of cardiovascular disorder 04/30/2009  . COLITIS, ULCERATIVE 05/16/2007  . Personal history of malignant neoplasm of breast 05/16/2007   Tilman Neat, SPT This entire session was performed under direct supervision and direction of a licensed therapist/therapist  assistant . I have personally read, edited and approve of the note as written.  Trotter,Margaret PT, DPT 09/05/2016, 3:05 PM  [place] MAIN [facility] SERVICES [address] [place], [place], [address] Phone: [phone]   Fax:  [phone]  Name: Mary Fields MRN: [id number] Date of Birth: October 26, 1940

## 2016-09-07 ENCOUNTER — Ambulatory Visit: Payer: PPO | Admitting: Physical Therapy

## 2016-09-07 ENCOUNTER — Encounter: Payer: Self-pay | Admitting: Physical Therapy

## 2016-09-07 VITALS — BP 121/68 | HR 61

## 2016-09-07 DIAGNOSIS — M6281 Muscle weakness (generalized): Secondary | ICD-10-CM | POA: Diagnosis not present

## 2016-09-07 NOTE — Therapy (Signed)
[place] MAIN [facility] SERVICES [address] [place], [place], [address] Phone: [phone]   Fax:  [phone]  Physical Therapy Treatment  Patient Details  Name: Mary Fields MRN: [id number] Date of Birth: 1940-12-11 Referring Provider: Alda Berthold   Encounter Date: 09/07/2016      PT End of Session - 09/07/16 0844    Visit Number 13   Number of Visits 17   Date for PT Re-Evaluation 09/18/16   Authorization Type Gcodes 3/10   PT Start Time 0806   PT Stop Time 0845   PT Time Calculation (min) 39 min   Equipment Utilized During Treatment Gait belt   Activity Tolerance Patient tolerated treatment well   Behavior During Therapy [facility] for tasks assessed/performed      Past Medical History:  Diagnosis Date  . 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 Atypical nevi   . Benign neoplasm of skin, site unspecified    Dr. Aubery Lapping  . Colitis   . 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 Disorder of bone and cartilage, unspecified   . Diverticulosis   . Dizziness and giddiness   . Dyspnea on exertion   . Erosive esophagitis   . Esophageal reflux   . Herpes simplex without mention of complication   . Hip fracture ([place]) 11/12   surgery  . Hx-TIA (transient ischemic attack)   . Malignant neoplasm of kidney, except pelvis   . 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 Other malaise and fatigue   . Palpitations   . Palpitations    a. 07/2015 Echo: EF 60-65%, Gr 1 DD, mild AI.  Marland Kitchen Personal history of malignant neoplasm of breast   . Personal history of other malignant neoplasm of skin   . Tobacco abuse   . Ulcerative colitis, unspecified   . Unspecified asthma(493.90)    since childhood  . Vaginal atrophy   . Vaginal stenosis     Past Surgical History:  Procedure Laterality Date  . ABDOMINAL HYSTERECTOMY  1986   endometriosis, Dr. Pearletha Furl  . APPENDECTOMY    . BREAST LUMPECTOMY Right 1995   right  breast cancer, lumpectomy and XRT and chemo  . COLONOSCOPY  4/10   diverticulosis; recheck 5 years  . ESOPHAGOGASTRODUODENOSCOPY  2002   esophagitis; grade 1, erosive  . HIP FRACTURE SURGERY Right 2005   hemiarthroplasty right, Dr. Francia Greaves  . HIP FRACTURE SURGERY Left 11/12   after fall at church  . MELANOMA EXCISION  1980   Dr. Sharlet Salina  . NEPHRECTOMY  2000   left renal cell carcinoma, Dr. Jacqlyn Larsen    Vitals:   09/07/16 0811  BP: 121/68  Pulse: 61  SpO2: 95%        Subjective Assessment - 09/07/16 0809    Subjective Pt reports she is doing her exercises 2x/wk and has been going to the pool in between where does fast walking, arm exercises, marching.  No other complaints or concerns.  Pt says today might be her last as she will be going to OT and a [facility] group.  Pt encouraged to attend at least one more session to come up with a plan moving forward, pt agreeable.   Currently in Pain? No/denies       Treatment:   Therapeutic Exercise:  Seated Bil hip ER/Abd with RTB 3x10 with cues for glute activation  Alternating aerobic step up/downs; 2x10, instructed to switch LE was leading  in order to strengthen each LE, CGA for safety, 0 HHA. Pt instructed to count out loud.   Neuromuscular Rehab:  Obstacle course with pt walking over red mat with horizontal and vertical head turns, walking over airex pads, and weaving around cones while counting backward from 100 by 3s with inconsistently correct number. Pt unable to count backward by 7s while doing obstacle course.  Static stance on 1/2 bolster x3 minutes, with horizontal and vertical head movements, minA for stability, 0 HHA, min VCs for foot placement and to regain balance  [place] with eyes closed 3x30 seconds  Leading foot on dynadisc with trailing leg on airex pad, instructed to perform?D2 pattern with?2# weight in each UE. Min VCs to improve movement of UE in pattern, and minA for stability. Pt instructed to count to 10 each UE  out loud.  Tandem walking while listing as many cities as she can. Then listing types of ice cream, colors.                            PT Education - 09/07/16 0811    Education provided Yes   Education Details Exercise Technique    Person(s) Educated Patient   Methods Demonstration;Explanation   Comprehension Verbalized understanding;Need further instruction             PT Long Term Goals - 08/29/16 0829      PT LONG TERM GOAL #1   Title Patient will be independent in home exercise program to improve strength/mobility for better functional independence with ADLs.   Time 8   Period Weeks   Status On-going     PT LONG TERM GOAL #2   Title Patient (> 21 years old) will complete five times sit to stand test in < 15 seconds indicating an increased LE strength and improved balance.   Time 8   Period Weeks   Status Achieved     PT LONG TERM GOAL #3   Title Patient will tolerate 5 seconds of single leg stance without loss of balance to improve ability to get in and out of shower safely.   Time 8   Period Weeks   Status Partially Met     PT LONG TERM GOAL #4   Title Patient will increase BLE and BUE gross strength to 4+/5 as to improve functional strength for independent gait, increased standing tolerance and increased ADL ability.   Time 8   Period Weeks   Status Partially Met               Plan - 09/07/16 0846    Clinical Impression Statement The pt required one seated rest due to fatigue but otherwise tolerated all interventions well this date.  She has difficulty with dual tasking but continues to be motivated.  She would like to attend one more session and then make a decision of if she wants to continue PT while doing OT.  Pt will benefit from continued PT services to improve balance and strength.   Rehab Potential Good   PT Frequency 2x / week   PT Duration 8 weeks   PT Treatment/Interventions Therapeutic exercise;Balance  training;Therapeutic activities   PT Next Visit Plan strengthening and balance training; progress HEP with wall squats and lateral walking with resistance   PT Home Exercise Plan HEP maintained   Consulted and Agree with Plan of Care Patient      Patient will benefit from skilled therapeutic intervention  in order to improve the following deficits and impairments:  Abnormal gait, Decreased balance, Decreased endurance, Decreased strength, Dizziness  Visit Diagnosis: Muscle weakness (generalized)     Problem List Patient Active Problem List   Diagnosis Date Noted  . Suprapubic pain 07/25/2016  . 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 History of melanoma 01/20/2016  . Coagulation test abnormality 01/20/2016  . Hematoma 01/14/2016  . Right hip pain 12/28/2015  . Fall 12/28/2015  . Routine general medical examination at a health care facility 12/16/2015  . Memory loss 12/16/2015  . Muscle cramps at night 12/16/2015  . Dysarthria 12/16/2015  . Expressive aphasia 12/16/2015  . Bereavement 11/18/2015  . Hoarseness 09/22/2015  . Special screening for malignant neoplasms, colon 09/22/2015  . Dyspnea on exertion 07/21/2015  . History of renal cell cancer 09/07/2014  . Urinary incontinence 09/07/2014  . Contact dermatitis 04/08/2014  . Stopped smoking with greater than 25 pack year history 02/25/2014  . Irregular heart beat 01/30/2014  . Allergic rhinitis 01/30/2014  . Medicare annual wellness visit, subsequent 08/08/2012  . Screening for breast cancer 08/08/2012  . Atrophic vaginitis 08/08/2012  . GERD (gastroesophageal reflux disease) 05/07/2012  . History of cardiovascular disorder 04/30/2009  . COLITIS, ULCERATIVE 05/16/2007  . Personal history of malignant neoplasm of breast 05/16/2007    Collie Siad PT, DPT 09/07/2016, 8:49 AM  [place] MAIN [facility] SERVICES [address] [place], [place], [address] Phone: [phone]   Fax:  [phone]  Name: Mary Fields MRN: [id number] Date of Birth: November 17, 1940

## 2016-09-10 IMAGING — CR DG CHEST 2V
1 series · 2 of 2 positions shown · non-contrast
Comparison: 10/02/2012

CLINICAL DATA: Shortness of breath.

EXAM:
CHEST  2 VIEW

[Series 1: w chest pa · 0.14mm/px · 2 of 2 slices shown]
[im 1/2]
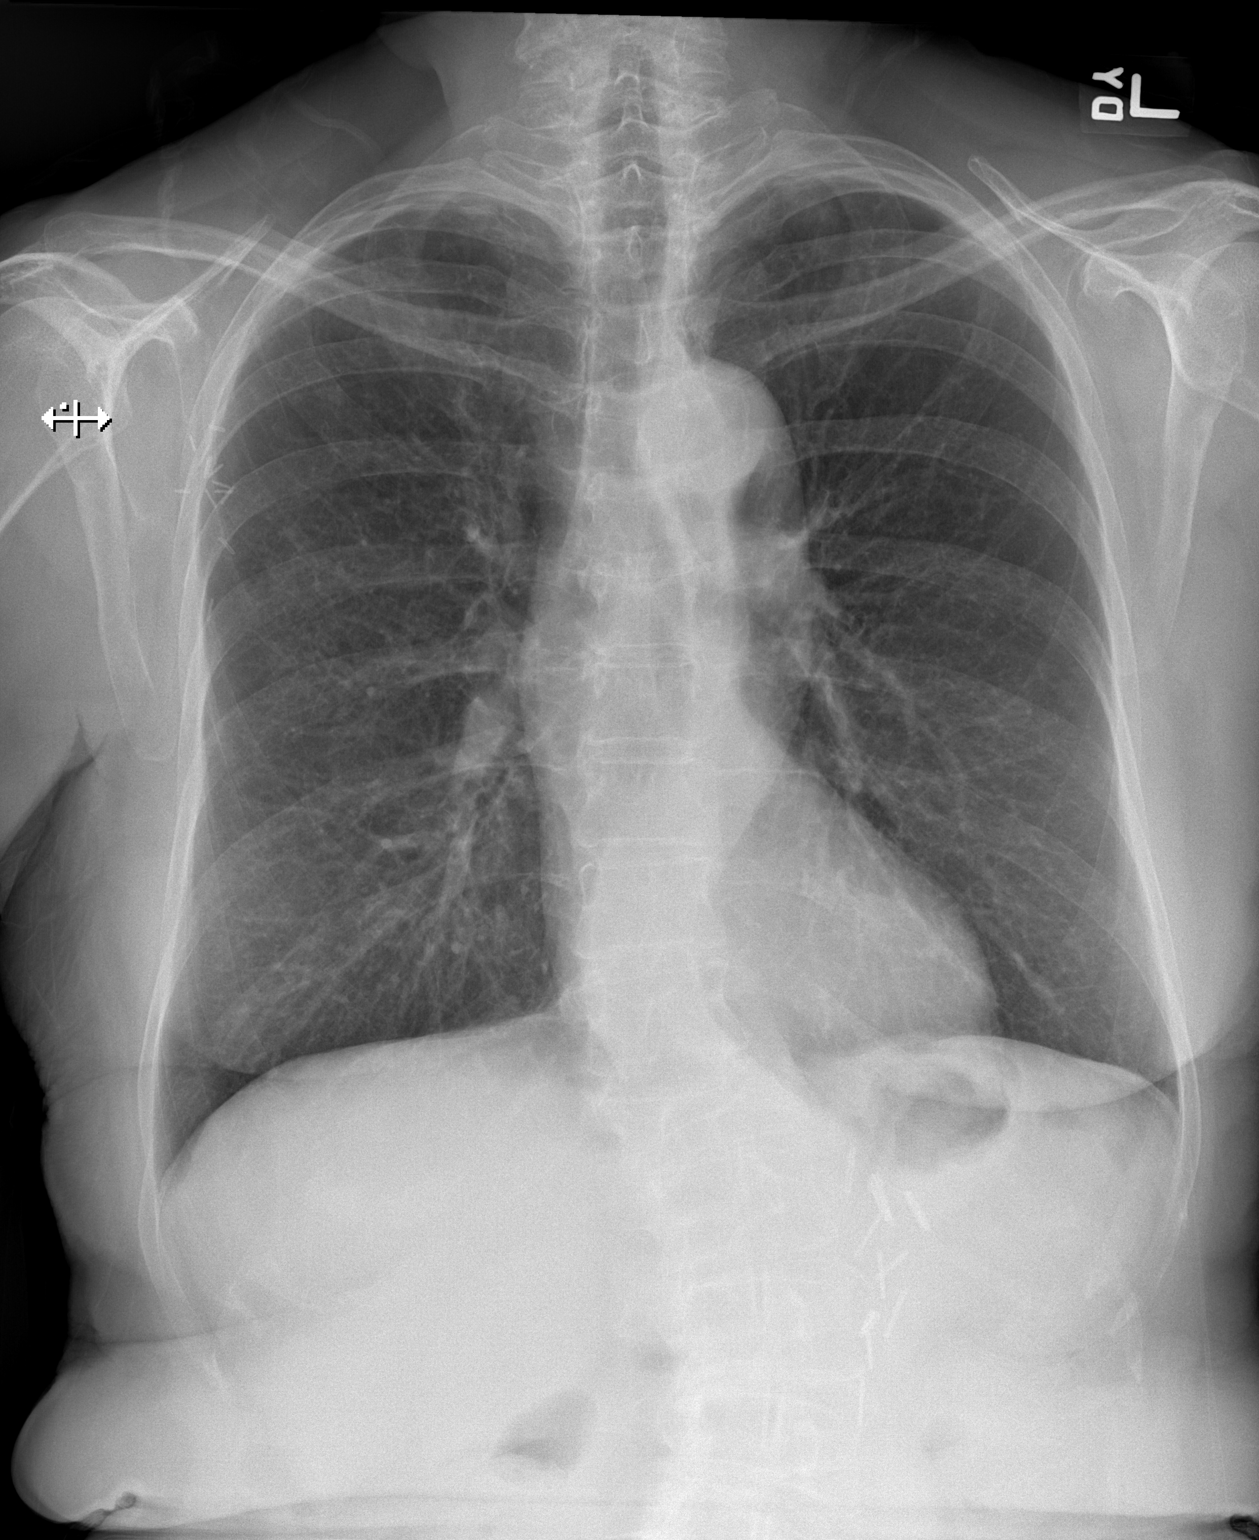
[im 2/2]
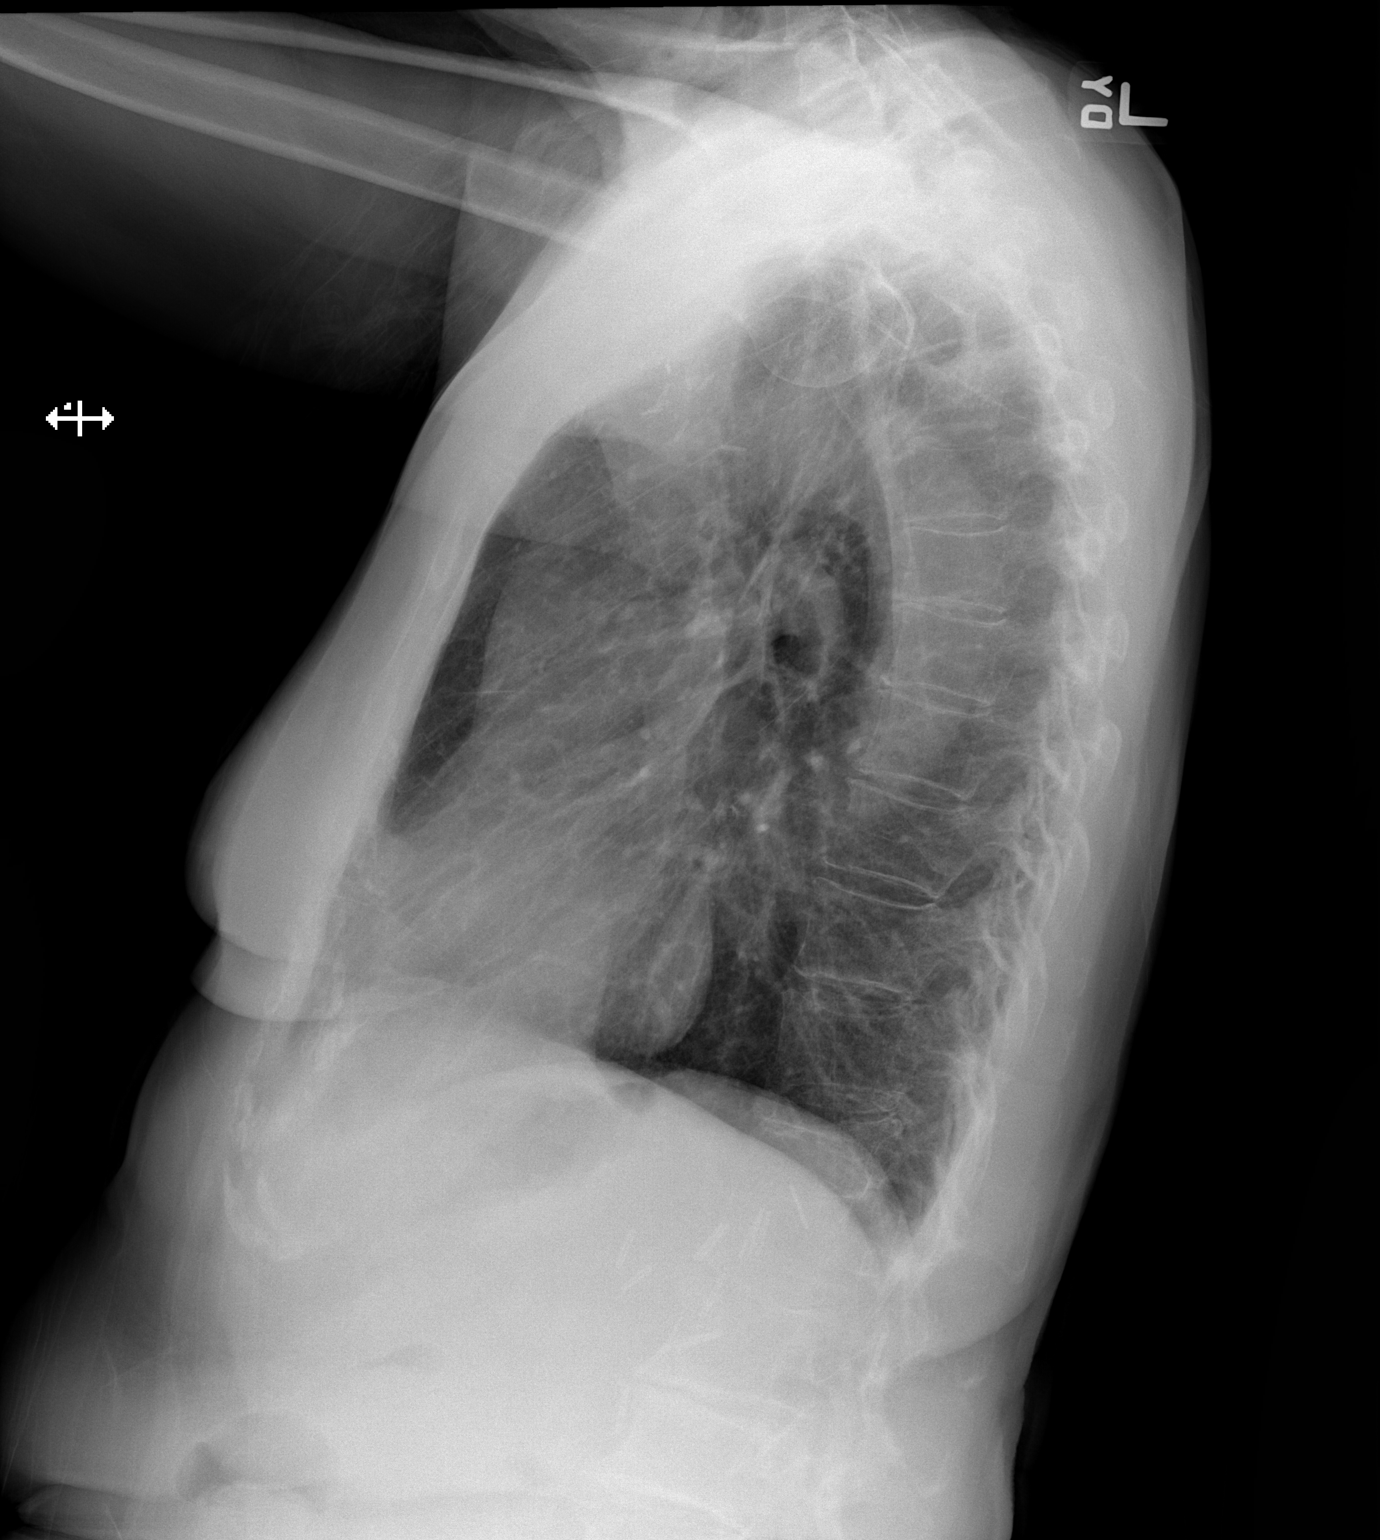

[2 of 2 positions shown; findings below may reference images not displayed]

FINDINGS: Heart size is normal. The aorta is unfolded. The lungs are clear. No
effusions. Curvature of the spine with old thoracolumbar compression
deformity as seen previously. Surgical clips in the right axilla and
the upper abdominal retroperitoneum. Pulmonary vascularity is
normal.
IMPRESSION: Unfolded aorta.  No active cardiopulmonary disease by radiography.

## 2016-09-11 DIAGNOSIS — G1221 Amyotrophic lateral sclerosis: Secondary | ICD-10-CM | POA: Diagnosis not present

## 2016-09-12 ENCOUNTER — Ambulatory Visit: Payer: PPO | Admitting: Speech Pathology

## 2016-09-12 ENCOUNTER — Ambulatory Visit: Payer: PPO | Admitting: Physical Therapy

## 2016-09-12 DIAGNOSIS — Z79899 Other long term (current) drug therapy: Secondary | ICD-10-CM | POA: Diagnosis not present

## 2016-09-12 DIAGNOSIS — M6281 Muscle weakness (generalized): Secondary | ICD-10-CM | POA: Diagnosis not present

## 2016-09-12 DIAGNOSIS — R471 Dysarthria and anarthria: Secondary | ICD-10-CM | POA: Diagnosis not present

## 2016-09-12 DIAGNOSIS — R1312 Dysphagia, oropharyngeal phase: Secondary | ICD-10-CM | POA: Diagnosis not present

## 2016-09-12 DIAGNOSIS — E559 Vitamin D deficiency, unspecified: Secondary | ICD-10-CM | POA: Diagnosis not present

## 2016-09-12 DIAGNOSIS — R0602 Shortness of breath: Secondary | ICD-10-CM | POA: Diagnosis not present

## 2016-09-12 DIAGNOSIS — G1221 Amyotrophic lateral sclerosis: Secondary | ICD-10-CM | POA: Diagnosis not present

## 2016-09-12 DIAGNOSIS — J984 Other disorders of lung: Secondary | ICD-10-CM | POA: Diagnosis not present

## 2016-09-12 DIAGNOSIS — Z5181 Encounter for therapeutic drug level monitoring: Secondary | ICD-10-CM | POA: Diagnosis not present

## 2016-09-12 DIAGNOSIS — R262 Difficulty in walking, not elsewhere classified: Secondary | ICD-10-CM | POA: Diagnosis not present

## 2016-09-12 DIAGNOSIS — Z87891 Personal history of nicotine dependence: Secondary | ICD-10-CM | POA: Diagnosis not present

## 2016-09-12 DIAGNOSIS — G709 Myoneural disorder, unspecified: Secondary | ICD-10-CM | POA: Diagnosis not present

## 2016-09-19 ENCOUNTER — Encounter: Payer: Self-pay | Admitting: Physical Therapy

## 2016-09-19 ENCOUNTER — Ambulatory Visit: Payer: PPO | Admitting: Physical Therapy

## 2016-09-19 ENCOUNTER — Other Ambulatory Visit: Payer: Self-pay | Admitting: *Deleted

## 2016-09-19 DIAGNOSIS — G1221 Amyotrophic lateral sclerosis: Secondary | ICD-10-CM

## 2016-09-19 DIAGNOSIS — M6281 Muscle weakness (generalized): Secondary | ICD-10-CM

## 2016-09-19 NOTE — Therapy (Signed)
[place] MAIN [facility] SERVICES [address] [place], [place], [address] Phone: [phone]   Fax:  [phone]  Physical Therapy Treatment/Progress Note  Patient Details  Name: Mary Fields MRN: [id number] Date of Birth: [birth year] Referring Provider: Alda Berthold   Encounter Date: 09/19/2016      PT End of Session - 09/19/16 1027    Visit Number 14   Number of Visits 26   Date for PT Re-Evaluation 10/17/16   Authorization Type Gcodes 4/10   PT Start Time 1020   PT Stop Time 1100   PT Time Calculation (min) 40 min   Equipment Utilized During Treatment Gait belt   Activity Tolerance Patient limited by fatigue   Behavior During Therapy [facility] for tasks assessed/performed      Past Medical History:  Diagnosis Date  . 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 Atypical nevi   . Benign neoplasm of skin, site unspecified    Dr. Aubery Lapping  . Colitis   . 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 Disorder of bone and cartilage, unspecified   . Diverticulosis   . Dizziness and giddiness   . Dyspnea on exertion   . Erosive esophagitis   . Esophageal reflux   . Herpes simplex without mention of complication   . Hip fracture ([place]) 11/12   surgery  . Hx-TIA (transient ischemic attack)   . Malignant neoplasm of kidney, except pelvis   . 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 Other malaise and fatigue   . Palpitations   . Palpitations    a. 07/2015 Echo: EF 60-65%, Gr 1 DD, mild AI.  Marland Kitchen Personal history of malignant neoplasm of breast   . Personal history of other malignant neoplasm of skin   . Tobacco abuse   . Ulcerative colitis, unspecified   . Unspecified asthma(493.90)    since childhood  . Vaginal atrophy   . Vaginal stenosis     Past Surgical History:  Procedure Laterality Date  . ABDOMINAL HYSTERECTOMY  1986   endometriosis, Dr. Pearletha Furl  . APPENDECTOMY    . BREAST LUMPECTOMY Right 1995    right breast cancer, lumpectomy and XRT and chemo  . COLONOSCOPY  4/10   diverticulosis; recheck 5 years  . ESOPHAGOGASTRODUODENOSCOPY  2002   esophagitis; grade 1, erosive  . HIP FRACTURE SURGERY Right 2005   hemiarthroplasty right, Dr. Francia Greaves  . HIP FRACTURE SURGERY Left 11/12   after fall at church  . MELANOMA EXCISION  1980   Dr. Sharlet Salina  . NEPHRECTOMY  2000   left renal cell carcinoma, Dr. Jacqlyn Larsen    There were no vitals filed for this visit.      Subjective Assessment - 09/19/16 1026    Subjective Patient reports that she is not eating well. She reports that this is her biggest compliant. "I went out for breakfast but couldn't eat half of it." She reports feeling that part of her problem is loss of apetite and also trouble swallowing.    Currently in Pain? No/denies        TREATMENT: Warm up on Nustep BUE/BLE level 2 x4 min (unbilled);   Strength at Initial eval on 07/24/16: STRENGTH:  Graded on a 0-5 scale Muscle Group Left Right  Shoulder flex 3+/5 3+/5  Shoulder Abd 3+/5 3+/5  Shoulder Ext 3/5 3/5  Shoulder IR/ER    Elbow 4/5 4/5  Grip Strength 10lbs 12lbs  Hip Flex  3+/5 3+/5  Hip Abd 2/5 2/5  Hip Add 2/5 2/5  Hip Ext    Hip IR/ER    Knee Flex 5/5 5/5  Knee Ext 5/5 5/5  Ankle DF 4/5 4/5  Ankle PF       Strength today: STRENGTH:  Graded on a 0-5 scale Muscle Group Left Right  Shoulder flex 4/5 4/5  Shoulder Abd 3+/5 3+/5  Shoulder Ext 3+/5 3+/5  Shoulder IR/ER    Elbow 4/5 4/5  Grip Strength 12 lbs 10 lbs  Hip Flex 3+/5 3/5  Hip Abd 3/5 3/5  Hip Add 3/5 3/5  Hip Ext    Hip IR/ER    Knee Flex 4+/5 4+/5  Knee Ext 5/5 5/5  Ankle DF 4/5 4/5  Ankle PF 4/5 4/5       5 times sit<>Stand 15 sec without HHA (<15 sec indicates low fall risk, more impaired since evaluation on 07/24/16 which was 11 sec)    Patient able to maintain SLS on RLE: 4 sec, LLE 2 sec consistently; she demonstrates increased lateral trunk sway with  SLS;  Instructed patient in advanced balance exercise: Standing on upside down 1/2 bolster: Heel/toe raise ankle movement x1 min with rail assist with cues to avoid hip compensation and focus on LE ankle movement; Balance with BUE wand flexion x10 with min A for balance;  Forward/backward step over 1/2 bolster x10 without rail assist; Side step over 1/2 bolster x10 without rail assist; Patient required close supervision; She demonstrates increased veering and lateral trunk sway with stepping over bolster;  Star toe taps x1 each LE with mod VCs for correct technique and to improve core stabilization for better trunk control; Required min A for balance;                   PT Education - 09/19/16 1059    Education provided Yes   Education Details progress towards goals, balance exercise, HEP review;    Person(s) Educated Patient   Methods Explanation;Verbal cues   Comprehension Verbalized understanding;Returned demonstration;Verbal cues required             PT Long Term Goals - 09/19/16 1041      PT LONG TERM GOAL #1   Title Patient will be independent in home exercise program to improve strength/mobility for better functional independence with ADLs.   Time 4   Period Weeks   Status On-going     PT LONG TERM GOAL #2   Title Patient (> 57 years old) will complete five times sit to stand test in < 15 seconds indicating an increased LE strength and improved balance.   Time 4   Period Weeks   Status Achieved     PT LONG TERM GOAL #3   Title Patient will tolerate 5 seconds of single leg stance without loss of balance to improve ability to get in and out of shower safely.   Time 4   Period Weeks   Status Partially Met     PT LONG TERM GOAL #4   Title Patient will increase BLE and BUE gross strength to 4+/5 as to improve functional strength for independent gait, increased standing tolerance and increased ADL ability.   Time 4   Period Weeks   Status Partially Met                Plan - 09/19/16 1059    Clinical Impression Statement patient reports being most concerned with eating and with fine motor  skills of hands. She reports having a decreased apetite and decreased swalling ability. As a result, she reports that she has not been eating enough. Patient also reports having trouble with buttoning and unuttoning her pants. She reports having trouble with fine motor skills of her hands. Therefore she would benefit from a referral for speech therapy to address swallowing and occupational therapy to address hand dexterity. Today PT re-assessed goals. Patient continues to have weakness in BUE and BLE. She demonstrates increased weakness in BLE hip as compared to evaluation. She also reports not feeling confident when walking alone. she demonstrates impaired balance with slower sit<>Stand ability and with SLS ability. She would benefit from additional skilled PT intervention to improve balance/gait safety and return to PLOF.    Rehab Potential Good   PT Frequency 2x / week   PT Duration 4 weeks   PT Treatment/Interventions Therapeutic exercise;Balance training;Therapeutic activities   PT Next Visit Plan strengthening and balance training; progress HEP with wall squats and lateral walking with resistance   PT Home Exercise Plan HEP maintained   Consulted and Agree with Plan of Care Patient      Patient will benefit from skilled therapeutic intervention in order to improve the following deficits and impairments:  Abnormal gait, Decreased balance, Decreased endurance, Decreased strength, Dizziness  Visit Diagnosis: Muscle weakness (generalized) - Plan: PT plan of care cert/re-cert     Problem List Patient Active Problem List   Diagnosis Date Noted  . Suprapubic pain 07/25/2016  . 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 History of melanoma 01/20/2016  . Coagulation test abnormality 01/20/2016   . Hematoma 01/14/2016  . Right hip pain 12/28/2015  . Fall 12/28/2015  . Routine general medical examination at a health care facility 12/16/2015  . Memory loss 12/16/2015  . Muscle cramps at night 12/16/2015  . Dysarthria 12/16/2015  . Expressive aphasia 12/16/2015  . Bereavement 11/18/2015  . Hoarseness 09/22/2015  . Special screening for malignant neoplasms, colon 09/22/2015  . Dyspnea on exertion 07/21/2015  . History of renal cell cancer 09/07/2014  . Urinary incontinence 09/07/2014  . Contact dermatitis 04/08/2014  . Stopped smoking with greater than 25 pack year history 02/25/2014  . Irregular heart beat 01/30/2014  . Allergic rhinitis 01/30/2014  . Medicare annual wellness visit, subsequent 08/08/2012  . Screening for breast cancer 08/08/2012  . Atrophic vaginitis 08/08/2012  . GERD (gastroesophageal reflux disease) 05/07/2012  . History of cardiovascular disorder 04/30/2009  . COLITIS, ULCERATIVE 05/16/2007  . Personal history of malignant neoplasm of breast 05/16/2007    Trotter,Margaret PT, DPT 09/19/2016, 11:05 AM  [place] MAIN [facility] SERVICES [address] [place], [place], [address] Phone: [phone]   Fax:  [phone]  Name: Mary Fields MRN: [id number] Date of Birth: 1941-02-26

## 2016-09-19 NOTE — Progress Notes (Signed)
Referral placed.

## 2016-09-20 ENCOUNTER — Encounter: Payer: Self-pay | Admitting: Neurology

## 2016-09-20 ENCOUNTER — Ambulatory Visit (INDEPENDENT_AMBULATORY_CARE_PROVIDER_SITE_OTHER): Payer: PPO | Admitting: Neurology

## 2016-09-20 VITALS — BP 120/70 | HR 106 | Ht 66.0 in | Wt 135.1 lb

## 2016-09-20 DIAGNOSIS — G1221 Amyotrophic lateral sclerosis: Secondary | ICD-10-CM

## 2016-09-20 NOTE — Patient Instructions (Addendum)
1. Reduce tizanidine to 2mg  at bedtime 2. Continue Riluzole 50mg  twice daily.  Recommend having your lab testing with your visit with Dr. Hosie Poisson 3. Start occupational therapy and speech therapy  Return to clinic in 4 months

## 2016-09-20 NOTE — Progress Notes (Signed)
Follow-up Visit   Date: 09/20/16   Mary Fields MRN: [id number] DOB: 01-27-1941   Interim History: Mary Fields is a 75 y.o. right-handed Caucasian female with GERD and history of melanoma, breast cancer and kidney cancer returning to the clinic for follow-up of amyotrophic lateral sclerosis.  The patient was accompanied to the clinic by friend who also provides collateral information.    History of present illness: Starting around early 2017, she began noticing speech changes and she was unable to sing. She was singing in a choir for 45+ years and then suddenly was unable to vocalize higher pitches.  Her voice has become deeper and slower, making speech production harder.  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  She also complains of new shortness of breath over the same time.  She is seeing Dr. Jamal Collin, pulmonologist.  She went to see ENT whose evaluation was normal and then referred to my collegue, Dr. Carles Collet.  She underwent extensive neurological testing including MRI brain, MRI cervical spine, NCS/EMG and MBS.  Her MRI did not show any structural disease or compressive myelopathy to explain symptoms.  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  She denies having problem with swallowing, but she has MBS which showed mild oropharyngeal dysphasia and recommended to take pulls with puree.  Since doing this, her pills are a lot easier to swallow.  She also has painful cramps of the hands and calf.  She has tried tizanidine 23m at bedtime, but has not noticed a significant change.  She has some twiches over the legs.   She has difficulty with grip, such as hanging her pants and trying to manipulate the clip.  She has a jar opener which  helps with opening bottles and jars.  She has no weakness of the legs and is able to climb 4 flights of stairs without getting tired.   There are spells of emotional lability especially when she sees commercials or hears beautiful classical music.  At church, she cries very easily at the music and it causes her social embarrassment.   UPDATE 07/19/2016:  Patient presents for sooner appointment to discuss questions regarding her diagnosis of ALS, prognosis, and medications.  She has noticed significant benefit with Nuedexta, but is not complaint with taking it daily only 1 tablet as needed; in fact, she was able to go to church and did not cry.  She would like to start Rilutek and had many questions about this medication.  She is worried about the progression of her disease and states that she would like to stay in her home for as long as possible.  I reassured her that we can arrange for help at home, when the need comes.  She has read about research with ALS and stem cell transplant and was inquiring whether we do it here, which we do not.  [facility]  UPDATE 09/20/2016:  [facility]. She endorses slight worsening of dyphagia and shortness of breath with exertions.  She is using chin tuck positioning, which helps. She is living independently and does not have any problems with day-to-day activities.  She  walks unassisted.  Her FVC was 43% and recommended to start using non-invasive ventilation, but she has not received any further communication regarding this.  She complains of increased sleepiness when she increased her tizanidine to 64m twice daily.  Her muscle cramps are worse at night time and she would like to reduce it back to 245mat bedtime.  She denies any new weakness of the arms or legs.  She was doing well on Neudexta, but it is too expensive so stopped this.  Medications:  Current Outpatient  Prescriptions on File Prior to Visit  Medication Sig Dispense Refill  . acyclovir (ZOVIRAX) 800 MG tablet Take 1 tablet (800 mg total) by mouth 2 (two) times daily. 10 tablet 3  . aspirin 81 MG tablet Take 81 mg by mouth every other day.     . cephALEXin (KEFLEX) 500 MG capsule Take 1 capsule (500 mg total) by mouth 2 (two) times daily. 14 capsule 0  . Dextromethorphan-Quinidine (NUEDEXTA) 20-10 MG CAPS Take 20 mg by mouth 2 (two) times daily. 60 capsule 11  . diazepam (VALIUM) 5 MG tablet Take one tablet 30 minutes prior to MR (on arrival to facility) and one more just prior to MR (if needed) 2 tablet 0  . Estrogens, Conjugated (PREMARIN VA) Place 1 g vaginally once a week.    . Fluticasone Furoate-Vilanterol (BREO ELLIPTA) 100-25 MCG/INH AEPB Inhale 1 puff into the lungs daily. 1 each 11  . Multiple Vitamin (MULTIVITAMIN) tablet Take 1 tablet by mouth daily.      . NON FORMULARY Ease Takes 1 tablet by mouth daily.    . pantoprazole (PROTONIX) 40 MG tablet Take 1 tablet (40 mg total) by mouth daily. 90 tablet 3  . PREMARIN vaginal cream PLACE 1/2GM VAGINALLY TWICE A WEEK 30 g 1  . Propylene Glycol (SYSTANE BALANCE OP) 1 drop daily.     . ranitidine (ZANTAC) 300 MG tablet Take 1 tablet (300 mg total) by mouth at bedtime. (Patient taking differently: Take 300 mg by mouth at bedtime as needed. ) 30 tablet 6  . riluzole (RILUTEK) 50 MG tablet Take 1 tablet daily for one week, then increase to 1 tablet twice daily (Patient taking differently: 100 mg. Take 1 tablet daily for one week, then increase to 1 tablet twice daily) 60 tablet 11  . tizanidine (ZANAFLEX) 2 MG capsule Take 1 capsule (2 mg total) by mouth 2 (two) times daily. 60 capsule 2  . vitamin C (ASCORBIC ACID) 500 MG tablet Take 1 tablet (500 mg total) by mouth daily. 30 tablet 0   No current facility-administered medications on file prior to visit.     Allergies:  Allergies  Allergen Reactions  . Prednisone Hives  . Sulfonamide  Derivatives Other (See Comments)    Skin whelps  . Meperidine Other (See Comments)    Unspecified  . Ciprofloxacin Diarrhea    REACTION: diarrhea    Review of Systems:  CONSTITUTIONAL: No fevers, chills, night sweats, or weight loss.  EYES: No visual changes or eye pain ENT: No hearing changes.  No history of nose bleeds.   RESPIRATORY: No cough, wheezing and shortness of breath.   CARDIOVASCULAR: Negative for chest pain, and palpitations.   GI: Negative for abdominal discomfort, blood in stools or black stools.  No recent change in bowel habits.   GU:  No history of incontinence.   MUSCLOSKELETAL: No history of joint pain or swelling.  No myalgias.   SKIN: Negative for lesions, rash,  and itching.   ENDOCRINE: Negative for cold or heat intolerance, polydipsia or goiter.   PSYCH:  No depression or anxiety symptoms.   NEURO: As Above.   Vital Signs:  BP 120/70   Pulse (!) 106   Ht _0  (1.676 m)   Wt 135 lb 2 oz (61.3 kg)   SpO2 96%   BMI 21.81 kg/m   Neurological Exam: MENTAL STATUS including orientation to time, place, person, recent and remote memory, attention span and concentration, language, and fund of knowledge is normal.  Speech is mildly dysarthric with spastic nature.  CRANIAL NERVES:  Pupils equal round and reactive to light.  Normal conjugate, extra-ocular eye movements in all directions of gaze.  No ptosis. Face is symmetric, fasciculations present over the mentalis and orbicularis oris muscles. Palate elevates symmetrically.  Tongue is midline and there are active fasciculations.      MOTOR: There is moderate atrophy of the intrinsic hand muscles.  Active fasciculations are seen over the biceps, triceps, forearm muscles, rare fasciculations are present in the lower extremities.    Tone is normal.   Neck flexion is 5-/5 Right Upper Extremity:    Left Upper Extremity:    Deltoid  4/5   Deltoid  4/5   Biceps  5/5   Biceps  5/5   Triceps  5-/5   Triceps   4+/5   Wrist extensors  5-/5   Wrist extensors  5-/5   Wrist flexors  5-/5   Wrist flexors  5-/5   Finger extensors  5/5   Finger extensors  5/5   Finger flexors  5/5   Finger flexors  5/5   Dorsal interossei  4/5   Dorsal interossei  4/5   Abductor pollicis  4/5   Abductor pollicis  4/5   Tone ([place] scale)  0  Tone ([place]worth scale)  0   Right Lower Extremity:    Left Lower Extremity:    Hip flexors  4/5   Hip flexors  4/5   Hip extensors  5/5   Hip extensors  5/5   Knee flexors  5/5   Knee flexors  5/5   Knee extensors  5/5   Knee extensors  5/5   Dorsiflexors  5/5   Dorsiflexors  5/5   Plantarflexors  5/5   Plantarflexors  5/5   Toe extensors  5/5   Toe extensors  5/5   Toe flexors  5/5   Toe flexors  5/5   Tone (Ashworth scale)  0  Tone (Ashworth scale)  0   COORDINATION/GAIT: Unable to rise from a chair without using arms (worse).  Gait narrow based and stable.   Data: MRI cervical spine wo contrast 06/13/2016:  No finding to explain the patient's symptoms. The cervical cord appears normal. Mild multilevel spondylosis without central canal narrowing is identified. There is mild to moderate foraminal narrowing at C5-6, worse on the left.  MRI brain wwo contrast 02/16/2016:  No acute or reversible finding. Mild generalized brain atrophy with mild to moderate chronic small-vessel ischemic changes of the cerebral hemispheric white matter, slightly progressive since 2007.  MBS 05/18/2016:  Mild oral phase dysphasia and reduced lingual coordination and control of liquid boluses  NCS/EMG of the right side 05/02/2016:  In summary, the above findings are consistent with active on chronic cervical intraspinal lesions affecting the right C8-T1 roots/segments, which is mildin degree electrically. Taking into account the presence of active denervation involving the bulbar and tibialis anterior muscles, these  collective findings may suggest an early and widespread disorder  of anterior horn cells, as seen in motor neuron disease. Clinical correlation recommended.  Labs 12/16/2015:  TSH 1.27, vitamin B12 374, ANA positive, ceruloplasmin 23, ferritin 168, celiac panel negative, ESR 8, RPR NR, heavy metal screen  Labs 07/05/2016:  SPEP/UPEP with IFE no M protein, CK 88, aldolase 4.1, CRP 0.1, copper 102   IMPRESSION: Ms. Marsch is a 75 year-old female returning with bulbar onset ALS.  She is also being followed by [facility] whose support is appreciated. Clinically, there is not a marked change since she was last here.   Her bulbar weakness is certainly affecting her voice and swallowing.  She seems to be doing well with chin-tuck positioning.  I recommended adding a thickner to liquids and maintain soft food diet.  She does not wish to have PEG/tracheostomy.    Because of increased sedation, I will reduce her tizanidine to 33m at bedtime only. Continue riluzole 565mtwice daily.  She will be due for lab testing at her next visit with Dr. BeHosie Poissonn December Recommend contacting Dr. ChJeannine Kittenffice regarding noninvasive ventilation options For PBA, we will inquire about financial resources for Nuedexta, since this worked well for her  Referral will be placed to start occupational therapy and speech therapy  Return to clinic in 4 months  The duration of this appointment visit was 30 minutes of face-to-face time with the patient.  Greater than 50% of this time was spent in counseling, explanation of diagnosis, planning of further management, and coordination of care.   Thank you for allowing me to participate in patient's care.  If I can answer any additional questions, I would be pleased to do so.    Sincerely,    Domenick Quebedeaux K. PaPosey ProntoDO

## 2016-09-21 ENCOUNTER — Ambulatory Visit: Payer: PPO | Admitting: Physical Therapy

## 2016-09-21 ENCOUNTER — Encounter: Payer: Self-pay | Admitting: Physical Therapy

## 2016-09-21 DIAGNOSIS — M6281 Muscle weakness (generalized): Secondary | ICD-10-CM | POA: Diagnosis not present

## 2016-09-21 NOTE — Therapy (Signed)
[place] MAIN [facility] SERVICES [address] [place], [place], [address] Phone: [phone]   Fax:  [phone]  Physical Therapy Treatment  Patient Details  Name: Mary Fields MRN: [id number] Date of Birth: 05-Sep-1941 Referring Provider: Alda Berthold   Encounter Date: 09/21/2016      PT End of Session - 09/21/16 1028    Visit Number 15   Number of Visits 26   Date for PT Re-Evaluation 10/17/16   Authorization Type Gcodes 5/10   PT Start Time 1020   PT Stop Time 1100   PT Time Calculation (min) 40 min   Equipment Utilized During Treatment Gait belt   Activity Tolerance Patient limited by fatigue   Behavior During Therapy [facility] for tasks assessed/performed      Past Medical History:  Diagnosis Date  . 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 Atypical nevi   . Benign neoplasm of skin, site unspecified    Dr. Aubery Lapping  . Colitis   . 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 Disorder of bone and cartilage, unspecified   . Diverticulosis   . Dizziness and giddiness   . Dyspnea on exertion   . Erosive esophagitis   . Esophageal reflux   . Herpes simplex without mention of complication   . Hip fracture ([place]) 11/12   surgery  . Hx-TIA (transient ischemic attack)   . Malignant neoplasm of kidney, except pelvis   . 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 Other malaise and fatigue   . Palpitations   . Palpitations    a. 07/2015 Echo: EF 60-65%, Gr 1 DD, mild AI.  Marland Kitchen Personal history of malignant neoplasm of breast   . Personal history of other malignant neoplasm of skin   . Tobacco abuse   . Ulcerative colitis, unspecified   . Unspecified asthma(493.90)    since childhood  . Vaginal atrophy   . Vaginal stenosis     Past Surgical History:  Procedure Laterality Date  . ABDOMINAL HYSTERECTOMY  1986   endometriosis, Dr. Pearletha Furl  . APPENDECTOMY    . BREAST LUMPECTOMY Right 1995   right breast  cancer, lumpectomy and XRT and chemo  . COLONOSCOPY  4/10   diverticulosis; recheck 5 years  . ESOPHAGOGASTRODUODENOSCOPY  2002   esophagitis; grade 1, erosive  . HIP FRACTURE SURGERY Right 2005   hemiarthroplasty right, Dr. Francia Greaves  . HIP FRACTURE SURGERY Left 11/12   after fall at church  . MELANOMA EXCISION  1980   Dr. Sharlet Salina  . NEPHRECTOMY  2000   left renal cell carcinoma, Dr. Jacqlyn Larsen    There were no vitals filed for this visit.      Subjective Assessment - 09/21/16 1027    Subjective Patient reports that she is frustrated that the pool at [facility] is closed. She states, "I need the pool but its closed." She reports having a good appt with her physician this week. She will be continuing with physical therapy;    Currently in Pain? No/denies         Treatment: Warm up on Nustep BUE/BLE level 2 x5 min (unbilled);  Standing on airex balance beam: Side stepping with 1 rail assist x3 laps with cues to keep head forward; Forward standing with feet apart: Balloon taps with each UE x3-4 min with cues to improve trunk control for better balance; Tandem stance with BUE ball pass side/side x10 each foot in front with  min A for balance and mod VCs to use mirror for visual cues to improve trunk control;  Standing with red tband around both legs: Hip abduction x15 bilaterally Hip extension x15 bilaterally  Patient required min-moderate verbal/tactile cues for correct exercise technique including positioning and technique;  Resisted gait: 7.5# forward/backward, side/side 4 way, x2 laps each direction with min A for balance and mod VCs for better weight shift;  Gait in hallway: Forward walking with ball toss x100 feet with min VCs to increase step length and improve speed for better balance control; Forward walking with lateral head turns with cognitive challenge of calling out cards on wall for dual tasks; Patient has trouble doing dual tasks with slower gait speed;  Attempted  sit<>stand with 4#weighted ball overhead press x2 reps with min A; Patient reports increased RLE discomfort due to weakness;                           PT Education - 09/21/16 1027    Education provided Yes   Education Details balance exercise, strengthening;    Person(s) Educated Patient   Methods Explanation;Verbal cues   Comprehension Verbalized understanding;Returned demonstration;Verbal cues required             PT Long Term Goals - 09/19/16 1041      PT LONG TERM GOAL #1   Title Patient will be independent in home exercise program to improve strength/mobility for better functional independence with ADLs.   Time 4   Period Weeks   Status On-going     PT LONG TERM GOAL #2   Title Patient (> 87 years old) will complete five times sit to stand test in < 15 seconds indicating an increased LE strength and improved balance.   Time 4   Period Weeks   Status Achieved     PT LONG TERM GOAL #3   Title Patient will tolerate 5 seconds of single leg stance without loss of balance to improve ability to get in and out of shower safely.   Time 4   Period Weeks   Status Partially Met     PT LONG TERM GOAL #4   Title Patient will increase BLE and BUE gross strength to 4+/5 as to improve functional strength for independent gait, increased standing tolerance and increased ADL ability.   Time 4   Period Weeks   Status Partially Met               Plan - 09/21/16 1100    Clinical Impression Statement Patient instructed in advanced balance/strengthening exercise. She demonstrates better weight shift and balance when standing on uneven surfaces. However during tandem stance patient had increased difficulty; She also continues to look down with gait and has trouble keeping erect posture for better balance challenge. Patient would benefit from additional skilled PT intervention to improve balance/gait safety and reduce fall risk;    Rehab Potential Good   PT  Frequency 2x / week   PT Duration 4 weeks   PT Treatment/Interventions Therapeutic exercise;Balance training;Therapeutic activities   PT Next Visit Plan strengthening and balance training; progress HEP with wall squats and lateral walking with resistance   PT Home Exercise Plan HEP maintained   Consulted and Agree with Plan of Care Patient      Patient will benefit from skilled therapeutic intervention in order to improve the following deficits and impairments:  Abnormal gait, Decreased balance, Decreased endurance, Decreased strength, Dizziness  Visit Diagnosis: Muscle weakness (generalized)     Problem List Patient Active Problem List   Diagnosis Date Noted  . Suprapubic pain 07/25/2016  . 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 History of melanoma 01/20/2016  . Coagulation test abnormality 01/20/2016  . Hematoma 01/14/2016  . Right hip pain 12/28/2015  . Fall 12/28/2015  . Routine general medical examination at a health care facility 12/16/2015  . Memory loss 12/16/2015  . Muscle cramps at night 12/16/2015  . Dysarthria 12/16/2015  . Expressive aphasia 12/16/2015  . Bereavement 11/18/2015  . Hoarseness 09/22/2015  . Special screening for malignant neoplasms, colon 09/22/2015  . Dyspnea on exertion 07/21/2015  . History of renal cell cancer 09/07/2014  . Urinary incontinence 09/07/2014  . Contact dermatitis 04/08/2014  . Stopped smoking with greater than 25 pack year history 02/25/2014  . Irregular heart beat 01/30/2014  . Allergic rhinitis 01/30/2014  . Medicare annual wellness visit, subsequent 08/08/2012  . Screening for breast cancer 08/08/2012  . Atrophic vaginitis 08/08/2012  . GERD (gastroesophageal reflux disease) 05/07/2012  . History of cardiovascular disorder 04/30/2009  . COLITIS, ULCERATIVE 05/16/2007  . Personal history of malignant neoplasm of breast 05/16/2007    Trotter,Margaret PT,  DPT 09/21/2016, 11:04 AM  [place] MAIN [facility] SERVICES [address] [place], [place], [address] Phone: [phone]   Fax:  [phone]  Name: Mary Fields MRN: [id number] Date of Birth: September 09, 1941

## 2016-09-26 ENCOUNTER — Ambulatory Visit: Payer: PPO | Attending: Neurology

## 2016-09-26 ENCOUNTER — Ambulatory Visit: Payer: PPO | Admitting: Speech Pathology

## 2016-09-26 DIAGNOSIS — M6281 Muscle weakness (generalized): Secondary | ICD-10-CM

## 2016-09-26 DIAGNOSIS — R262 Difficulty in walking, not elsewhere classified: Secondary | ICD-10-CM | POA: Insufficient documentation

## 2016-09-26 DIAGNOSIS — R471 Dysarthria and anarthria: Secondary | ICD-10-CM | POA: Diagnosis not present

## 2016-09-26 DIAGNOSIS — R278 Other lack of coordination: Secondary | ICD-10-CM | POA: Insufficient documentation

## 2016-09-26 NOTE — Therapy (Signed)
[place] MAIN [facility] SERVICES [address] [place], [place], [address] Phone: [phone]   Fax:  [phone]  Physical Therapy Treatment  Patient Details  Name: Mary Fields MRN: [id number] Date of Birth: 01-10-1941 Referring Provider: Alda Berthold   Encounter Date: 09/26/2016      PT End of Session - 09/26/16 1506    Visit Number 16   Number of Visits 26   Date for PT Re-Evaluation 10/17/16   Authorization Type Gcodes 6/10   PT Start Time 1400   PT Stop Time 1430   PT Time Calculation (min) 30 min   Equipment Utilized During Treatment Gait belt   Activity Tolerance Patient limited by fatigue   Behavior During Therapy [facility] for tasks assessed/performed      Past Medical History:  Diagnosis Date  . 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 Atypical nevi   . Benign neoplasm of skin, site unspecified    Dr. Aubery Lapping  . Colitis   . 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 Disorder of bone and cartilage, unspecified   . Diverticulosis   . Dizziness and giddiness   . Dyspnea on exertion   . Erosive esophagitis   . Esophageal reflux   . Herpes simplex without mention of complication   . Hip fracture ([place]) 11/12   surgery  . Hx-TIA (transient ischemic attack)   . Malignant neoplasm of kidney, except pelvis   . 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 Other malaise and fatigue   . Palpitations   . Palpitations    a. 07/2015 Echo: EF 60-65%, Gr 1 DD, mild AI.  Marland Kitchen Personal history of malignant neoplasm of breast   . Personal history of other malignant neoplasm of skin   . Tobacco abuse   . Ulcerative colitis, unspecified   . Unspecified asthma(493.90)    since childhood  . Vaginal atrophy   . Vaginal stenosis     Past Surgical History:  Procedure Laterality Date  . ABDOMINAL HYSTERECTOMY  1986   endometriosis, Dr. Pearletha Furl  . APPENDECTOMY    . BREAST LUMPECTOMY Right 1995   right breast  cancer, lumpectomy and XRT and chemo  . COLONOSCOPY  4/10   diverticulosis; recheck 5 years  . ESOPHAGOGASTRODUODENOSCOPY  2002   esophagitis; grade 1, erosive  . HIP FRACTURE SURGERY Right 2005   hemiarthroplasty right, Dr. Francia Greaves  . HIP FRACTURE SURGERY Left 11/12   after fall at church  . MELANOMA EXCISION  1980   Dr. Sharlet Salina  . NEPHRECTOMY  2000   left renal cell carcinoma, Dr. Jacqlyn Larsen    There were no vitals filed for this visit.      Subjective Assessment - 09/26/16 1407    Subjective Patient reports increased soreness in her right LE along her quad/hip and she was not able to go up the stairs secondary to the soresness.    Currently in Pain? No/denies      Treatment:  Warm up on Nustep BUE/BLE level 3 x min (unbilled);  In the  bars: Rocker board with B UE support - x20 Tandem stance with blue half foam - 30sec x 3 Ball toss into basket while standing on blue airex pad with narrow BOS- x20    Gait in hallway: Forward walking with ball toss x150 feet with min VCs to increase step length and improve speed for better balance control; Forward walking with lateral head turns with  cognitive challenge of calling out cards on wall for dual tasks; Forward walking with ball dribbling - x 150 ft Forward walking with ball toss with PT - x144f       PT Education - 09/26/16 1439    Education provided Yes   Education Details Educated on pathophysiology of ALS and muscle soreness   Person(s) Educated Patient   Methods Explanation;Demonstration   Comprehension Verbalized understanding;Returned demonstration             PT Long Term Goals - 09/19/16 1041      PT LONG TERM GOAL #1   Title Patient will be independent in home exercise program to improve strength/mobility for better functional independence with ADLs.   Time 4   Period Weeks   Status On-going     PT LONG TERM GOAL #2   Title Patient (> 630years old) will complete five times sit to stand test in < 15  seconds indicating an increased LE strength and improved balance.   Time 4   Period Weeks   Status Achieved     PT LONG TERM GOAL #3   Title Patient will tolerate 5 seconds of single leg stance without loss of balance to improve ability to get in and out of shower safely.   Time 4   Period Weeks   Status Partially Met     PT LONG TERM GOAL #4   Title Patient will increase BLE and BUE gross strength to 4+/5 as to improve functional strength for independent gait, increased standing tolerance and increased ADL ability.   Time 4   Period Weeks   Status Partially Met               Plan - 09/26/16 1507    Clinical Impression Statement Performed greater balance exercises during todays session secondary to increased pain in her R hip/quad. Patient demonstrates increased postural sway with half foam balancing indicating decreased static/dynamic balance and patient will benefit from further skilled therapy focused on improving muscular endurance/strength and balance to return to prior level of function.    Rehab Potential Good   PT Frequency 2x / week   PT Duration 4 weeks   PT Treatment/Interventions Therapeutic exercise;Balance training;Therapeutic activities   PT Next Visit Plan strengthening and balance training; progress HEP with wall squats and lateral walking with resistance   PT Home Exercise Plan HEP maintained   Consulted and Agree with Plan of Care Patient      Patient will benefit from skilled therapeutic intervention in order to improve the following deficits and impairments:  Abnormal gait, Decreased balance, Decreased endurance, Decreased strength, Dizziness  Visit Diagnosis: Muscle weakness (generalized)  Difficulty in walking, not elsewhere classified     Problem List Patient Active Problem List   Diagnosis Date Noted  . Suprapubic pain 07/25/2016  . 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 ALS ([place]otrophic lateral sclerosis) ([place] 05/15/2016  . History of  melanoma 01/20/2016  . Coagulation test abnormality 01/20/2016  . Hematoma 01/14/2016  . Right hip pain 12/28/2015  . Fall 12/28/2015  . Routine general medical examination at a health care facility 12/16/2015  . Memory loss 12/16/2015  . Muscle cramps at night 12/16/2015  . Dysarthria 12/16/2015  . Expressive aphasia 12/16/2015  . Bereavement 11/18/2015  . Hoarseness 09/22/2015  . Special screening for malignant neoplasms, colon 09/22/2015  . Dyspnea on exertion 07/21/2015  . History of renal cell cancer 09/07/2014  . Urinary incontinence 09/07/2014  . Contact dermatitis  04/08/2014  . Stopped smoking with greater than 25 pack year history 02/25/2014  . Irregular heart beat 01/30/2014  . Allergic rhinitis 01/30/2014  . Medicare annual wellness visit, subsequent 08/08/2012  . Screening for breast cancer 08/08/2012  . Atrophic vaginitis 08/08/2012  . GERD (gastroesophageal reflux disease) 05/07/2012  . History of cardiovascular disorder 04/30/2009  . COLITIS, ULCERATIVE 05/16/2007  . Personal history of malignant neoplasm of breast 05/16/2007    Blythe Stanford, PT DPT 09/26/2016, 3:25 PM  [place] MAIN [facility] SERVICES [address] [place], [place], [address] Phone: [phone]   Fax:  [phone]  Name: KEARI MIU MRN: [id number] Date of Birth: 08-07-1941

## 2016-09-27 ENCOUNTER — Encounter: Payer: Self-pay | Admitting: Speech Pathology

## 2016-09-27 NOTE — Therapy (Signed)
[place] MAIN [facility] SERVICES [address] [place], [place], [address] Phone: [phone]   Fax:  [phone]  Speech Language Pathology Evaluation  Patient Details  Name: Mary Fields MRN: [id number] Date of Birth: 1941/05/01 Referring Provider: Alda Berthold  Encounter Date: 09/26/2016      End of Session - 09/27/16 1536    Visit Number 1   Number of Visits 2   Date for SLP Re-Evaluation 10/05/16   SLP Start Time 69   SLP Stop Time  1400   SLP Time Calculation (min) 60 min   Activity Tolerance Patient tolerated treatment well      Past Medical History:  Diagnosis Date  . 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 Atypical nevi   . Benign neoplasm of skin, site unspecified    Dr. Aubery Lapping  . Colitis   . 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 Disorder of bone and cartilage, unspecified   . Diverticulosis   . Dizziness and giddiness   . Dyspnea on exertion   . Erosive esophagitis   . Esophageal reflux   . Herpes simplex without mention of complication   . Hip fracture ([place]) 11/12   surgery  . Hx-TIA (transient ischemic attack)   . Malignant neoplasm of kidney, except pelvis   . 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 Other malaise and fatigue   . Palpitations   . Palpitations    a. 07/2015 Echo: EF 60-65%, Gr 1 DD, mild AI.  Marland Kitchen Personal history of malignant neoplasm of breast   . Personal history of other malignant neoplasm of skin   . Tobacco abuse   . Ulcerative colitis, unspecified   . Unspecified asthma(493.90)    since childhood  . Vaginal atrophy   . Vaginal stenosis     Past Surgical History:  Procedure Laterality Date  . ABDOMINAL HYSTERECTOMY  1986   endometriosis, Dr. Pearletha Furl  . APPENDECTOMY    . BREAST LUMPECTOMY Right 1995   right breast cancer, lumpectomy and XRT and chemo  . COLONOSCOPY  4/10   diverticulosis; recheck 5 years  . ESOPHAGOGASTRODUODENOSCOPY  2002    esophagitis; grade 1, erosive  . HIP FRACTURE SURGERY Right 2005   hemiarthroplasty right, Dr. Francia Greaves  . HIP FRACTURE SURGERY Left 11/12   after fall at church  . MELANOMA EXCISION  1980   Dr. Sharlet Salina  . NEPHRECTOMY  2000   left renal cell carcinoma, Dr. Jacqlyn Larsen    There were no vitals filed for this visit.          SLP Evaluation [facility] - 09/27/16 0001      SLP Visit Information   SLP Received On 09/26/16   Referring Provider Narda Amber K   Onset Date 09/20/2016   Medical Diagnosis ALS     Subjective   Subjective Patient reports avoiding social communication   Patient/Family Stated Goal Speak better     Pain Assessment   Currently in Pain? No/denies     General Information   HPI The patient is a 75 year old woman with new diagnosis of amyotrophic lateral sclerosis.  She was seen in the [place] clinic for full assessment by the ALS team, including Speech-Language Pathology.  Her current status has been assessed and education provided by that SLP.     Prior Functional Status   Cognitive/Linguistic Baseline Within functional limits     Cognition   Overall Cognitive  Status Within Functional Limits for tasks assessed     Oral Motor/Sensory Function   Overall Oral Motor/Sensory Function Impaired   Overall Oral Motor/Sensory Function lingual weakness and atrophy, palatal weakness     Motor Speech   Overall Motor Speech Impaired   Respiration Impaired   Level of Impairment Conversation   Phonation Low vocal intensity;Other (comment)  Strained   Resonance Hypernasality   Articulation Impaired  imprecise articulation, distorted vowels   Intelligibility Intelligible                         SLP Education - 09/27/16 1535    Education provided Yes   Education Details Speech and swallowing needs best met by SLP familiar with ALS   Person(s) Educated Patient   Methods Explanation   Comprehension Verbalized understanding            SLP Long  Term Goals - 09/27/16 1540      SLP LONG TERM GOAL #1   Title The patient will demonstrate knowledge and use of strategies to improve intelligibility and energy conservation.   Time 1   Period Weeks   Status New          Plan - 09/27/16 1539    Clinical Impression Statement This 75 year old woman with new diagnosis of ALS is presenting with moderate dysarthria of speech and moderate oropharyngeal dysphagia.  The patient has been evaluated at the [place] clinical and per documentation will be followed by speech therapy through that clinic.  During that evaluation, the patient received education regarding dysphagia (current strategies and potential changes).  She received education regarding speech, including strategies to improve intelligibility, energy conservation, and potential changes in speech.  The patient states her next appointment at the [place] clinic is 10/10/2016.  It is recommended that the patient's communication and swallowing needs be managed by a clinician familiar with ALS.  I will follow the patient for one more session to insure that she understands and implements strategies to improve intelligibility and energy conservation.   Speech Therapy Frequency 1x /week   Duration 1 week   Treatment/Interventions Patient/family education   Potential to Achieve Goals Good   Potential Considerations Ability to learn/carryover information;Cooperation/participation level;Previous level of function;Family/community support   SLP Home Exercise Plan Overarticulation, short phrases, energy conservation   Consulted and Agree with Plan of Care Patient      Patient will benefit from skilled therapeutic intervention in order to improve the following deficits and impairments:   Dysarthria and anarthria - Plan: SLP plan of care cert/re-cert      G-Codes - 17/00/17 1542    Functional Assessment Tool Used Motor Speech Evaluation, clinical judgment   Functional Limitations Motor speech    Motor Speech Current Status [phone]) At least 40 percent but less than 60 percent impaired, limited or restricted   Motor Speech Goal Status (Q7591) At least 40 percent but less than 60 percent impaired, limited or restricted      Problem List Patient Active Problem List   Diagnosis Date Noted  . Suprapubic pain 07/25/2016  . 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 History of melanoma 01/20/2016  . Coagulation test abnormality 01/20/2016  . Hematoma 01/14/2016  . Right hip pain 12/28/2015  . Fall 12/28/2015  . Routine general medical examination at a health care facility 12/16/2015  . Memory loss 12/16/2015  . Muscle cramps at night  12/16/2015  . Dysarthria 12/16/2015  . Expressive aphasia 12/16/2015  . Bereavement 11/18/2015  . Hoarseness 09/22/2015  . Special screening for malignant neoplasms, colon 09/22/2015  . Dyspnea on exertion 07/21/2015  . History of renal cell cancer 09/07/2014  . Urinary incontinence 09/07/2014  . Contact dermatitis 04/08/2014  . Stopped smoking with greater than 25 pack year history 02/25/2014  . Irregular heart beat 01/30/2014  . Allergic rhinitis 01/30/2014  . Medicare annual wellness visit, subsequent 08/08/2012  . Screening for breast cancer 08/08/2012  . Atrophic vaginitis 08/08/2012  . GERD (gastroesophageal reflux disease) 05/07/2012  . History of cardiovascular disorder 04/30/2009  . COLITIS, ULCERATIVE 05/16/2007  . Personal history of malignant neoplasm of breast 05/16/2007   Leroy Sea, MS/CCC- SLP  Lou Miner 09/27/2016, 3:44 PM  [place] MAIN [facility] SERVICES [address] [place], [place], [address] Phone: [phone]   Fax:  [phone]  Name: JAYMEE TILSON MRN: [id number] Date of Birth: [birth year]

## 2016-09-28 ENCOUNTER — Ambulatory Visit: Payer: PPO

## 2016-09-28 DIAGNOSIS — R262 Difficulty in walking, not elsewhere classified: Secondary | ICD-10-CM

## 2016-09-28 DIAGNOSIS — M6281 Muscle weakness (generalized): Secondary | ICD-10-CM

## 2016-09-28 NOTE — Therapy (Signed)
[place] MAIN [facility] SERVICES [address] [place], [place], [address] Phone: [phone]   Fax:  [phone]  Physical Therapy Treatment  Patient Details  Name: Mary Fields MRN: [id number] Date of Birth: 1941/07/30 Referring Provider: Alda Berthold   Encounter Date: 09/28/2016      PT End of Session - 09/28/16 1557    Visit Number 17   Number of Visits 26   Date for PT Re-Evaluation 10/17/16   Authorization Type Gcodes 7/10   PT Start Time 1435   PT Stop Time 1515   PT Time Calculation (min) 40 min   Equipment Utilized During Treatment Gait belt   Activity Tolerance Patient limited by fatigue   Behavior During Therapy [facility] for tasks assessed/performed      Past Medical History:  Diagnosis Date  . 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 Atypical nevi   . Benign neoplasm of skin, site unspecified    Dr. Aubery Lapping  . Colitis   . 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 Disorder of bone and cartilage, unspecified   . Diverticulosis   . Dizziness and giddiness   . Dyspnea on exertion   . Erosive esophagitis   . Esophageal reflux   . Herpes simplex without mention of complication   . Hip fracture ([place]) 11/12   surgery  . Hx-TIA (transient ischemic attack)   . Malignant neoplasm of kidney, except pelvis   . 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 Other malaise and fatigue   . Palpitations   . Palpitations    a. 07/2015 Echo: EF 60-65%, Gr 1 DD, mild AI.  Marland Kitchen Personal history of malignant neoplasm of breast   . Personal history of other malignant neoplasm of skin   . Tobacco abuse   . Ulcerative colitis, unspecified   . Unspecified asthma(493.90)    since childhood  . Vaginal atrophy   . Vaginal stenosis     Past Surgical History:  Procedure Laterality Date  . ABDOMINAL HYSTERECTOMY  1986   endometriosis, Dr. Pearletha Furl  . APPENDECTOMY    . BREAST LUMPECTOMY Right 1995   right breast  cancer, lumpectomy and XRT and chemo  . COLONOSCOPY  4/10   diverticulosis; recheck 5 years  . ESOPHAGOGASTRODUODENOSCOPY  2002   esophagitis; grade 1, erosive  . HIP FRACTURE SURGERY Right 2005   hemiarthroplasty right, Dr. Francia Greaves  . HIP FRACTURE SURGERY Left 11/12   after fall at church  . MELANOMA EXCISION  1980   Dr. Sharlet Salina  . NEPHRECTOMY  2000   left renal cell carcinoma, Dr. Jacqlyn Larsen    There were no vitals filed for this visit.      Subjective Assessment - 09/28/16 1540    Subjective Patient reports her R hip still hurts but is starting to feel better. Patient states she was able to perform the stairs.   Currently in Pain? No/denies      Treatment:  At treatment table: Manually assisted hip flexion/ext on R - x 3 min Manually assisted hip external/internal rotation on R - x 19mn   In the  bars: Ball toss into basket while standing on blue airex pad with narrow BOS; performed with semi tandem BOS- 2x20  Reaching for balls outside of BOS and placing in basket with narrow BOS - 2 x 20    Gait in hallway: Forward walking with ball toss x150 feet with min VCs to increase step  length and improve speed for better balance control; Forward walking with lateral head turns with cognitive challenge of calling out cards on wall for dual tasks; Forward walking and backward walking with ball dribbling - x 150 ft Forward walking with ball toss with PT - x16f        PT Education - 09/28/16 1557    Education provided Yes   Education Details form/technique with exercises   Person(s) Educated Patient   Methods Explanation;Demonstration   Comprehension Returned demonstration;Verbalized understanding             PT Long Term Goals - 09/19/16 1041      PT LONG TERM GOAL #1   Title Patient will be independent in home exercise program to improve strength/mobility for better functional independence with ADLs.   Time 4   Period Weeks   Status On-going     PT LONG TERM  GOAL #2   Title Patient (> 474years old) will complete five times sit to stand test in < 15 seconds indicating an increased LE strength and improved balance.   Time 4   Period Weeks   Status Achieved     PT LONG TERM GOAL #3   Title Patient will tolerate 5 seconds of single leg stance without loss of balance to improve ability to get in and out of shower safely.   Time 4   Period Weeks   Status Partially Met     PT LONG TERM GOAL #4   Title Patient will increase BLE and BUE gross strength to 4+/5 as to improve functional strength for independent gait, increased standing tolerance and increased ADL ability.   Time 4   Period Weeks   Status Partially Met               Plan - 09/28/16 1601    Clinical Impression Statement Continued to perform balancing exercises as patient continues to have increased postural sway with balancing activities. Patient demonstrates decreased hip pain after performing hip mobility exercises and will benefit from further skilled therapy to improve balance and strength.    Rehab Potential Good   PT Frequency 2x / week   PT Duration 4 weeks   PT Treatment/Interventions Therapeutic exercise;Balance training;Therapeutic activities   PT Next Visit Plan strengthening and balance training; progress HEP with wall squats and lateral walking with resistance   PT Home Exercise Plan HEP maintained   Consulted and Agree with Plan of Care Patient      Patient will benefit from skilled therapeutic intervention in order to improve the following deficits and impairments:  Abnormal gait, Decreased balance, Decreased endurance, Decreased strength, Dizziness  Visit Diagnosis: Muscle weakness (generalized)  Difficulty in walking, not elsewhere classified     Problem List Patient Active Problem List   Diagnosis Date Noted  . Suprapubic pain 07/25/2016  . 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 [place] lateral [place]) [place] 05/15/2016  . History of  melanoma 01/20/2016  . Coagulation test abnormality 01/20/2016  . Hematoma 01/14/2016  . Right hip pain 12/28/2015  . Fall 12/28/2015  . Routine general medical examination at a health care facility 12/16/2015  . Memory loss 12/16/2015  . Muscle cramps at night 12/16/2015  . Dysarthria 12/16/2015  . Expressive aphasia 12/16/2015  . Bereavement 11/18/2015  . Hoarseness 09/22/2015  . Special screening for malignant neoplasms, colon 09/22/2015  . Dyspnea on exertion 07/21/2015  . History of renal cell cancer 09/07/2014  . Urinary incontinence 09/07/2014  .  Contact dermatitis 04/08/2014  . Stopped smoking with greater than 25 pack year history 02/25/2014  . Irregular heart beat 01/30/2014  . Allergic rhinitis 01/30/2014  . Medicare annual wellness visit, subsequent 08/08/2012  . Screening for breast cancer 08/08/2012  . Atrophic vaginitis 08/08/2012  . GERD (gastroesophageal reflux disease) 05/07/2012  . History of cardiovascular disorder 04/30/2009  . COLITIS, ULCERATIVE 05/16/2007  . Personal history of malignant neoplasm of breast 05/16/2007    Blythe Stanford, PT DPT 09/28/2016, 4:07 PM  [place] MAIN [facility] SERVICES [address] [place], [place], [address] Phone: [phone]   Fax:  [phone]  Name: MITZY NARON MRN: [id number] Date of Birth: 11-18-1940

## 2016-10-02 ENCOUNTER — Other Ambulatory Visit: Payer: Self-pay | Admitting: Neurology

## 2016-10-02 DIAGNOSIS — G1221 Amyotrophic lateral sclerosis: Secondary | ICD-10-CM | POA: Diagnosis not present

## 2016-10-02 MED ORDER — TIZANIDINE HCL 2 MG PO CAPS: 2.0000 mg | ORAL_CAPSULE | Freq: Every day | ORAL | 2 refills | Status: DC

## 2016-10-03 ENCOUNTER — Ambulatory Visit: Payer: PPO

## 2016-10-03 ENCOUNTER — Ambulatory Visit: Payer: PPO | Admitting: Occupational Therapy

## 2016-10-03 DIAGNOSIS — R262 Difficulty in walking, not elsewhere classified: Secondary | ICD-10-CM

## 2016-10-03 DIAGNOSIS — M6281 Muscle weakness (generalized): Secondary | ICD-10-CM

## 2016-10-03 DIAGNOSIS — R278 Other lack of coordination: Secondary | ICD-10-CM

## 2016-10-03 NOTE — Therapy (Signed)
[place] MAIN [facility] SERVICES [address] [place], [place], [address] Phone: [phone]   Fax:  [phone]  Physical Therapy Treatment  Patient Details  Name: Mary Fields MRN: [id number] Date of Birth: 1940/12/02 Referring Provider: Alda Berthold   Encounter Date: 10/03/2016      PT End of Session - 10/03/16 1735    Visit Number 18   Number of Visits 26   Date for PT Re-Evaluation 10/17/16   Authorization Type Gcodes 8/10   PT Start Time 1515   PT Stop Time 1600   PT Time Calculation (min) 45 min   Equipment Utilized During Treatment Gait belt   Activity Tolerance Patient limited by fatigue   Behavior During Therapy [facility] for tasks assessed/performed      Past Medical History:  Diagnosis Date  . 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 Atypical nevi   . Benign neoplasm of skin, site unspecified    Dr. Aubery Lapping  . Colitis   . 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 Disorder of bone and cartilage, unspecified   . Diverticulosis   . Dizziness and giddiness   . Dyspnea on exertion   . Erosive esophagitis   . Esophageal reflux   . Herpes simplex without mention of complication   . Hip fracture ([place]) 11/12   surgery  . Hx-TIA (transient ischemic attack)   . Malignant neoplasm of kidney, except pelvis   . 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 Other malaise and fatigue   . Palpitations   . Palpitations    a. 07/2015 Echo: EF 60-65%, Gr 1 DD, mild AI.  Marland Kitchen Personal history of malignant neoplasm of breast   . Personal history of other malignant neoplasm of skin   . Tobacco abuse   . Ulcerative colitis, unspecified   . Unspecified asthma(493.90)    since childhood  . Vaginal atrophy   . Vaginal stenosis     Past Surgical History:  Procedure Laterality Date  . ABDOMINAL HYSTERECTOMY  1986   endometriosis, Dr. Pearletha Furl  . APPENDECTOMY    . BREAST LUMPECTOMY Right 1995   right breast  cancer, lumpectomy and XRT and chemo  . COLONOSCOPY  4/10   diverticulosis; recheck 5 years  . ESOPHAGOGASTRODUODENOSCOPY  2002   esophagitis; grade 1, erosive  . HIP FRACTURE SURGERY Right 2005   hemiarthroplasty right, Dr. Francia Greaves  . HIP FRACTURE SURGERY Left 11/12   after fall at church  . MELANOMA EXCISION  1980   Dr. Sharlet Salina  . NEPHRECTOMY  2000   left renal cell carcinoma, Dr. Jacqlyn Larsen    There were no vitals filed for this visit.      Subjective Assessment - 10/03/16 1521    Subjective Patient reports her R hip no longer hurts and states she's feeling much better. Patient reports no current complaints.    Currently in Pain? No/denies      Treatment: In the  bars: Ball toss into basket while standing on blue airex pad with narrow BOS; performed with semi tandem BOS- 2x20  Balance stones balance with dynadisc - x 6 down and back  Reaching for balls outside of BOS and placing in basket with narrow BOS - 2 x 20  Gait in hallway: Forward walking with ball toss x150 feet with min VCs to increase step length and improve speed for better balance control; Side stepping while ball toss with PT -- 20f B  Forward walking with ball toss with PT - x146f Stepping lunges onto dynadisc - x 15 B Tandem amb forward backward - 2 x 364f      PT Education - 10/03/16 1735    Education provided Yes   Education Details Educated on form/tehcnique throughout treatment session   Person(s) Educated Patient   Methods Explanation;Demonstration   Comprehension Verbalized understanding;Returned demonstration             PT Long Term Goals - 09/19/16 1041      PT LONG TERM GOAL #1   Title Patient will be independent in home exercise program to improve strength/mobility for better functional independence with ADLs.   Time 4   Period Weeks   Status On-going     PT LONG TERM GOAL #2   Title Patient (> [age]ears old) will complete five times sit to stand test in < 15 seconds indicating  an increased LE strength and improved balance.   Time 4   Period Weeks   Status Achieved     PT LONG TERM GOAL #3   Title Patient will tolerate 5 seconds of single leg stance without loss of balance to improve ability to get in and out of shower safely.   Time 4   Period Weeks   Status Partially Met     PT LONG TERM GOAL #4   Title Patient will increase BLE and BUE gross strength to 4+/5 as to improve functional strength for independent gait, increased standing tolerance and increased ADL ability.   Time 4   Period Weeks   Status Partially Met               Plan - 10/03/16 1736    Clinical Impression Statement Conitnued focusing on static and dynamic balancing activities throughout treatment session. Patient demonstrates increased postural sway with dynamic balancing indicating decreased muscular coordination and patient will benefit from futher skilled therapy to return to prior level of function.    Rehab Potential Good   PT Frequency 2x / week   PT Duration 4 weeks   PT Treatment/Interventions Therapeutic exercise;Balance training;Therapeutic activities   PT Next Visit Plan strengthening and balance training; progress HEP with wall squats and lateral walking with resistance   PT Home Exercise Plan HEP maintained   Consulted and Agree with Plan of Care Patient      Patient will benefit from skilled therapeutic intervention in order to improve the following deficits and impairments:  Abnormal gait, Decreased balance, Decreased endurance, Decreased strength, Dizziness  Visit Diagnosis: Muscle weakness (generalized)  Difficulty in walking, not elsewhere classified     Problem List Patient Active Problem List   Diagnosis Date Noted  . Suprapubic pain 07/25/2016  . 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 [place]) [place]  . History of melanoma 01/20/2016  . Coagulation test abnormality 01/20/2016  . Hematoma 01/14/2016  . Right  hip pain 12/28/2015  . Fall 12/28/2015  . Routine general medical examination at a health care facility 12/16/2015  . Memory loss 12/16/2015  . Muscle cramps at night 12/16/2015  . Dysarthria 12/16/2015  . Expressive aphasia 12/16/2015  . Bereavement 11/18/2015  . Hoarseness 09/22/2015  . Special screening for malignant neoplasms, colon 09/22/2015  . Dyspnea on exertion 07/21/2015  . History of renal cell cancer 09/07/2014  . Urinary incontinence 09/07/2014  . Contact dermatitis 04/08/2014  . Stopped smoking with greater than 25 pack year history 02/25/2014  . Irregular heart beat  01/30/2014  . Allergic rhinitis 01/30/2014  . Medicare annual wellness visit, subsequent 08/08/2012  . Screening for breast cancer 08/08/2012  . Atrophic vaginitis 08/08/2012  . GERD (gastroesophageal reflux disease) 05/07/2012  . History of cardiovascular disorder 04/30/2009  . COLITIS, ULCERATIVE 05/16/2007  . Personal history of malignant neoplasm of breast 05/16/2007    Blythe Stanford, PT DPT 10/03/2016, 5:39 PM  [place] MAIN [facility] SERVICES [address] [place], [place], [address] Phone: [phone]   Fax:  [phone]  Name: Mary Fields MRN: [id number] Date of Birth: 01/21/1941

## 2016-10-03 NOTE — Therapy (Signed)
[place] MAIN [facility] SERVICES [address] [place], [place], [address] Phone: [phone]   Fax:  [phone]  Occupational Therapy Evaluation  Patient Details  Name: Mary Fields MRN: [id number] Date of Birth: 1941-06-14 Referring Provider: Dr. Posey Pronto  Encounter Date: 10/03/2016      OT End of Session - 10/03/16 1703    Visit Number 1   Number of Visits 16   Date for OT Re-Evaluation 11/28/16   Authorization Type G-code 1 of 10   OT Start Time 1600   OT Stop Time 1650   OT Time Calculation (min) 50 min   Activity Tolerance Patient tolerated treatment well   Behavior During Therapy [facility] for tasks assessed/performed      Past Medical History:  Diagnosis Date  . 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 Atypical nevi   . Benign neoplasm of skin, site unspecified    Dr. Aubery Lapping  . Colitis   . 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 Disorder of bone and cartilage, unspecified   . Diverticulosis   . Dizziness and giddiness   . Dyspnea on exertion   . Erosive esophagitis   . Esophageal reflux   . Herpes simplex without mention of complication   . Hip fracture ([place]) 11/12   surgery  . Hx-TIA (transient ischemic attack)   . Malignant neoplasm of kidney, except pelvis   . 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 Other malaise and fatigue   . Palpitations   . Palpitations    a. 07/2015 Echo: EF 60-65%, Gr 1 DD, mild AI.  Marland Kitchen Personal history of malignant neoplasm of breast   . Personal history of other malignant neoplasm of skin   . Tobacco abuse   . Ulcerative colitis, unspecified   . Unspecified asthma(493.90)    since childhood  . Vaginal atrophy   . Vaginal stenosis     Past Surgical History:  Procedure Laterality Date  . ABDOMINAL HYSTERECTOMY  1986   endometriosis, Dr. Pearletha Furl  . APPENDECTOMY    . BREAST LUMPECTOMY Right 1995   right breast cancer, lumpectomy and XRT and chemo  .  COLONOSCOPY  4/10   diverticulosis; recheck 5 years  . ESOPHAGOGASTRODUODENOSCOPY  2002   esophagitis; grade 1, erosive  . HIP FRACTURE SURGERY Right 2005   hemiarthroplasty right, Dr. Francia Greaves  . HIP FRACTURE SURGERY Left 11/12   after fall at church  . MELANOMA EXCISION  1980   Dr. Sharlet Salina  . NEPHRECTOMY  2000   left renal cell carcinoma, Dr. Jacqlyn Larsen    There were no vitals filed for this visit.      Subjective Assessment - 10/03/16 1654    Subjective  Pt. reports she is pocketing a lot of food in the side of her mouth when eating.   Pertinent History Pt. is a 75 y.o. female who was diadnosed with ALS on 07/09/2016. Pt. resides in an independent apartment at [place]. Pt. is receiving Physical therapy, speech therapy, and now presents for occupational therapy services.  Pt. volunteers two days a week at [facility].   Currently in Pain? No/denies           [facility] OT Assessment - 10/03/16 1607      Assessment   Diagnosis ALS   Referring Provider Dr. Posey Pronto   Onset Date 07/09/16     Balance Screen   Has the patient fallen in the past 6  months No   Is the patient reluctant to leave their home because of a fear of falling?  No     Home  Environment   Family/patient expects to be discharged to: Private residence   [place] Level entry   Home Layout One level   Bathroom Shower/Tub Why Accessibility Yes   Adaptive equipment Reacher;Long-handled shoe horn;Long-handled sponge     Prior Function   Level of Independence Independent   Vocation Retired   Leisure music, books, movies, train     ADL   Grooming Independent   Biochemist, clinical Independent   Lower Body Dressing Minimal assistance  ordered elastic pants   Toileting - Clothing Manipulation Increased time  better  since purchasing elastic pants   [place] care of all shopping needs independently   Light Housekeeping --  able to perform laundry. Has a housekeeper to assist.   Meal Prep Able to complete simple warm meal prep   Medication Management Is responsible for taking medication in correct dosages at correct time   Financial Management Manages financial matters independently (budgets, writes checks, pays rent, bills goes to bank), collects and keeps track of income     Vision - History   Baseline Vision Wears glasses all the time   Visual History Cataracts     Activity Tolerance   Activity Tolerance Tolerate 30+ min activity without fatigue     Sensation   Light Touch Appears Intact     Coordination   Right 9 Hole Peg Test 21 sec.   Left 9 Hole Peg Test 22 sec.     Strength   Overall Strength Comments BUE strength: 4+/5 overall with bilateral shoulder flexion, abuction, elbow flexion, extension, wrist extension.     Hand Function   Right Hand Grip (lbs) 28   Right Hand Lateral Pinch 6 lbs   Right Hand 3 Point Pinch 7 lbs   Left Hand Grip (lbs) 25   Left Hand Lateral Pinch 7 lbs   Left 3 point pinch 8 lbs                         OT Education - 10/03/16 1702    Education provided Yes   Education Details [place], pinch strength   Person(s) Educated Patient   Methods Explanation;Demonstration   Comprehension Verbalized understanding;Returned demonstration             OT Long Term Goals - 10/03/16 1710      OT LONG TERM GOAL #1   Title Pt. will improve pinch strength to be able to independently hang/clip pants on a hanger,   Baseline Pt. has difficulty   Time 8   Period Weeks   Status New     OT LONG TERM GOAL #2   Title Pt. will improve grip strength to be able to open water bottles.   Baseline Pt. has difficulty   Time 8   Period Weeks   Status New     OT LONG TERM GOAL #3   Title Pt. will  improve coordination to be able to clip nails independently   Baseline Pt. has difficulty   Time 8   Period Weeks  Status New     OT LONG TERM GOAL #4   Title Pt. will demontrate work simplification techniques for home management/IADLs   Baseline Pt. has difficulty   Time 8   Period Weeks   Status New               Plan - 10/03/16 1704    Clinical Impression Statement Pt. is a 75 y.o. female who was diagnosed with ALS on 07/09/2016. Pt. is independent with most ADL tasks. Pt. resides in independent living at [place]. Pt. presents with impaired pinch strength, grip strength, and coordination skills needed to hang pants on a hanger, open water bottles, office supplies, and clipping nails. Pt. could benefit from skilled OT services to work on improving UE strength, and coordination skills for ADL and IADL functioning.    Rehab Potential Good   OT Frequency 2x / week   OT Duration 8 weeks   OT Treatment/Interventions Self-care/ADL training;Moist Heat;Therapeutic exercise;Neuromuscular education;Energy conservation;Manual Therapy;Therapeutic exercises;Patient/family education;DME and/or AE instruction;Therapeutic activities      Patient will benefit from skilled therapeutic intervention in order to improve the following deficits and impairments:  Decreased coordination, Decreased strength, Impaired UE functional use, Impaired tone, Decreased activity tolerance  Visit Diagnosis: Muscle weakness (generalized) - Plan: Ot plan of care cert/re-cert  Other lack of coordination - Plan: Ot plan of care cert/re-cert      G-Codes - [id number]    Functional Assessment Tool Used clinical judgement, dynamometer, MMT, 9 hole peg test   Functional Limitation Self care   Self Care Current Status [id number]) At least 1 percent but less than 20 percent impaired, limited or restricted   Self Care Goal Status [id number]) 0 percent impaired, limited or restricted      Problem  List Patient Active Problem List   Diagnosis Date Noted  . Suprapubic pain 07/25/2016  . 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 History of melanoma 01/20/2016  . Coagulation test abnormality 01/20/2016  . Hematoma 01/14/2016  . Right hip pain 12/28/2015  . Fall 12/28/2015  . Routine general medical examination at a health care facility 12/16/2015  . Memory loss 12/16/2015  . Muscle cramps at night 12/16/2015  . Dysarthria 12/16/2015  . Expressive aphasia 12/16/2015  . Bereavement 11/18/2015  . Hoarseness 09/22/2015  . Special screening for malignant neoplasms, colon 09/22/2015  . Dyspnea on exertion 07/21/2015  . History of renal cell cancer 09/07/2014  . Urinary incontinence 09/07/2014  . Contact dermatitis 04/08/2014  . Stopped smoking with greater than 25 pack year history 02/25/2014  . Irregular heart beat 01/30/2014  . Allergic rhinitis 01/30/2014  . Medicare annual wellness visit, subsequent 08/08/2012  . Screening for breast cancer 08/08/2012  . Atrophic vaginitis 08/08/2012  . GERD (gastroesophageal reflux disease) 05/07/2012  . History of cardiovascular disorder 04/30/2009  . COLITIS, ULCERATIVE 05/16/2007  . Personal history of malignant neoplasm of breast 05/16/2007    Harrel Carina, MS, OTR/L 10/03/2016, 5:20 PM  [place] MAIN [facility] SERVICES [address] [place], [place], [address] Phone: [phone]   Fax:  [phone]  Name: Mary Fields MRN: [id number] Date of Birth: [birth year]

## 2016-10-05 ENCOUNTER — Ambulatory Visit: Payer: PPO | Admitting: Physical Therapy

## 2016-10-05 ENCOUNTER — Ambulatory Visit: Payer: PPO

## 2016-10-05 ENCOUNTER — Encounter: Payer: Self-pay | Admitting: Occupational Therapy

## 2016-10-05 ENCOUNTER — Ambulatory Visit: Payer: PPO | Admitting: Occupational Therapy

## 2016-10-05 ENCOUNTER — Encounter: Payer: PPO | Admitting: Speech Pathology

## 2016-10-05 DIAGNOSIS — R262 Difficulty in walking, not elsewhere classified: Secondary | ICD-10-CM

## 2016-10-05 DIAGNOSIS — M6281 Muscle weakness (generalized): Secondary | ICD-10-CM

## 2016-10-05 DIAGNOSIS — R278 Other lack of coordination: Secondary | ICD-10-CM

## 2016-10-05 NOTE — Patient Instructions (Signed)
OT TREATMENT    Therapeutic Exercise:  Pt. Worked on pinch strengthening in the left, and right hand for lateral, and 3pt. pinch using yellow, red, green resistive clips. Pt. worked on placing the clips at various vertical and horizontal angles. Tactile and verbal cues were required for eliciting the desired movement. Visual demonstration, visual, and verbal cues were provided. Pt. Worked on yellow theraputty ex. Pt. Worked on gross finger/digit extension, gross gripping, lateral pinch, 3pt. Pinch, lumbricals, thumb opposition to 2nd through 5th digits, and wrist extension.

## 2016-10-05 NOTE — Therapy (Signed)
[place] MAIN [facility] SERVICES [address] [place], [place], [address] Phone: [phone]   Fax:  [phone]  Physical Therapy Treatment  Patient Details  Name: Mary Fields MRN: [id number] Date of Birth: Sep 22, 1941 Referring Provider: Alda Berthold   Encounter Date: 10/05/2016      PT End of Session - 10/05/16 1736    Visit Number 19   Number of Visits 26   Date for PT Re-Evaluation 10/17/16   Authorization Type Gcodes 9/10   PT Start Time 1545   PT Stop Time 1630   PT Time Calculation (min) 45 min   Equipment Utilized During Treatment Gait belt   Activity Tolerance Patient limited by fatigue   Behavior During Therapy [facility] for tasks assessed/performed      Past Medical History:  Diagnosis Date  . 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 Atypical nevi   . Benign neoplasm of skin, site unspecified    Dr. Aubery Lapping  . Colitis   . 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 Disorder of bone and cartilage, unspecified   . Diverticulosis   . Dizziness and giddiness   . Dyspnea on exertion   . Erosive esophagitis   . Esophageal reflux   . Herpes simplex without mention of complication   . Hip fracture ([place]) 11/12   surgery  . Hx-TIA (transient ischemic attack)   . Malignant neoplasm of kidney, except pelvis   . 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 Other malaise and fatigue   . Palpitations   . Palpitations    a. 07/2015 Echo: EF 60-65%, Gr 1 DD, mild AI.  Marland Kitchen Personal history of malignant neoplasm of breast   . Personal history of other malignant neoplasm of skin   . Tobacco abuse   . Ulcerative colitis, unspecified   . Unspecified asthma(493.90)    since childhood  . Vaginal atrophy   . Vaginal stenosis     Past Surgical History:  Procedure Laterality Date  . ABDOMINAL HYSTERECTOMY  1986   endometriosis, Dr. Pearletha Furl  . APPENDECTOMY    . BREAST LUMPECTOMY Right 1995   right breast  cancer, lumpectomy and XRT and chemo  . COLONOSCOPY  4/10   diverticulosis; recheck 5 years  . ESOPHAGOGASTRODUODENOSCOPY  2002   esophagitis; grade 1, erosive  . HIP FRACTURE SURGERY Right 2005   hemiarthroplasty right, Dr. Francia Greaves  . HIP FRACTURE SURGERY Left 11/12   after fall at church  . MELANOMA EXCISION  1980   Dr. Sharlet Salina  . NEPHRECTOMY  2000   left renal cell carcinoma, Dr. Jacqlyn Larsen    There were no vitals filed for this visit.      Subjective Assessment - 10/05/16 1550    Subjective Patient reports no new symptoms since the previous visit.    Currently in Pain? No/denies       Treatment: In the  bars: Ball kicks with UE support and B LE - x 20  Ball toss with PT while in tandem stance on blue airex - x 20 B Tandem amb down 2 by 4 - x 5  Amb across two half foam rollers - x 5  Ball toss into basket - x20 in semi tandem on airex pad  Side stepping over 3 cones - x 20 B Ball toss with aim at playing card while on airex pad - 2 x 20 in semi tandem stance  Gait in hallway: Forward  walking / backwards walking with ball toss x75 feet with min VCs to increase step length and improve speed for better balance control; Side stepping while ball toss with PT -- 124f B      PT Education - 10/05/16 1734    Education provided Yes   Education Details Educated on joint positioning throughout treatment session   Person(s) Educated Patient   Methods Explanation;Demonstration   Comprehension Verbalized understanding;Returned demonstration             PT Long Term Goals - 09/19/16 1041      PT LONG TERM GOAL #1   Title Patient will be independent in home exercise program to improve strength/mobility for better functional independence with ADLs.   Time 4   Period Weeks   Status On-going     PT LONG TERM GOAL #2   Title Patient (> 651years old) will complete five times sit to stand test in < 15 seconds indicating an increased LE strength and improved balance.   Time 4    Period Weeks   Status Achieved     PT LONG TERM GOAL #3   Title Patient will tolerate 5 seconds of single leg stance without loss of balance to improve ability to get in and out of shower safely.   Time 4   Period Weeks   Status Partially Met     PT LONG TERM GOAL #4   Title Patient will increase BLE and BUE gross strength to 4+/5 as to improve functional strength for independent gait, increased standing tolerance and increased ADL ability.   Time 4   Period Weeks   Status Partially Met               Plan - 10/05/16 1736    Clinical Impression Statement Continued to focus on dynamic and static balance and patient requires UE support and demonstrates increased postural sway throughout entirity of session. Patient will benefit from further skilled therapy to improve balance and return to prior level of function.    Rehab Potential Good   PT Frequency 2x / week   PT Duration 4 weeks   PT Treatment/Interventions Therapeutic exercise;Balance training;Therapeutic activities   PT Next Visit Plan strengthening and balance training; progress HEP with wall squats and lateral walking with resistance   PT Home Exercise Plan HEP maintained   Consulted and Agree with Plan of Care Patient      Patient will benefit from skilled therapeutic intervention in order to improve the following deficits and impairments:  Abnormal gait, Decreased balance, Decreased endurance, Decreased strength, Dizziness  Visit Diagnosis: Muscle weakness (generalized)  Other lack of coordination  Difficulty in walking, not elsewhere classified     Problem List Patient Active Problem List   Diagnosis Date Noted  . Suprapubic pain 07/25/2016  . 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 ALS (amyotrophic lateral sclerosis) ([place] 05/15/2016  . History of melanoma 01/20/2016  . Coagulation test abnormality 01/20/2016  . Hematoma 01/14/2016  . Right hip pain 12/28/2015  . Fall 12/28/2015  . Routine  general medical examination at a health care facility 12/16/2015  . Memory loss 12/16/2015  . Muscle cramps at night 12/16/2015  . Dysarthria 12/16/2015  . Expressive aphasia 12/16/2015  . Bereavement 11/18/2015  . Hoarseness 09/22/2015  . Special screening for malignant neoplasms, colon 09/22/2015  . Dyspnea on exertion 07/21/2015  . History of renal cell cancer 09/07/2014  . Urinary incontinence 09/07/2014  . Contact dermatitis 04/08/2014  . Stopped  smoking with greater than 25 pack year history 02/25/2014  . Irregular heart beat 01/30/2014  . Allergic rhinitis 01/30/2014  . Medicare annual wellness visit, subsequent 08/08/2012  . Screening for breast cancer 08/08/2012  . Atrophic vaginitis 08/08/2012  . GERD (gastroesophageal reflux disease) 05/07/2012  . History of cardiovascular disorder 04/30/2009  . COLITIS, ULCERATIVE 05/16/2007  . Personal history of malignant neoplasm of breast 05/16/2007    Blythe Stanford, PT DPT 10/05/2016, 5:40 PM  [place] MAIN [facility] SERVICES [address] [place], [place], [address] Phone: [phone]   Fax:  [phone]  Name: KONA YUSUF MRN: [id number] Date of Birth: [birth year]

## 2016-10-05 NOTE — Therapy (Signed)
[place] MAIN [facility] SERVICES [address] [place], [place], [address] Phone: [phone]   Fax:  [phone]  Occupational Therapy Treatment  Patient Details  Name: Mary Fields MRN: [id number] Date of Birth: [birth year] Referring Provider: Dr. Posey Pronto  Encounter Date: 10/05/2016      OT End of Session - 10/05/16 1701    Visit Number 2   Number of Visits 16   Date for OT Re-Evaluation 11/28/16   Authorization Type G-code 2 of 10   OT Start Time 1508   OT Stop Time 1546   OT Time Calculation (min) 38 min   Activity Tolerance Patient tolerated treatment well   Behavior During Therapy [facility] for tasks assessed/performed      Past Medical History:  Diagnosis Date  . 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 Atypical nevi   . Benign neoplasm of skin, site unspecified    Dr. Aubery Lapping  . Colitis   . 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 Disorder of bone and cartilage, unspecified   . Diverticulosis   . Dizziness and giddiness   . Dyspnea on exertion   . Erosive esophagitis   . Esophageal reflux   . Herpes simplex without mention of complication   . Hip fracture ([place]) 11/12   surgery  . Hx-TIA (transient ischemic attack)   . Malignant neoplasm of kidney, except pelvis   . 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 Other malaise and fatigue   . Palpitations   . Palpitations    a. 07/2015 Echo: EF 60-65%, Gr 1 DD, mild AI.  Marland Kitchen Personal history of malignant neoplasm of breast   . Personal history of other malignant neoplasm of skin   . Tobacco abuse   . Ulcerative colitis, unspecified   . Unspecified asthma(493.90)    since childhood  . Vaginal atrophy   . Vaginal stenosis     Past Surgical History:  Procedure Laterality Date  . ABDOMINAL HYSTERECTOMY  1986   endometriosis, Dr. Pearletha Furl  . APPENDECTOMY    . BREAST LUMPECTOMY Right 1995   right breast cancer, lumpectomy and XRT and chemo  .  COLONOSCOPY  4/10   diverticulosis; recheck 5 years  . ESOPHAGOGASTRODUODENOSCOPY  2002   esophagitis; grade 1, erosive  . HIP FRACTURE SURGERY Right 2005   hemiarthroplasty right, Dr. Francia Greaves  . HIP FRACTURE SURGERY Left 11/12   after fall at church  . MELANOMA EXCISION  1980   Dr. Sharlet Salina  . NEPHRECTOMY  2000   left renal cell carcinoma, Dr. Jacqlyn Larsen    There were no vitals filed for this visit.      Subjective Assessment - 10/05/16 1659    Subjective  Pt. reports she is having company come from out of town.   Pertinent History Pt. is a 75 y.o. female who was diadnosed with ALS on 07/09/2016. Pt. resides in an independent apartment at [place]. Pt. is receiving Physical therapy, speech therapy, and now presents for occupational therapy services.  Pt. volunteers two days a week at [facility].   Currently in Pain? No/denies        OT TREATMENT    Therapeutic Exercise:  Pt. Worked on pinch strengthening in the left, and right hand for lateral, and 3pt. pinch using yellow, red, green resistive clips. Pt. worked on placing the clips at various vertical and horizontal angles. Tactile and verbal cues were required for eliciting the desired  movement. Visual demonstration, visual, and verbal cues were provided. Pt. Worked on yellow theraputty ex. Pt. Worked on gross finger/digit extension, gross gripping, lateral pinch, 3pt. Pinch, lumbricals, thumb opposition to 2nd through 5th digits, and wrist extension.                            OT Education - 10/05/16 1700    Education provided Yes   Education Details Yellow theraputty ex., HEP   Person(s) Educated Patient   Methods Explanation;Demonstration   Comprehension Verbalized understanding;Returned demonstration             OT Long Term Goals - 10/03/16 1710      OT LONG TERM GOAL #1   Title Pt. will improve pinch strength to be able to independently hang/clip pants on a hanger,   Baseline Pt. has  difficulty   Time 8   Period Weeks   Status New     OT LONG TERM GOAL #2   Title Pt. will improve grip strength to be able to open water bottles.   Baseline Pt. has difficulty   Time 8   Period Weeks   Status New     OT LONG TERM GOAL #3   Title Pt. will improve coordination to be able to clip nails independently   Baseline Pt. has difficulty   Time 8   Period Weeks   Status New     OT LONG TERM GOAL #4   Title Pt. will demontrate work simplification techniques for home management/IADLs   Baseline Pt. has difficulty   Time 8   Period Weeks   Status New               Plan - 10/05/16 1701    Clinical Impression Statement Pt. continues to present with weakness with grip, and pinch strength. Pt. reports clipping pants onto a hanger is dificult. Pt. requires visual demonstration, visual, and verbal cues for proper technique, and proper positioning of the hand during ADL tasks. Pt. continues to benefit from skilled OT services to improve pinch strength.   Rehab Potential Good   OT Frequency 2x / week   OT Duration 8 weeks   OT Treatment/Interventions Self-care/ADL training;Moist Heat;Therapeutic exercise;Neuromuscular education;Energy conservation;Manual Therapy;Therapeutic exercises;Patient/family education;DME and/or AE instruction;Therapeutic activities   Consulted and Agree with Plan of Care Patient      Patient will benefit from skilled therapeutic intervention in order to improve the following deficits and impairments:  Decreased coordination, Decreased strength, Impaired UE functional use, Impaired tone, Decreased activity tolerance  Visit Diagnosis: Muscle weakness (generalized)  Other lack of coordination    Problem List Patient Active Problem List   Diagnosis Date Noted  . Suprapubic pain 07/25/2016  . 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 History of melanoma 01/20/2016  . Coagulation test  abnormality 01/20/2016  . Hematoma 01/14/2016  . Right hip pain 12/28/2015  . Fall 12/28/2015  . Routine general medical examination at a health care facility 12/16/2015  . Memory loss 12/16/2015  . Muscle cramps at night 12/16/2015  . Dysarthria 12/16/2015  . Expressive aphasia 12/16/2015  . Bereavement 11/18/2015  . Hoarseness 09/22/2015  . Special screening for malignant neoplasms, colon 09/22/2015  . Dyspnea on exertion 07/21/2015  . History of renal cell cancer 09/07/2014  . Urinary incontinence 09/07/2014  . Contact dermatitis 04/08/2014  . Stopped smoking with greater than 25 pack year history 02/25/2014  .  Irregular heart beat 01/30/2014  . Allergic rhinitis 01/30/2014  . Medicare annual wellness visit, subsequent 08/08/2012  . Screening for breast cancer 08/08/2012  . Atrophic vaginitis 08/08/2012  . GERD (gastroesophageal reflux disease) 05/07/2012  . History of cardiovascular disorder 04/30/2009  . COLITIS, ULCERATIVE 05/16/2007  . Personal history of malignant neoplasm of breast 05/16/2007    Harrel Carina, MS, OTR/L 10/05/2016, 5:12 PM  [place] MAIN [facility] SERVICES [address] [place], [place], [address] Phone: [phone]   Fax:  [phone]  Name: Mary Fields MRN: [id number] Date of Birth: 07-Jul-1941

## 2016-10-09 ENCOUNTER — Ambulatory Visit: Payer: PPO | Admitting: Occupational Therapy

## 2016-10-09 ENCOUNTER — Ambulatory Visit: Payer: PPO | Admitting: Speech Pathology

## 2016-10-09 ENCOUNTER — Encounter: Payer: Self-pay | Admitting: Occupational Therapy

## 2016-10-09 DIAGNOSIS — M6281 Muscle weakness (generalized): Secondary | ICD-10-CM | POA: Diagnosis not present

## 2016-10-09 DIAGNOSIS — R471 Dysarthria and anarthria: Secondary | ICD-10-CM

## 2016-10-09 DIAGNOSIS — R278 Other lack of coordination: Secondary | ICD-10-CM

## 2016-10-09 NOTE — Therapy (Signed)
[place] MAIN [facility] SERVICES [address] [place], [place], [address] Phone: [phone]   Fax:  [phone]  Occupational Therapy Treatment  Patient Details  Name: Mary Fields MRN: [id number] Date of Birth: [birth year] Referring Provider: Dr. Posey Pronto  Encounter Date: 10/09/2016      OT End of Session - 10/09/16 1506    Visit Number 3   Number of Visits 16   Date for OT Re-Evaluation 11/28/16   Authorization Type G-code 3 of 10   OT Start Time 1416   OT Stop Time 1500   OT Time Calculation (min) 44 min   Activity Tolerance Patient tolerated treatment well   Behavior During Therapy [facility] for tasks assessed/performed      Past Medical History:  Diagnosis Date  . 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 Atypical nevi   . Benign neoplasm of skin, site unspecified    Dr. Aubery Lapping  . Colitis   . 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 Disorder of bone and cartilage, unspecified   . Diverticulosis   . Dizziness and giddiness   . Dyspnea on exertion   . Erosive esophagitis   . Esophageal reflux   . Herpes simplex without mention of complication   . Hip fracture ([place]) 11/12   surgery  . Hx-TIA (transient ischemic attack)   . Malignant neoplasm of kidney, except pelvis   . 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 Other malaise and fatigue   . Palpitations   . Palpitations    a. 07/2015 Echo: EF 60-65%, Gr 1 DD, mild AI.  Marland Kitchen Personal history of malignant neoplasm of breast   . Personal history of other malignant neoplasm of skin   . Tobacco abuse   . Ulcerative colitis, unspecified   . Unspecified asthma(493.90)    since childhood  . Vaginal atrophy   . Vaginal stenosis     Past Surgical History:  Procedure Laterality Date  . ABDOMINAL HYSTERECTOMY  1986   endometriosis, Dr. Pearletha Furl  . APPENDECTOMY    . BREAST LUMPECTOMY Right 1995   right breast cancer, lumpectomy and XRT and chemo  .  COLONOSCOPY  4/10   diverticulosis; recheck 5 years  . ESOPHAGOGASTRODUODENOSCOPY  2002   esophagitis; grade 1, erosive  . HIP FRACTURE SURGERY Right 2005   hemiarthroplasty right, Dr. Francia Greaves  . HIP FRACTURE SURGERY Left 11/12   after fall at church  . MELANOMA EXCISION  1980   Dr. Sharlet Salina  . NEPHRECTOMY  2000   left renal cell carcinoma, Dr. Jacqlyn Larsen    There were no vitals filed for this visit.      Subjective Assessment - 10/09/16 1503    Subjective  Pt. reports she was very busy when her sister came to town. Pt. Reports she has a meeting with the director of the [place] [facility] tomorrow.   Pertinent History Pt. is a 75 y.o. female who was diadnosed with ALS on 07/09/2016. Pt. resides in an independent apartment at [place]. Pt. is receiving Physical therapy, speech therapy, and now presents for occupational therapy services.  Pt. volunteers two days a week at [facility].   Currently in Pain? No/denies      OT TREATMENT    Neuro muscular re-education:  Pt. Worked on grasping pegs from a resistive mat using resistive tweezers placed on a vertical angle. Emphasis was placed on maintaining lateral pinch on the resistive tweezers.  Therapeutic Exercise:  Pt. performed gross gripping with grip strengthener. Pt. worked on sustaining grip while grasping pegs and reaching at various heights. Gripper was placed in the 2nd resistive slot with the white resistive spring. Pt. Worked on pinch strengthening in the left hand for lateral, and 3pt. pinch using yellow, red, green, and blue resistive clips. Pt. worked on placing the clips at various vertical and horizontal angles. Tactile and verbal cues were required for eliciting the desired movement. Pt. performed resistive EZ Board exercises for forearm supination/pronation, wrist flexion/extension using gross grasp, and lateral pinch (key) grasp. Pt. performed resistive EZ Board exercises angled in several planes to promote shoulder  flexion, abduction, and wrist flexion, and extension while performing resistive wrist flexion and extension with a gross grip. Pt. Worked with both the right, and left hands.                              OT Education - 10/09/16 1504    Education provided Yes   Education Details Pt. education was provided about pinch, and grip strength, and hand position.   Person(s) Educated Patient   Methods Explanation;Demonstration   Comprehension Returned demonstration;Verbalized understanding             OT Long Term Goals - 10/03/16 1710      OT LONG TERM GOAL #1   Title Pt. will improve pinch strength to be able to independently hang/clip pants on a hanger,   Baseline Pt. has difficulty   Time 8   Period Weeks   Status New     OT LONG TERM GOAL #2   Title Pt. will improve grip strength to be able to open water bottles.   Baseline Pt. has difficulty   Time 8   Period Weeks   Status New     OT LONG TERM GOAL #3   Title Pt. will improve coordination to be able to clip nails independently   Baseline Pt. has difficulty   Time 8   Period Weeks   Status New     OT LONG TERM GOAL #4   Title Pt. will demontrate work simplification techniques for home management/IADLs   Baseline Pt. has difficulty   Time 8   Period Weeks   Status New               Plan - 10/09/16 1507    Clinical Impression Statement Pt. continues to present with decreased grip strength, Pinch strength, and coordination skills. Pt. continues to have difficulty pinching pant clips when hanging pants on a hanger. Pt. continues to have difficulty manipulating buttons on pants, and opening water bottles. Pt. continues to benefit from skilled OT services to work on improving hand function and strength for ADls/IADLs.   Rehab Potential Good   OT Frequency 2x / week   OT Treatment/Interventions Self-care/ADL training;Moist Heat;Therapeutic exercise;Neuromuscular education;Energy  conservation;Manual Therapy;Therapeutic exercises;Patient/family education;DME and/or AE instruction;Therapeutic activities   Consulted and Agree with Plan of Care Patient      Patient will benefit from skilled therapeutic intervention in order to improve the following deficits and impairments:  Decreased coordination, Decreased strength, Impaired UE functional use, Impaired tone, Decreased activity tolerance  Visit Diagnosis: Muscle weakness (generalized)  Other lack of coordination    Problem List Patient Active Problem List   Diagnosis Date Noted  . Suprapubic pain 07/25/2016  . 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  History of melanoma 01/20/2016  . Coagulation test abnormality 01/20/2016  . Hematoma 01/14/2016  . Right hip pain 12/28/2015  . Fall 12/28/2015  . Routine general medical examination at a health care facility 12/16/2015  . Memory loss 12/16/2015  . Muscle cramps at night 12/16/2015  . Dysarthria 12/16/2015  . Expressive aphasia 12/16/2015  . Bereavement 11/18/2015  . Hoarseness 09/22/2015  . Special screening for malignant neoplasms, colon 09/22/2015  . Dyspnea on exertion 07/21/2015  . History of renal cell cancer 09/07/2014  . Urinary incontinence 09/07/2014  . Contact dermatitis 04/08/2014  . Stopped smoking with greater than 25 pack year history 02/25/2014  . Irregular heart beat 01/30/2014  . Allergic rhinitis 01/30/2014  . Medicare annual wellness visit, subsequent 08/08/2012  . Screening for breast cancer 08/08/2012  . Atrophic vaginitis 08/08/2012  . GERD (gastroesophageal reflux disease) 05/07/2012  . History of cardiovascular disorder 04/30/2009  . COLITIS, ULCERATIVE 05/16/2007  . Personal history of malignant neoplasm of breast 05/16/2007    Harrel Carina, MS, OTR/L 10/09/2016, 3:20 PM  [place] MAIN [facility] SERVICES [address]  [place], [place], [address] Phone: [phone]   Fax:  [phone]  Name: Mary Fields MRN: [id number] Date of Birth: 01-09-1941

## 2016-10-09 NOTE — Patient Instructions (Signed)
OT TREATMENT    Neuro muscular re-education:  Pt. Worked on grasping pegs from a resistive mat using resistive tweezers placed on a vertical angle. Emphasis was placed on maintaining lateral pinch on the resistive tweezers.  Therapeutic Exercise:  Pt. performed gross gripping with grip strengthener. Pt. worked on sustaining grip while grasping pegs and reaching at various heights. Gripper was placed in the 2nd resistive slot with the white resistive spring. Pt. Worked on pinch strengthening in the left hand for lateral, and 3pt. pinch using yellow, red, green, and blue resistive clips. Pt. worked on placing the clips at various vertical and horizontal angles. Tactile and verbal cues were required for eliciting the desired movement. Pt. performed resistive EZ Board exercises for forearm supination/pronation, wrist flexion/extension using gross grasp, and lateral pinch (key) grasp. Pt. performed resistive EZ Board exercises angled in several planes to promote shoulder flexion, abduction, and wrist flexion, and extension while performing resistive wrist flexion and extension with a gross grip. Pt. Worked with both the right, and left hands.

## 2016-10-10 ENCOUNTER — Ambulatory Visit: Payer: PPO

## 2016-10-10 ENCOUNTER — Encounter: Payer: Self-pay | Admitting: Speech Pathology

## 2016-10-10 DIAGNOSIS — R1312 Dysphagia, oropharyngeal phase: Secondary | ICD-10-CM | POA: Diagnosis not present

## 2016-10-10 DIAGNOSIS — R0602 Shortness of breath: Secondary | ICD-10-CM | POA: Diagnosis not present

## 2016-10-10 DIAGNOSIS — R471 Dysarthria and anarthria: Secondary | ICD-10-CM | POA: Diagnosis not present

## 2016-10-10 DIAGNOSIS — R062 Wheezing: Secondary | ICD-10-CM | POA: Diagnosis not present

## 2016-10-10 DIAGNOSIS — M6281 Muscle weakness (generalized): Secondary | ICD-10-CM | POA: Diagnosis not present

## 2016-10-10 DIAGNOSIS — Z79899 Other long term (current) drug therapy: Secondary | ICD-10-CM | POA: Diagnosis not present

## 2016-10-10 DIAGNOSIS — G1221 Amyotrophic lateral sclerosis: Secondary | ICD-10-CM | POA: Diagnosis not present

## 2016-10-10 DIAGNOSIS — Z87891 Personal history of nicotine dependence: Secondary | ICD-10-CM | POA: Diagnosis not present

## 2016-10-10 DIAGNOSIS — R252 Cramp and spasm: Secondary | ICD-10-CM | POA: Diagnosis not present

## 2016-10-10 DIAGNOSIS — G709 Myoneural disorder, unspecified: Secondary | ICD-10-CM | POA: Diagnosis not present

## 2016-10-10 DIAGNOSIS — J984 Other disorders of lung: Secondary | ICD-10-CM | POA: Diagnosis not present

## 2016-10-10 DIAGNOSIS — J4520 Mild intermittent asthma, uncomplicated: Secondary | ICD-10-CM | POA: Diagnosis not present

## 2016-10-10 NOTE — Therapy (Signed)
[place] MAIN [facility] SERVICES [address] [place], [place], [address] Phone: [phone]   Fax:  [phone]  Speech Language Pathology Treatment  Patient Details  Name: Mary Fields MRN: [id number] Date of Birth: 1940-12-04 Referring Provider: Alda Berthold  Encounter Date: 10/09/2016      End of Session - 10/10/16 1016    Visit Number 2   Number of Visits 5   Date for SLP Re-Evaluation 10/27/16   SLP Start Time 1500   SLP Stop Time  1550   SLP Time Calculation (min) 50 min      Past Medical History:  Diagnosis Date  . 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 Atypical nevi   . Benign neoplasm of skin, site unspecified    Dr. Aubery Lapping  . Colitis   . 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 Disorder of bone and cartilage, unspecified   . Diverticulosis   . Dizziness and giddiness   . Dyspnea on exertion   . Erosive esophagitis   . Esophageal reflux   . Herpes simplex without mention of complication   . Hip fracture ([place]) 11/12   surgery  . Hx-TIA (transient ischemic attack)   . Malignant neoplasm of kidney, except pelvis   . 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 Other malaise and fatigue   . Palpitations   . Palpitations    a. 07/2015 Echo: EF 60-65%, Gr 1 DD, mild AI.  Marland Kitchen Personal history of malignant neoplasm of breast   . Personal history of other malignant neoplasm of skin   . Tobacco abuse   . Ulcerative colitis, unspecified   . Unspecified asthma(493.90)    since childhood  . Vaginal atrophy   . Vaginal stenosis     Past Surgical History:  Procedure Laterality Date  . ABDOMINAL HYSTERECTOMY  1986   endometriosis, Dr. Pearletha Furl  . APPENDECTOMY    . BREAST LUMPECTOMY Right 1995   right breast cancer, lumpectomy and XRT and chemo  . COLONOSCOPY  4/10   diverticulosis; recheck 5 years  . ESOPHAGOGASTRODUODENOSCOPY  2002   esophagitis; grade 1, erosive  . HIP FRACTURE  SURGERY Right 2005   hemiarthroplasty right, Dr. Francia Greaves  . HIP FRACTURE SURGERY Left 11/12   after fall at church  . MELANOMA EXCISION  1980   Dr. Sharlet Salina  . NEPHRECTOMY  2000   left renal cell carcinoma, Dr. Jacqlyn Larsen    There were no vitals filed for this visit.      Subjective Assessment - 10/10/16 1015    Subjective "That's not going to work" RE: most suggestions offered.   Currently in Pain? No/denies               ADULT SLP TREATMENT - 10/10/16 0001      General Information   Behavior/Cognition Alert;Cooperative;Pleasant mood     Treatment Provided   Treatment provided Dysphagia;Cognitive-Linquistic     Pain Assessment   Pain Assessment No/denies pain     Cognitive-Linquistic Treatment   Treatment focused on Dysarthria;Other (comment)  Dysphagia   Skilled Treatment SWALLOWING: Patient complains of food getting trapped in lateral sulcus.  She states that only a finger sweep will dislodge.  Patient reports that it is dry foods vs. moist foods.  We discussed various means to deal with the issue: moisten foods with sauces/gravies/salad dressings; apply external pressure to cheek and suck the food onto the tongue; practice lingual sweep using  a (clean) button tied with dental floss.  NUTRITION: the patient states that she is losing weight despite using supplements.  She describes small meals and limited intake (eg. breakfast and supper only for most days). SPEECH: The patient's speech is much stronger today that the last time I saw her.     Assessment / Recommendations / Plan   Plan Continue with current plan of care     Progression Toward Goals   Progression toward goals Progressing toward goals          SLP Education - 10/10/16 1015    Education provided Yes   Education Details Swallowing recommendations   Person(s) Educated Patient   Methods Explanation   Comprehension Verbalized understanding            SLP Long Term Goals - 09/27/16 1540      SLP  LONG TERM GOAL #1   Title The patient will demonstrate knowledge and use of strategies to improve intelligibility and energy conservation.   Time 1   Period Weeks   Status New          Plan - 10/10/16 1016    Clinical Impression Statement The patient is using the swallowing strategies learned at [facility] for liquids but is complaining of foods getting trapped in her lateral sulci.  She was offered several suggestions/recommendations.  Will review the Dysphagia 3 description with her the next time she is here. The patient has been advised that I can help with day-to-day issues regarding speech and swallowing, but she is advised to have a specialist (ie. [place] SLP) guide her speech therapy course.   Speech Therapy Frequency 1x /week   Duration 4 weeks   Treatment/Interventions Patient/family education   Potential to Achieve Goals Good   Potential Considerations Ability to learn/carryover information;Cooperation/participation level;Previous level of function;Family/community support   SLP Home Exercise Plan Overarticulation, short phrases, energy conservation, swallowing suggestions   Consulted and Agree with Plan of Care Patient      Patient will benefit from skilled therapeutic intervention in order to improve the following deficits and impairments:   Dysarthria and anarthria    Problem List Patient Active Problem List   Diagnosis Date Noted  . Suprapubic pain 07/25/2016  . 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 History of melanoma 01/20/2016  . Coagulation test abnormality 01/20/2016  . Hematoma 01/14/2016  . Right hip pain 12/28/2015  . Fall 12/28/2015  . Routine general medical examination at a health care facility 12/16/2015  . Memory loss 12/16/2015  . Muscle cramps at night 12/16/2015  . Dysarthria 12/16/2015  . Expressive aphasia 12/16/2015  . Bereavement 11/18/2015  . Hoarseness 09/22/2015  .  Special screening for malignant neoplasms, colon 09/22/2015  . Dyspnea on exertion 07/21/2015  . History of renal cell cancer 09/07/2014  . Urinary incontinence 09/07/2014  . Contact dermatitis 04/08/2014  . Stopped smoking with greater than 25 pack year history 02/25/2014  . Irregular heart beat 01/30/2014  . Allergic rhinitis 01/30/2014  . Medicare annual wellness visit, subsequent 08/08/2012  . Screening for breast cancer 08/08/2012  . Atrophic vaginitis 08/08/2012  . GERD (gastroesophageal reflux disease) 05/07/2012  . History of cardiovascular disorder 04/30/2009  . COLITIS, ULCERATIVE 05/16/2007  . Personal history of malignant neoplasm of breast 05/16/2007   Leroy Sea, MS/CCC- SLP  Lou Miner 10/10/2016, 10:19 AM  [place] MAIN [facility] SERVICES [place], [place], [address] Phone: [phone]   Fax:  [phone]   Name: TILLIE VANSANT MRN: [id number] Date of Birth: 1941-05-07

## 2016-10-11 ENCOUNTER — Encounter: Payer: Self-pay | Admitting: Neurology

## 2016-10-12 ENCOUNTER — Encounter: Payer: PPO | Admitting: Occupational Therapy

## 2016-10-12 ENCOUNTER — Ambulatory Visit: Payer: PPO

## 2016-10-18 ENCOUNTER — Ambulatory Visit: Payer: PPO | Admitting: Physical Therapy

## 2016-10-18 ENCOUNTER — Encounter: Payer: PPO | Admitting: Occupational Therapy

## 2016-10-20 ENCOUNTER — Encounter: Payer: PPO | Admitting: Speech Pathology

## 2016-10-24 ENCOUNTER — Ambulatory Visit: Payer: PPO | Admitting: Physical Therapy

## 2016-10-24 ENCOUNTER — Encounter: Payer: PPO | Admitting: Occupational Therapy

## 2016-10-26 ENCOUNTER — Encounter: Payer: PPO | Admitting: Occupational Therapy

## 2016-10-26 ENCOUNTER — Ambulatory Visit: Payer: PPO | Admitting: Physical Therapy

## 2016-10-26 ENCOUNTER — Encounter: Payer: PPO | Admitting: Speech Pathology

## 2016-10-31 ENCOUNTER — Encounter: Payer: Self-pay | Admitting: Neurology

## 2016-10-31 ENCOUNTER — Ambulatory Visit: Payer: PPO | Admitting: Physical Therapy

## 2016-10-31 ENCOUNTER — Encounter: Payer: PPO | Admitting: Speech Pathology

## 2016-11-01 ENCOUNTER — Ambulatory Visit: Payer: PPO | Admitting: Neurology

## 2016-11-02 ENCOUNTER — Ambulatory Visit: Payer: PPO | Admitting: Physical Therapy

## 2016-11-02 ENCOUNTER — Ambulatory Visit: Payer: PPO

## 2016-11-02 ENCOUNTER — Encounter: Payer: PPO | Admitting: Occupational Therapy

## 2016-11-02 DIAGNOSIS — G1221 Amyotrophic lateral sclerosis: Secondary | ICD-10-CM | POA: Diagnosis not present

## 2016-11-07 ENCOUNTER — Ambulatory Visit: Payer: PPO | Admitting: Physical Therapy

## 2016-11-09 ENCOUNTER — Ambulatory Visit: Payer: PPO | Admitting: Physical Therapy

## 2016-11-14 ENCOUNTER — Ambulatory Visit: Payer: PPO | Admitting: Physical Therapy

## 2016-11-16 ENCOUNTER — Ambulatory Visit: Payer: PPO | Admitting: Physical Therapy

## 2016-12-03 DIAGNOSIS — G1221 Amyotrophic lateral sclerosis: Secondary | ICD-10-CM | POA: Diagnosis not present

## 2016-12-25 ENCOUNTER — Encounter: Payer: Self-pay | Admitting: Family Medicine

## 2016-12-26 ENCOUNTER — Encounter: Payer: Self-pay | Admitting: Family Medicine

## 2016-12-28 ENCOUNTER — Encounter: Payer: Self-pay | Admitting: Family Medicine

## 2016-12-28 ENCOUNTER — Ambulatory Visit (INDEPENDENT_AMBULATORY_CARE_PROVIDER_SITE_OTHER): Payer: PPO | Admitting: Family Medicine

## 2016-12-28 DIAGNOSIS — J329 Chronic sinusitis, unspecified: Secondary | ICD-10-CM | POA: Insufficient documentation

## 2016-12-28 DIAGNOSIS — J0100 Acute maxillary sinusitis, unspecified: Secondary | ICD-10-CM | POA: Diagnosis not present

## 2016-12-28 IMAGING — CR DG HIP (WITH OR WITHOUT PELVIS) 2-3V*R*
1 series · 3 of 3 positions shown · non-contrast
Comparison: None.

CLINICAL DATA: Status post fall 2 days ago with right hip pain and
bruising.

EXAM:
DG HIP (WITH OR WITHOUT PELVIS) 2-3V RIGHT

[Series 1: t pelvis ap · 0.14mm/px · 3 of 3 slices shown]
[im 1/3]
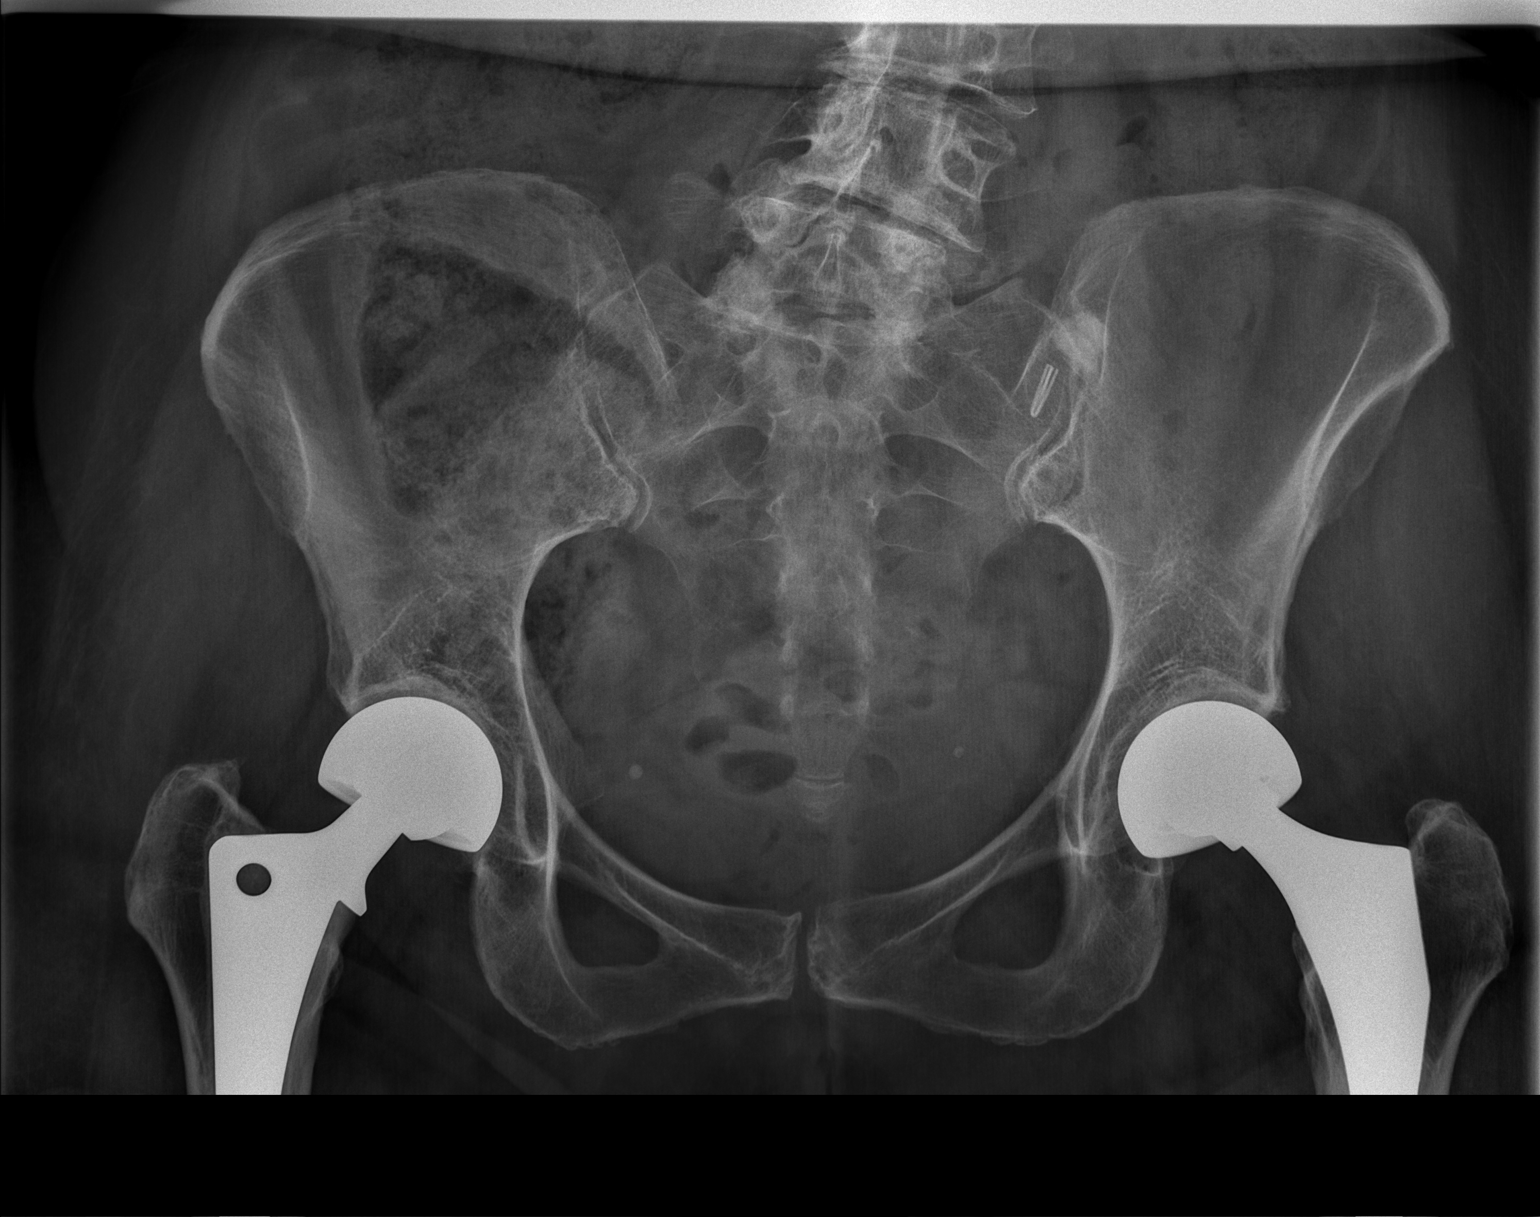
[im 2/3]
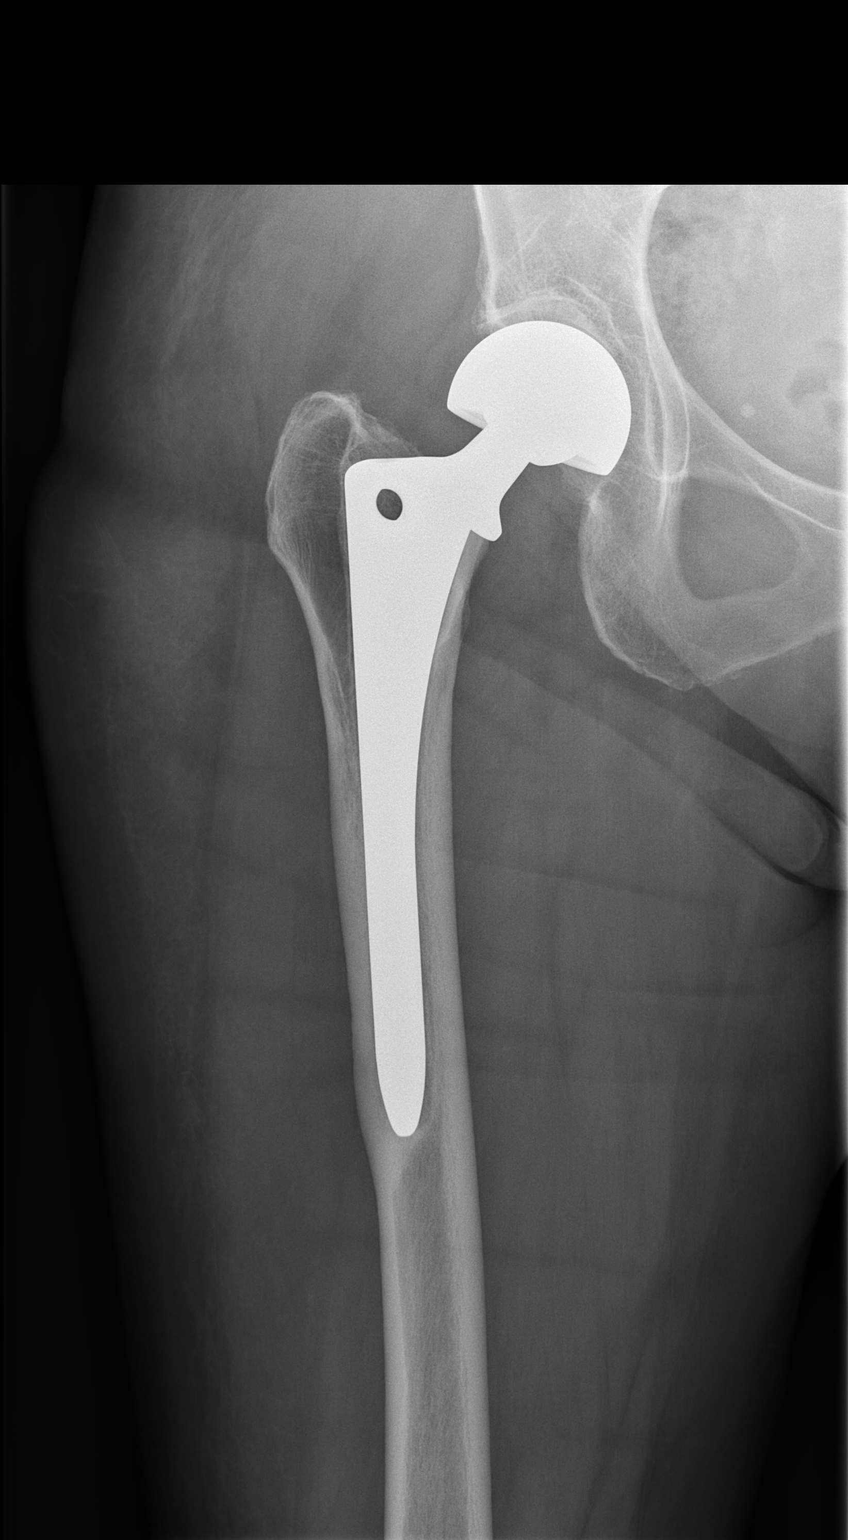
[im 3/3]
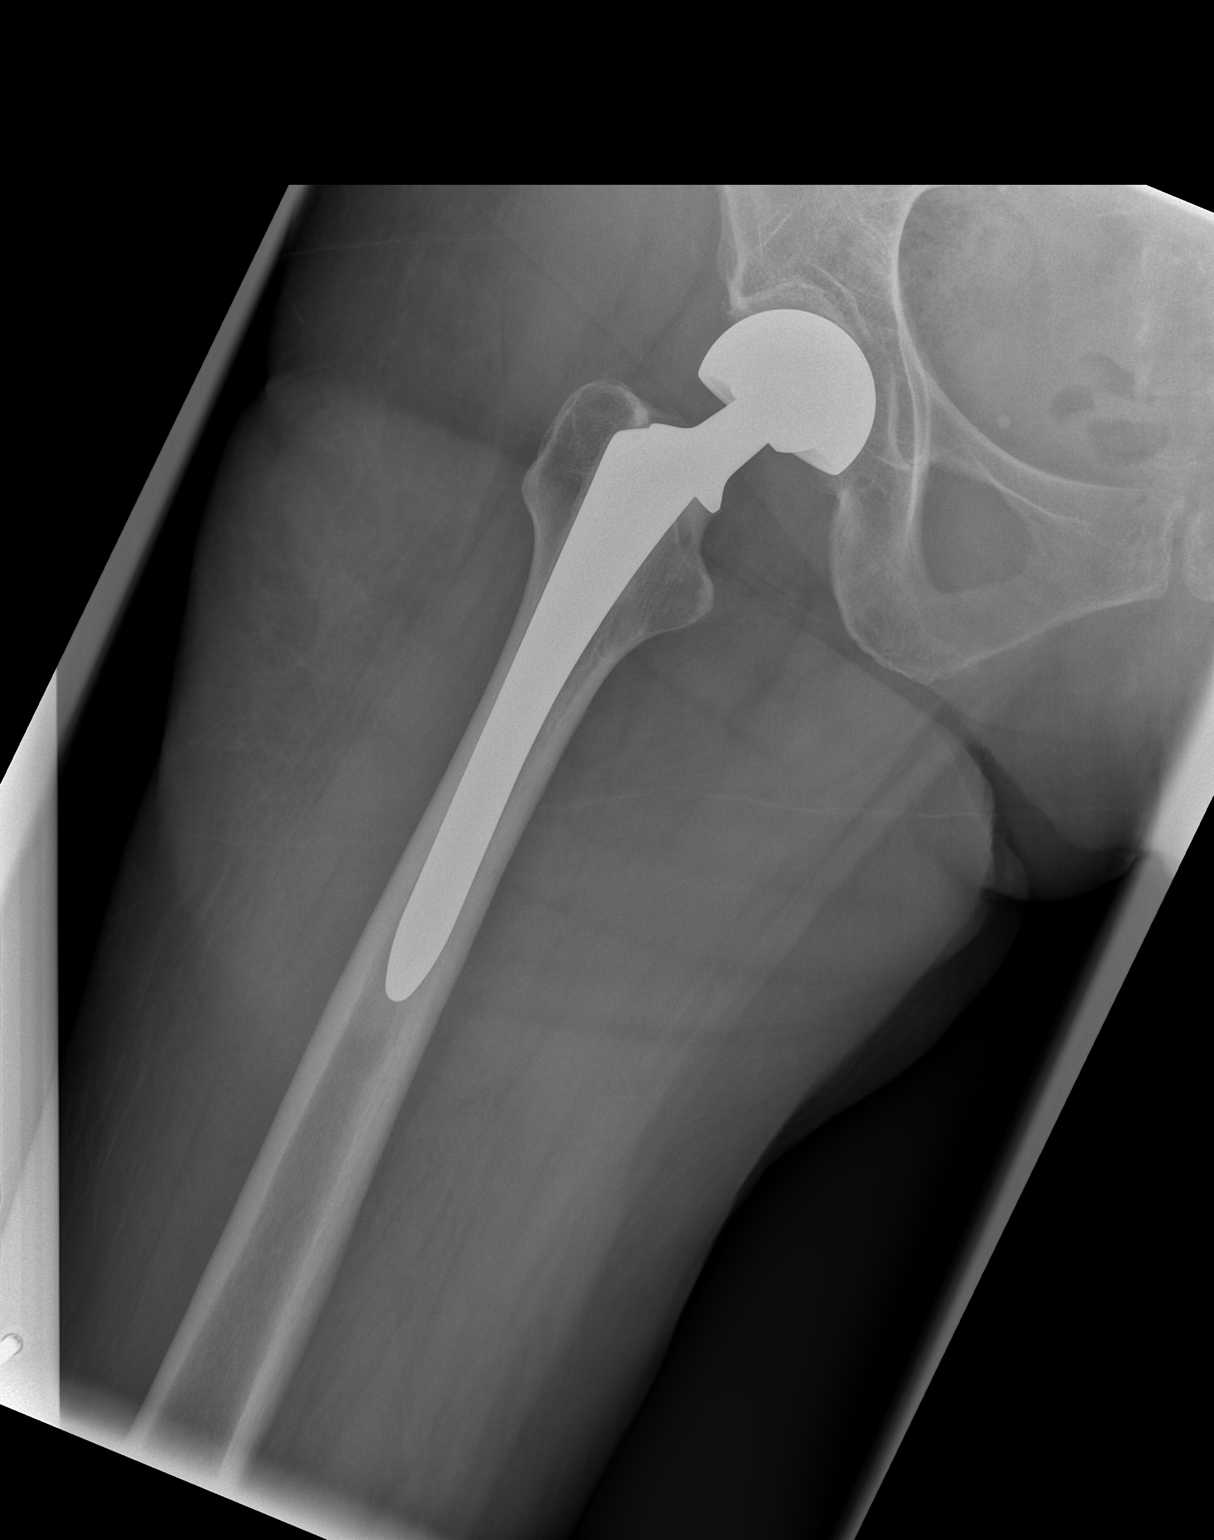

[3 of 3 positions shown; findings below may reference images not displayed]

FINDINGS: There is no evidence of hip fracture or dislocation. Bilateral hip
replacements are identified without malalignment. Degenerative joint
changes of the spine are noted.
IMPRESSION: No acute abnormality identified.

## 2016-12-28 IMAGING — CR DG CHEST 2V
1 series · 2 of 2 positions shown · non-contrast
Comparison: September 10, 2015

CLINICAL DATA: Right-sided rib pain after fall 2 days ago.

EXAM:
CHEST  2 VIEW

[Series 1: w chest pa · 0.14mm/px · 2 of 2 slices shown]
[im 1/2]
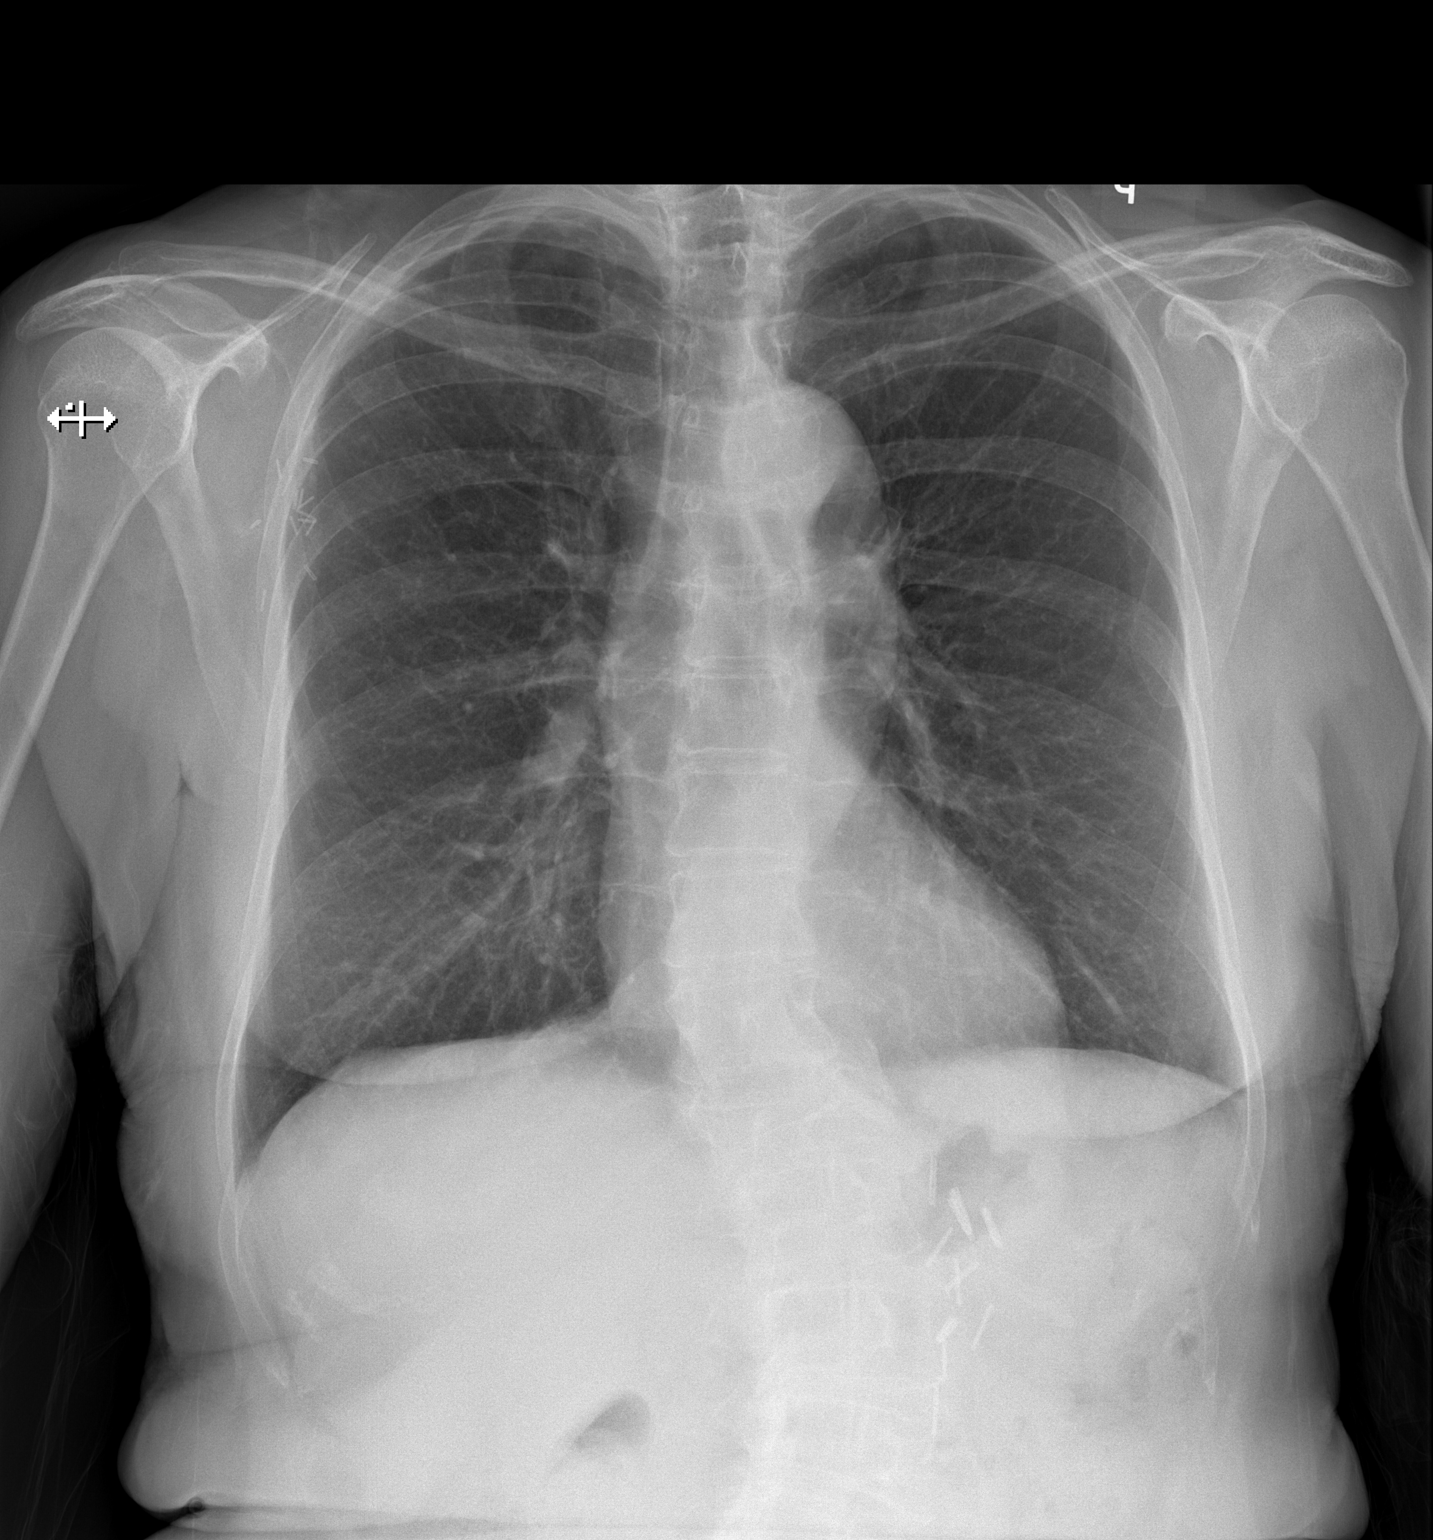
[im 2/2]
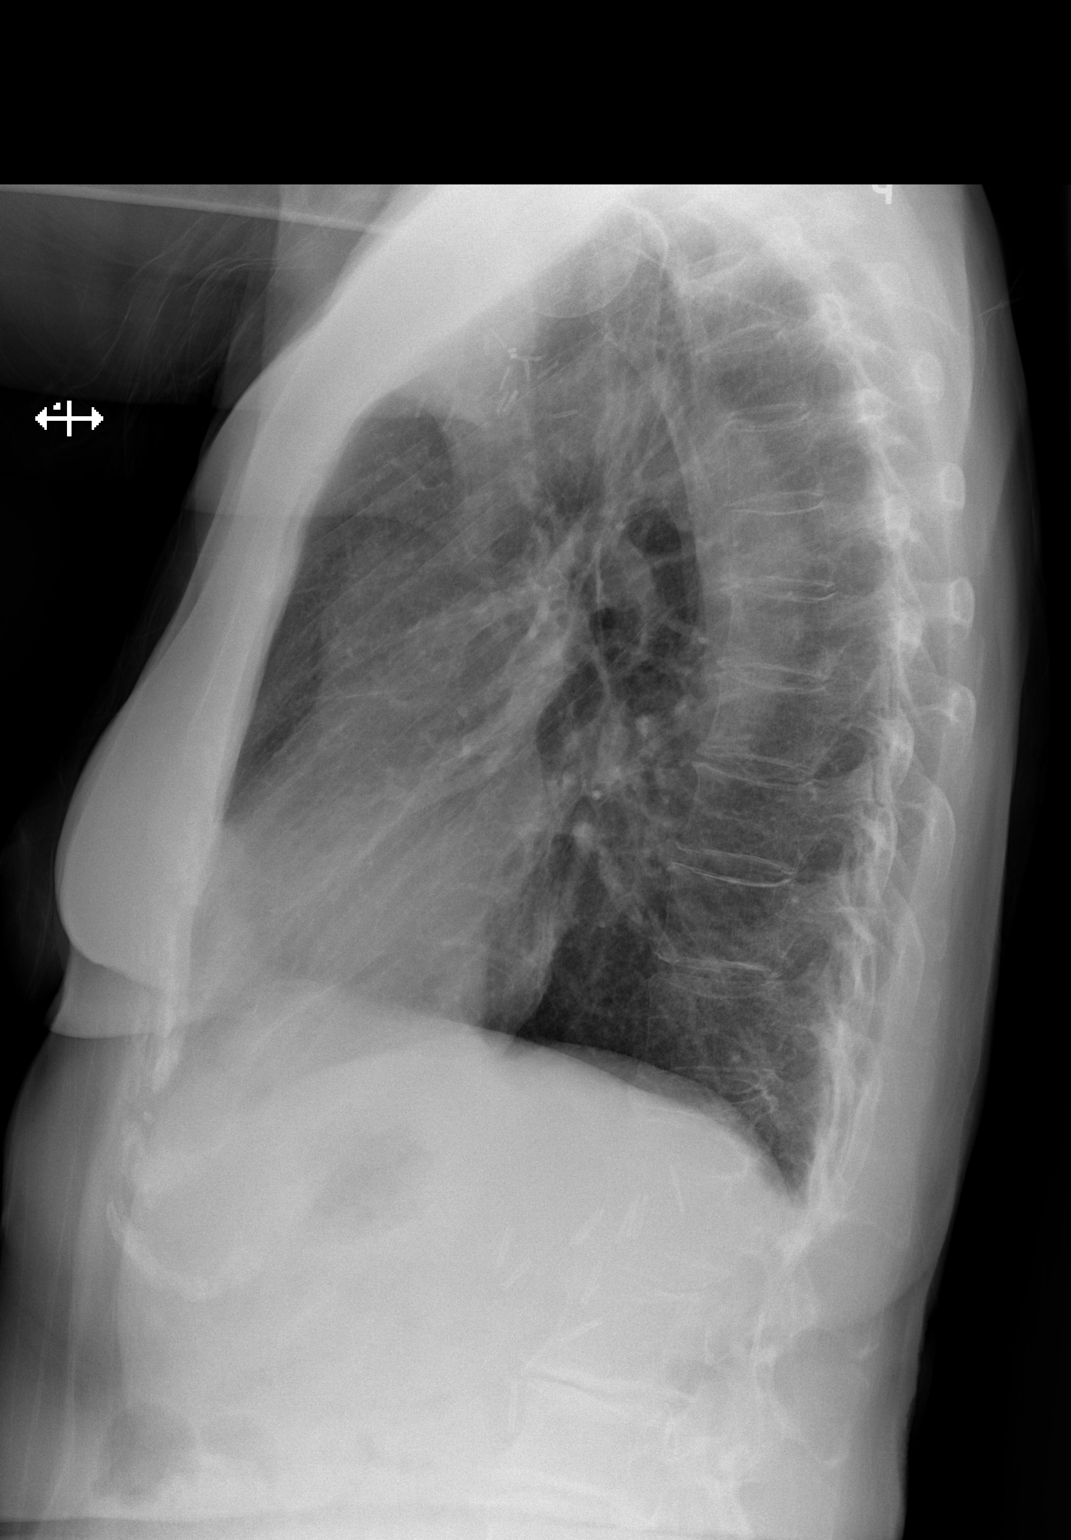

[2 of 2 positions shown; findings below may reference images not displayed]

FINDINGS: The heart size and mediastinal contours are within normal limits.
Both lungs are clear. No pneumothorax or pleural effusion is noted.
Right axillary surgical clips are noted. The visualized skeletal
structures are unremarkable.
IMPRESSION: No active cardiopulmonary disease.

## 2016-12-28 MED ORDER — AMOXICILLIN-POT CLAVULANATE 875-125 MG PO TABS: 1.0000 | ORAL_TABLET | Freq: Two times a day (BID) | ORAL | 0 refills | Status: DC

## 2016-12-28 MED ORDER — BENZONATATE 200 MG PO CAPS: 200.0000 mg | ORAL_CAPSULE | Freq: Two times a day (BID) | ORAL | 0 refills | Status: DC | PRN

## 2016-12-28 NOTE — Progress Notes (Signed)
  Tommi Rumps, MD Phone: [phone]  Mary Fields is a 76 y.o. female who presents today for same-day visit.  Patient notes onset of symptoms 6 days ago. Started with sore throat and then developed cough and maxillary sinus congestion and chest congestion. Cough is nonproductive. She is blowing clear/green mucus out of her nose. She notes MAXIMUM TEMPERATURE of 98.31F. She has had some chills. Notes her breathing is stable and she has stable shortness of breath related to her ALS. She's tried Robitussin and Alka-Seltzer with little benefit.  PMH: Former smoker.   ROS see history of present illness  Objective  Physical Exam Vitals:   12/28/16 0811  BP: 110/68  Pulse: 84  Temp: 97.9 F (36.6 C)    BP Readings from Last 3 Encounters:  12/28/16 110/68  09/20/16 120/70  09/07/16 121/68   Wt Readings from Last 3 Encounters:  12/28/16 133 lb 6.4 oz (60.5 kg)  09/20/16 135 lb 2 oz (61.3 kg)  07/25/16 134 lb 4 oz (60.9 kg)    Physical Exam  Constitutional: No distress.  HENT:  Head: Normocephalic and atraumatic.  Mild posterior oropharyngeal erythema with postnasal drip noted, normal TMs bilaterally, bilateral maxillary sinus tenderness to percussion  Eyes: Conjunctivae are normal. Pupils are equal, round, and reactive to light.  Neck: Neck supple.  Cardiovascular: Normal rate, regular rhythm and normal heart sounds.   Pulmonary/Chest: Effort normal and breath sounds normal.  Musculoskeletal: She exhibits no edema.  Lymphadenopathy:    She has no cervical adenopathy.  Neurological: She is alert.  Skin: Skin is warm and dry. She is not diaphoretic.     Assessment/Plan: Please see individual problem list.  Sinusitis Patient's symptoms most consistent with maxillary sinusitis. She has a benign lung exam and vital signs are stable. Given severity and lack of improvement over the last 6 days we will treat with Augmentin particularly given her low reserve given her  ALS. Tessalon for cough. Advised on probiotic. Given return precautions.   No orders of the defined types were placed in this encounter.   Meds ordered this encounter  Medications  . amoxicillin-clavulanate (AUGMENTIN) 875-125 MG tablet    Sig: Take 1 tablet by mouth 2 (two) times daily.    Dispense:  14 tablet    Refill:  0  . benzonatate (TESSALON) 200 MG capsule    Sig: Take 1 capsule (200 mg total) by mouth 2 (two) times daily as needed for cough.    Dispense:  20 capsule    Refill:  0    Tommi Rumps, MD [place]

## 2016-12-28 NOTE — Progress Notes (Signed)
Pre visit review using our clinic review tool, if applicable. No additional management support is needed unless otherwise documented below in the visit note. 

## 2016-12-28 NOTE — Patient Instructions (Signed)
Nice to see you. You likely have a sinus infection. We will treat you with Augmentin for this. You may take the Tessalon for cough. If you develop fevers, cough productive of blood, or worsening breathing issues please seek medical attention immediately.

## 2016-12-28 NOTE — Assessment & Plan Note (Signed)
Patient's symptoms most consistent with maxillary sinusitis. She has a benign lung exam and vital signs are stable. Given severity and lack of improvement over the last 6 days we will treat with Augmentin particularly given her low reserve given her ALS. Tessalon for cough. Advised on probiotic. Given return precautions.

## 2016-12-31 DIAGNOSIS — G1221 Amyotrophic lateral sclerosis: Secondary | ICD-10-CM | POA: Diagnosis not present

## 2017-01-01 ENCOUNTER — Ambulatory Visit (INDEPENDENT_AMBULATORY_CARE_PROVIDER_SITE_OTHER): Payer: PPO | Admitting: Family Medicine

## 2017-01-01 ENCOUNTER — Encounter: Payer: Self-pay | Admitting: Family Medicine

## 2017-01-01 DIAGNOSIS — G1221 Amyotrophic lateral sclerosis: Secondary | ICD-10-CM | POA: Diagnosis not present

## 2017-01-01 DIAGNOSIS — J0100 Acute maxillary sinusitis, unspecified: Secondary | ICD-10-CM | POA: Diagnosis not present

## 2017-01-01 NOTE — Assessment & Plan Note (Signed)
Improving. Advised to continue Augmentin. Discussed that she may return to her usual activities if she is feeling up to it. If she does not continue to improve she'll follow-up.

## 2017-01-01 NOTE — Progress Notes (Signed)
  Tommi Rumps, MD Phone: [phone]  Mary Fields is a 76 y.o. female who presents today for follow-up.  She was seen last week for sinus infection. She notes she is doing somewhat better. Less congestion. She is now blowing clear mucus out of her nose. Cough is not as bad. Cough is nonproductive. Her breathing is stable. She does have breathing issues related to her ALS. She is currently on Augmentin.  Patient also notes that she does not know what the next step regarding her ALS is. She notes her ALS has been stable and overall is doing well. She is just unsure of what the next step is.  ROS see history of present illness  Objective  Physical Exam Vitals:   01/01/17 0836  BP: 122/82  Pulse: 78  Temp: 97.6 F (36.4 C)    BP Readings from Last 3 Encounters:  01/01/17 122/82  12/28/16 110/68  09/20/16 120/70   Wt Readings from Last 3 Encounters:  01/01/17 133 lb (60.3 kg)  12/28/16 133 lb 6.4 oz (60.5 kg)  09/20/16 135 lb 2 oz (61.3 kg)    Physical Exam  Constitutional: No distress.  HENT:  Head: Normocephalic and atraumatic.  Mouth/Throat: Oropharynx is clear and moist. No oropharyngeal exudate.  Normal TMs bilaterally  Eyes: Conjunctivae are normal. Pupils are equal, round, and reactive to light.  Cardiovascular: Normal rate, regular rhythm and normal heart sounds.   Pulmonary/Chest: Effort normal and breath sounds normal.  Neurological: She is alert.  Skin: She is not diaphoretic.     Assessment/Plan: Please see individual problem list.  Sinusitis Improving. Advised to continue Augmentin. Discussed that she may return to her usual activities if she is feeling up to it. If she does not continue to improve she'll follow-up.  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 I did advise that per their notes it appears that they have discussed  tracheostomy and ventilation as well as PEG tube placement. She has been against these things. She follows up with them later this month.   Tommi Rumps, MD [place]

## 2017-01-01 NOTE — Assessment & Plan Note (Signed)
Discussed that she needs to see her neurologist and team at [facility] to determine what the next step in her management is. I did advise that per their notes it appears that they have discussed tracheostomy and ventilation as well as PEG tube placement. She has been against these things. She follows up with them later this month.

## 2017-01-01 NOTE — Patient Instructions (Signed)
Nice to see you. You are doing better with regards to your sinusitis. Please finish the course of antibiotics. You can take the Tessalon if you need for cough. I would encourage you to discuss the next step in your care with the neurologists at [facility].

## 2017-01-01 NOTE — Progress Notes (Signed)
Pre visit review using our clinic review tool, if applicable. No additional management support is needed unless otherwise documented below in the visit note. 

## 2017-01-02 ENCOUNTER — Emergency Department
Admission: EM | Admit: 2017-01-02 | Discharge: 2017-01-03 | Disposition: A | Discharge status: Home / Self Care | Payer: PPO | Attending: Emergency Medicine | Admitting: Emergency Medicine

## 2017-01-02 ENCOUNTER — Telehealth: Payer: Self-pay | Admitting: *Deleted

## 2017-01-02 ENCOUNTER — Encounter: Payer: Self-pay | Admitting: Emergency Medicine

## 2017-01-02 DIAGNOSIS — G1221 Amyotrophic lateral sclerosis: Secondary | ICD-10-CM | POA: Insufficient documentation

## 2017-01-02 DIAGNOSIS — F99 Mental disorder, not otherwise specified: Secondary | ICD-10-CM | POA: Diagnosis not present

## 2017-01-02 DIAGNOSIS — Z79899 Other long term (current) drug therapy: Secondary | ICD-10-CM | POA: Diagnosis not present

## 2017-01-02 DIAGNOSIS — Z87891 Personal history of nicotine dependence: Secondary | ICD-10-CM | POA: Diagnosis not present

## 2017-01-02 DIAGNOSIS — Z7982 Long term (current) use of aspirin: Secondary | ICD-10-CM | POA: Insufficient documentation

## 2017-01-02 DIAGNOSIS — R45851 Suicidal ideations: Secondary | ICD-10-CM | POA: Diagnosis not present

## 2017-01-02 DIAGNOSIS — Z046 Encounter for general psychiatric examination, requested by authority: Secondary | ICD-10-CM | POA: Diagnosis not present

## 2017-01-02 DIAGNOSIS — Z743 Need for continuous supervision: Secondary | ICD-10-CM | POA: Diagnosis not present

## 2017-01-02 LAB — COMPREHENSIVE METABOLIC PANEL
ALT: 12 U/L — ABNORMAL LOW (ref 14–54)
AST: 23 U/L (ref 15–41)
Albumin: 3.8 g/dL (ref 3.5–5.0)
Alkaline Phosphatase: 50 U/L (ref 38–126)
Anion gap: 8 (ref 5–15)
BUN: 27 mg/dL — ABNORMAL HIGH (ref 6–20)
CHLORIDE: 104 mmol/L (ref 101–111)
CO2: 26 mmol/L (ref 22–32)
Calcium: 9.2 mg/dL (ref 8.9–10.3)
Creatinine, Ser: 0.98 mg/dL (ref 0.44–1.00)
GFR, EST NON AFRICAN AMERICAN: 54 mL/min — AB (ref >60)
Glucose, Bld: 116 mg/dL — ABNORMAL HIGH (ref 65–99)
Potassium: 3.5 mmol/L (ref 3.5–5.1)
SODIUM: 138 mmol/L (ref 135–145)
Total Bilirubin: 0.5 mg/dL (ref 0.3–1.2)
Total Protein: 7.1 g/dL (ref 6.5–8.1)

## 2017-01-02 LAB — URINE DRUG SCREEN, QUALITATIVE (ARMC ONLY)
AMPHETAMINES, UR SCREEN: NOT DETECTED
Barbiturates, Ur Screen: NOT DETECTED
Benzodiazepine, Ur Scrn: NOT DETECTED
CANNABINOID 50 NG, UR ~~LOC~~: NOT DETECTED
COCAINE METABOLITE, UR ~~LOC~~: NOT DETECTED
MDMA (ECSTASY) UR SCREEN: NOT DETECTED
Methadone Scn, Ur: NOT DETECTED
Opiate, Ur Screen: NOT DETECTED
Phencyclidine (PCP) Ur S: NOT DETECTED
Tricyclic, Ur Screen: NOT DETECTED

## 2017-01-02 LAB — CBC
HCT: 42.3 % (ref 35.0–47.0)
Hemoglobin: 14.1 g/dL (ref 12.0–16.0)
MCH: 31.7 pg (ref 26.0–34.0)
MCHC: 33.4 g/dL (ref 32.0–36.0)
MCV: 95.1 fL (ref 80.0–100.0)
Platelets: 243 10*3/uL (ref 150–440)
RBC: 4.44 MIL/uL (ref 3.80–5.20)
RDW: 16.9 % — ABNORMAL HIGH (ref 11.5–14.5)
WBC: 8 10*3/uL (ref 3.6–11.0)

## 2017-01-02 LAB — ETHANOL

## 2017-01-02 LAB — SALICYLATE LEVEL: Salicylate Lvl: <7 mg/dL (ref 2.8–30.0)

## 2017-01-02 LAB — ACETAMINOPHEN LEVEL: Acetaminophen (Tylenol), Serum: <10 ug/mL — ABNORMAL LOW (ref 10–30)

## 2017-01-02 NOTE — Telephone Encounter (Signed)
Mary Fields and informed her to call the ambulance to have patient evaluated at [facility] for suicidal ideation

## 2017-01-02 NOTE — Telephone Encounter (Signed)
Cecille Rubin (nurse)from [facility] [place], express concerns of patient being suicidal. Patient told another resident that she wanted to shoot herself in the head. After hearing this, from the resident this statement was made to, Cecille Rubin confronted patient. Patient denied this at first, however confessed that she did make the statement. Patient also turned a gun over to [place], in which she was not to have in the community. Cecille Rubin requested a call to discuss, care to treat patient suicidal issues. [place] [phone] or Cell: [phone]

## 2017-01-02 NOTE — BH Assessment (Signed)
Assessment Note  Mary Fields is an 76 y.o. female. Mary Fields arrived to the ED by way of EMS.  She reports that "The nurse came into her apartment with her boss, stating that Mary Fields ordered for me to come in tonight".  She denied symptoms of depression or anxiety.  She denied having auditory or visual hallucinations.  She denied suicidal or homicidal ideation or intent.  She reported that she has a diagnosis of ALS.  She shared that she told her girlfriend about a month ago, that she would shoot herself before the symptoms of ALS became too much for her.  She then reports that the friend told the staff at her residence on March 3rd that she was going to kill herself and she has a gun.  Mary Fields reports that she did have a very old gun, which she gave away this week and that she has no plans to harm herself.   TTS contacted [facility] [place] at [phone]. No one answered to provide additional information.  Diagnosis:   Past Medical History:  Past Medical History:  Diagnosis Date  . ALS (amyotrophic lateral sclerosis) (Mary Fields)   . ALS (amyotrophic lateral sclerosis) (Mary Fields)   . Atypical nevi   . Benign neoplasm of skin, site unspecified    Mary Fields  . Colitis   . 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 Disorder of bone and cartilage, unspecified   . Diverticulosis   . Dizziness and giddiness   . Dyspnea on exertion   . Erosive esophagitis   . Esophageal reflux   . Herpes simplex without mention of complication   . Hip fracture (Mary Fields) 11/12   surgery  . Hx-TIA (transient ischemic attack)   . Malignant neoplasm of kidney, except pelvis   . Motor neuron disease (Mary Fields)   . Other malaise and fatigue   . Palpitations   . Palpitations    a. 07/2015 Echo: EF 60-65%, Gr 1 DD, mild AI.  Mary Fields Kitchen Personal history of malignant neoplasm of breast   . Personal history of other malignant neoplasm of skin   . Tobacco abuse   . Ulcerative  colitis, unspecified   . Unspecified asthma(493.90)    since childhood  . Vaginal atrophy   . Vaginal stenosis     Past Surgical History:  Procedure Laterality Date  . ABDOMINAL HYSTERECTOMY  1986   endometriosis, Mary Fields  . APPENDECTOMY    . BREAST LUMPECTOMY Right 1995   right breast cancer, lumpectomy and XRT and chemo  . COLONOSCOPY  4/10   diverticulosis; recheck 5 years  . ESOPHAGOGASTRODUODENOSCOPY  2002   esophagitis; grade 1, erosive  . HIP FRACTURE SURGERY Right 2005   hemiarthroplasty right, Dr. Francia Fields  . HIP FRACTURE SURGERY Left 11/12   after fall at church  . MELANOMA EXCISION  1980   Mary Fields  . NEPHRECTOMY  2000   left renal cell carcinoma, Mary Fields    Family History:  Family History  Problem Relation Age of Onset  . Heart disease Paternal Grandmother   . Hypertension Paternal Grandmother   . Stomach cancer Maternal Grandmother   . Kidney disease Maternal Uncle   . Cancer Paternal Aunt     breast cancer    Social History:  reports that she quit smoking about 32 years ago. She has never used smokeless tobacco. She reports that she drinks alcohol. She reports that she does not use  drugs.  Additional Social History:  Alcohol / Drug Use History of alcohol / drug use?: No history of alcohol / drug abuse  CIWA: CIWA-Ar BP: (!) 187/90 Pulse Rate: 89 COWS:    Allergies:  Allergies  Allergen Reactions  . Prednisone Hives  . Sulfonamide Derivatives Other (See Comments)    Skin whelps  . Meperidine Other (See Comments)    Unspecified  . Ciprofloxacin Diarrhea    REACTION: diarrhea    Home Medications:  (Not in a Fields admission)  OB/GYN Status:  No LMP recorded. Patient has had a hysterectomy.  General Assessment Data Location of Assessment: Mary Fields ED TTS Assessment: In system Is this a Tele or Face-to-Face Assessment?: Face-to-Face Is this an Initial Assessment or a Re-assessment for this encounter?: Initial Assessment Marital  status: Widowed Mary Fields name: Mary Fields Is patient pregnant?: No Pregnancy Status: No Living Arrangements: Alone ([facility]  [phone]) Can pt return to current living arrangement?: Yes Admission Status: Voluntary Is patient capable of signing voluntary admission?: Yes Referral Source: Self/Family/Friend Insurance type: [employer] advantage  Medical Screening Exam ([place]) Medical Exam completed: Yes  Crisis Care Plan Living Arrangements: Alone ([facility] [place]  [phone]) Legal Guardian: Other: (Self) Name of Psychiatrist: None at the time Name of Therapist: None at the time  Education Status Is patient currently in school?: No Current Grade: n/a Highest grade of school patient has completed: 2 years of college Name of school: [employer] Contact person: n/a  Risk to self with the past 6 months Suicidal Ideation: No-Not Currently/Within Last 6 Months Has patient been a risk to self within the past 6 months prior to admission? : No Suicidal Intent: No Has patient had any suicidal intent within the past 6 months prior to admission? : No Is patient at risk for suicide?: No Suicidal Plan?: No Has patient had any suicidal plan within the past 6 months prior to admission? : Yes Access to Means: No (Reports that she gave her gun away) What has been your use of drugs/alcohol within the last 12 months?: Denied use Previous Attempts/Gestures: No How many times?: 0 Other Self Harm Risks: denied Triggers for Past Attempts: None known Intentional Self Injurious Behavior: None Family Suicide History: No Recent stressful life event(s): Other (Comment) (Illness) Persecutory voices/beliefs?: No Depression: No Depression Symptoms:  (denied symptoms of depression) Substance abuse history and/or treatment for substance abuse?: No Suicide prevention information given to non-admitted patients: Not  applicable  Risk to Others within the past 6 months Homicidal Ideation: No Does patient have any lifetime risk of violence toward others beyond the six months prior to admission? : No Thoughts of Harm to Others: No Current Homicidal Intent: No Current Homicidal Plan: No Access to Homicidal Means: No Identified Victim: None identified History of harm to others?: No Assessment of Violence: None Noted Does patient have access to weapons?: No (Reports that she gave her gun away) Criminal Charges Pending?: No Does patient have a court date: No Is patient on probation?: No  Psychosis Hallucinations: None noted Delusions: None noted  Mental Status Report Appearance/Hygiene: Unremarkable (In her sleeping clothes) Eye Contact: Good Motor Activity: Unremarkable Speech: Logical/coherent Level of Consciousness: Alert Mood: Pleasant Affect: Appropriate to circumstance Anxiety Level: None Thought Processes: Coherent Judgement: Unimpaired Orientation: Place, Person, Time, Situation Obsessive Compulsive Thoughts/Behaviors: None  Cognitive Functioning Concentration: Normal Memory: Recent Intact IQ: Average Insight: Fair Impulse Control: Fair Appetite: Fair Sleep: No Change Vegetative Symptoms: None  ADLScreening [facility] Assessment Services) Patient's cognitive ability adequate to safely complete daily activities?: Yes Patient able to express need for assistance with ADLs?: Yes Independently performs ADLs?: Yes (appropriate for developmental age)  Prior Inpatient Therapy Prior Inpatient Therapy: No Prior Therapy Dates: n/a Prior Therapy Facilty/Provider(s): n/a Reason for Treatment: n/a  Prior Outpatient Therapy Prior Outpatient Therapy: No Prior Therapy Dates: n/a Prior Therapy Facilty/Provider(s): n/a Reason for Treatment: n/a Does patient have an ACCT team?: No Does patient have Intensive In-House Services?  : No Does patient have [facility] services? : No Does patient have  P4CC services?: No  ADL Screening (condition at time of admission) Patient's cognitive ability adequate to safely complete daily activities?: Yes Patient able to express need for assistance with ADLs?: Yes Independently performs ADLs?: Yes (appropriate for developmental age)       Abuse/Neglect Assessment (Assessment to be complete while patient is alone) Physical Abuse: Denies Verbal Abuse: Denies Sexual Abuse: Denies Exploitation of patient/patient's resources: Denies Self-Neglect: Denies     Regulatory affairs officer (For Healthcare) Does Patient Have a Medical Advance Directive?: No    Additional Information 1:1 In Past 12 Months?: No CIRT Risk: No Elopement Risk: No Does patient have medical clearance?: Yes     Disposition:  Disposition Initial Assessment Completed for this Encounter: Yes Disposition of Patient: Other dispositions  On Site Evaluation by:   Reviewed with Physician:    Elmer Bales 01/02/2017 10:40 PM

## 2017-01-02 NOTE — ED Notes (Addendum)
Went to draw blood on this patient, after getting the green and lavender tube patient jerked back and needle came out. Talked to the patient for a while and told her we needed one more tube she agreed. When sticking the patient again she started to scream and swing her arms around. RN Anda Kraft made aware of. Unable to obtain specimen tube.

## 2017-01-02 NOTE — Discharge Instructions (Signed)
Please return as needed. Follow-up with your doctor.

## 2017-01-02 NOTE — ED Notes (Signed)
Pt not dressed out at this time, pt has blue puffy jacket at bedside, blue pajama top and bottom. Light blue robe. Glasses on face.    Pt states "I didn't want to come. They came into my apartment and told me they were gonna take me. 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 Pt does have a gun, states she has had it since the 60's. Its a 20 she states. She said she hasn't had her gun since the "weekend or so". At current denies SI, HI, and hallucinations.

## 2017-01-02 NOTE — ED Notes (Signed)
Pt let this RN butterfly stick pt to obtain red top. Pt pleasant at this time. Pt previously had talked about calling a cab wanting to leave.

## 2017-01-02 NOTE — Telephone Encounter (Signed)
Please contact Cecille Rubin and advise that they should contact EMS to transport the patient to the ED for evaluation for suicidal ideation with plan. This needs to be evaluated in the ED.

## 2017-01-02 NOTE — ED Notes (Signed)
Pt given [place] sandwich tray and drink. Pt sitting on side of bed with table to eat.

## 2017-01-02 NOTE — ED Provider Notes (Signed)
[facility] Emergency Department Provider Note   ____________________________________________   None    (approximate)  I have reviewed the triage vital signs and the nursing notes.   HISTORY  Chief Complaint Psychiatric Evaluation    HPI Mary Fields is a 76 y.o. female who comes from [facility] did living. Facility says that she made a comment to [place] was going to harm herself. Patient denies this. 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   Past Medical History:  Diagnosis Date  . 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 Atypical nevi   . Benign neoplasm of skin, site unspecified    Dr. Aubery Lapping  . Colitis   . 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 Disorder of bone and cartilage, unspecified   . Diverticulosis   . Dizziness and giddiness   . Dyspnea on exertion   . Erosive esophagitis   . Esophageal reflux   . Herpes simplex without mention of complication   . Hip fracture ([place]) 11/12   surgery  . Hx-TIA (transient ischemic attack)   . Malignant neoplasm of kidney, except pelvis   . 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 Other malaise and fatigue   . Palpitations   . Palpitations    a. 07/2015 Echo: EF 60-65%, Gr 1 DD, mild AI.  Marland Kitchen Personal history of malignant neoplasm of breast   . Personal history of other malignant neoplasm of skin   . Tobacco abuse   . Ulcerative colitis, unspecified   . Unspecified asthma(493.90)    since childhood  . Vaginal atrophy   . Vaginal stenosis     Patient Active Problem List   Diagnosis Date Noted  . Sinusitis 12/28/2016  . Suprapubic pain 07/25/2016  . 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 History of melanoma 01/20/2016  . Coagulation test abnormality  01/20/2016  . Hematoma 01/14/2016  . Right hip pain 12/28/2015  . Fall 12/28/2015  . Routine general medical examination at a health care facility 12/16/2015  . Memory loss 12/16/2015  . Muscle cramps at night 12/16/2015  . Dysarthria 12/16/2015  . Expressive aphasia 12/16/2015  . Bereavement 11/18/2015  . Hoarseness 09/22/2015  . Special screening for malignant neoplasms, colon 09/22/2015  . Dyspnea on exertion 07/21/2015  . History of renal cell cancer 09/07/2014  . Urinary incontinence 09/07/2014  . Contact dermatitis 04/08/2014  . Stopped smoking with greater than 25 pack year history 02/25/2014  . Irregular heart beat 01/30/2014  . Allergic rhinitis 01/30/2014  . Medicare annual wellness visit, subsequent 08/08/2012  . Screening for breast cancer 08/08/2012  . Atrophic vaginitis 08/08/2012  . GERD (gastroesophageal reflux disease) 05/07/2012  . History of cardiovascular disorder 04/30/2009  . COLITIS, ULCERATIVE 05/16/2007  . Personal history of malignant neoplasm of breast 05/16/2007    Past Surgical History:  Procedure Laterality Date  . ABDOMINAL HYSTERECTOMY  1986   endometriosis, Dr. Pearletha Furl  . APPENDECTOMY    . BREAST LUMPECTOMY Right 1995   right breast cancer, lumpectomy and XRT and chemo  . COLONOSCOPY  4/10   diverticulosis; recheck 5 years  . ESOPHAGOGASTRODUODENOSCOPY  2002   esophagitis; grade 1, erosive  . HIP FRACTURE SURGERY Right 2005   hemiarthroplasty right, Dr. Francia Greaves  .  HIP FRACTURE SURGERY Left 11/12   after fall at church  . MELANOMA EXCISION  1980   Dr. Sharlet Salina  . NEPHRECTOMY  2000   left renal cell carcinoma, Dr. Jacqlyn Larsen    Prior to Admission medications   Medication Sig Start Date End Date Taking? Authorizing Provider  acyclovir (ZOVIRAX) 800 MG tablet Take 1 tablet (800 mg total) by mouth 2 (two) times daily. 05/15/16  Yes Jackolyn Confer, MD  amoxicillin-clavulanate (AUGMENTIN) 875-125 MG tablet Take 1 tablet by mouth 2 (two) times  daily. 12/28/16  Yes Leone Haven, MD  aspirin 81 MG tablet Take 81 mg by mouth every other day.    Yes Historical Provider, MD  Cyanocobalamin (B-12) 500 MCG SUBL Take 500 mcg by mouth every other day.   Yes Historical Provider, MD  Dextromethorphan-Quinidine (NUEDEXTA) 20-10 MG CAPS Take 20 mg by mouth 2 (two) times daily. 07/19/16  Yes Donika Keith Rake, DO  diphenhydrAMINE (BENADRYL) 25 mg capsule Take 25 mg by mouth every 6 (six) hours as needed for itching.   Yes Historical Provider, MD  Multiple Vitamin (MULTIVITAMIN) tablet Take 1 tablet by mouth daily.     Yes Historical Provider, MD  NON FORMULARY Take 1 tablet by mouth daily. Ease Takes 1 tablet by mouth daily.    Yes Historical Provider, MD  pantoprazole (PROTONIX) 40 MG tablet Take 1 tablet (40 mg total) by mouth daily. 05/15/16  Yes Jackolyn Confer, MD  Propylene Glycol (SYSTANE BALANCE OP) 1 drop daily.    Yes Historical Provider, MD  ranitidine (ZANTAC) 300 MG tablet Take 1 tablet (300 mg total) by mouth at bedtime. 12/22/15  Yes Jackolyn Confer, MD  tizanidine (ZANAFLEX) 2 MG capsule Take 1 capsule (2 mg total) by mouth at bedtime. 10/02/16  Yes Donika Keith Rake, DO  vitamin C (ASCORBIC ACID) 500 MG tablet Take 1 tablet (500 mg total) by mouth daily. 10/05/15  Yes Rogelia Mire, NP  benzonatate (TESSALON) 200 MG capsule Take 1 capsule (200 mg total) by mouth 2 (two) times daily as needed for cough. Patient not taking: Reported on 01/02/2017 12/28/16   Leone Haven, MD    Allergies Prednisone; Sulfonamide derivatives; Meperidine; and Ciprofloxacin  Family History  Problem Relation Age of Onset  . Heart disease Paternal Grandmother   . Hypertension Paternal Grandmother   . Stomach cancer Maternal Grandmother   . Kidney disease Maternal Uncle   . Cancer Paternal Aunt     breast cancer    Social History Social History  Substance Use Topics  . Smoking status: Former Smoker    Quit date: 08/02/1984  . Smokeless  tobacco: Never Used  . Alcohol use 0.0 oz/week     Comment: 1 beer once a month    Review of Systems Constitutional: No fever/chills Eyes: No visual changes. ENT: No sore throat. Cardiovascular: Denies chest pain. Respiratory: Denies shortness of breath. Gastrointestinal: No abdominal pain.  No nausea, no vomiting.  No diarrhea.  No constipation. Genitourinary: Negative for dysuria. Musculoskeletal: Negative for back pain. Skin: Negative for rash.  10-point ROS otherwise negative.  ____________________________________________   PHYSICAL EXAM:  VITAL SIGNS: ED Triage Vitals [01/02/17 1805]  Enc Vitals Group     BP (!) 187/90     Pulse Rate 89     Resp 16     Temp 97.8 F (36.6 C)     Temp Source Axillary     SpO2 98 %     Weight  Height      Head Circumference      Peak Flow      Pain Score 0     Pain Loc      Pain Edu?      Excl. in [place]?     Constitutional: Alert and oriented. Well appearing and in no acute distress. Eyes: Conjunctivae are normal. PERRL. EOMI. Head: Atraumatic. Nose: No congestion/rhinnorhea. Mouth/Throat: Mucous membranes are moist.  Oropharynx non-erythematous. Neck: No stridor.  Cardiovascular: Normal rate, regular rhythm. Grossly normal heart sounds.  Good peripheral circulation. Respiratory: Normal respiratory effort.  No retractions. Lungs CTAB. Gastrointestinal: Soft and nontender. No distention. No abdominal bruits. No CVA tenderness. Musculoskeletal: No lower extremity tenderness nor edema.  No joint effusions.    ____________________________________________   LABS (all labs ordered are listed, but only abnormal results are displayed)  Labs Reviewed  COMPREHENSIVE METABOLIC PANEL - Abnormal; Notable for the following:       Result Value   Glucose, Bld 116 (*)    BUN 27 (*)    ALT 12 (*)    GFR calc non Af Amer 54 (*)    All other components within normal limits  CBC - Abnormal; Notable for the following:    RDW 16.9  (*)    All other components within normal limits  ACETAMINOPHEN LEVEL - Abnormal; Notable for the following:    Acetaminophen (Tylenol), Serum <10 (*)    All other components within normal limits  URINE DRUG SCREEN, QUALITATIVE ([facility] ONLY)  ETHANOL  SALICYLATE LEVEL   ____________________________________________  EKG   ____________________________________________  RADIOLOGY   ____________________________________________   PROCEDURES  Procedure(s) performed:  Procedures  Critical Care performed:   ____________________________________________   INITIAL IMPRESSION / ASSESSMENT AND PLAN / ED COURSE  Pertinent labs & imaging results that were available during my care of the patient were reviewed by me and considered in my medical decision making (see chart for details).  SOC sees  the patient does not feel she meets commitment criteria I must agree. 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    Nena Polio, MD 01/02/17 [phone]

## 2017-01-02 NOTE — Telephone Encounter (Signed)
Gaylyn Rong for more information previous message, She states the patient made the threat on Saturday. Patient confessed and handed gun over to security on Monday. Cecille Rubin states she is currently leaving another patients home and is heading to this patients home to call the ambulance. Cecille Rubin states the patient had promised her that she would not attempt to hurt herself and would likely refuse the ambulance. Informed Cecille Rubin that with suicidal ideation she will need to call the ambulance now to have the patient properly evaluated.Cecille Rubin states she will do so.

## 2017-01-02 NOTE — Telephone Encounter (Signed)
Please advise 

## 2017-01-02 NOTE — ED Notes (Signed)
Pt talking on her cell phone in room.

## 2017-01-02 NOTE — ED Triage Notes (Signed)
Pt to ED via EMS from [facility] asst living. Per facility pt made comment to friend that she was going to harm herself. PCP was notified and told for pt to be evaluated. Pt hx of ALS, A&Ox4. PT denies any SI/HI

## 2017-01-03 NOTE — Telephone Encounter (Signed)
fyi

## 2017-01-03 NOTE — Telephone Encounter (Signed)
Lori from [facility] called and stated that pt went to be evaulated yesterday and returned at 1:30 am and is feeling ok.

## 2017-01-09 DIAGNOSIS — G1221 Amyotrophic lateral sclerosis: Secondary | ICD-10-CM | POA: Diagnosis not present

## 2017-01-09 DIAGNOSIS — R262 Difficulty in walking, not elsewhere classified: Secondary | ICD-10-CM | POA: Diagnosis not present

## 2017-01-09 DIAGNOSIS — R471 Dysarthria and anarthria: Secondary | ICD-10-CM | POA: Diagnosis not present

## 2017-01-09 DIAGNOSIS — R1312 Dysphagia, oropharyngeal phase: Secondary | ICD-10-CM | POA: Diagnosis not present

## 2017-01-09 DIAGNOSIS — M6281 Muscle weakness (generalized): Secondary | ICD-10-CM | POA: Diagnosis not present

## 2017-01-31 DIAGNOSIS — G1221 Amyotrophic lateral sclerosis: Secondary | ICD-10-CM | POA: Diagnosis not present

## 2017-02-05 ENCOUNTER — Encounter: Payer: Self-pay | Admitting: *Deleted

## 2017-02-05 ENCOUNTER — Emergency Department: Payer: PPO

## 2017-02-05 ENCOUNTER — Emergency Department
Admission: EM | Admit: 2017-02-05 | Discharge: 2017-02-05 | Disposition: A | Discharge status: Home / Self Care | Payer: PPO | Attending: Student in an Organized Health Care Education/Training Program | Admitting: Student in an Organized Health Care Education/Training Program

## 2017-02-05 DIAGNOSIS — W0110XA Fall on same level from slipping, tripping and stumbling with subsequent striking against unspecified object, initial encounter: Secondary | ICD-10-CM | POA: Insufficient documentation

## 2017-02-05 DIAGNOSIS — M25551 Pain in right hip: Secondary | ICD-10-CM | POA: Insufficient documentation

## 2017-02-05 DIAGNOSIS — Z87891 Personal history of nicotine dependence: Secondary | ICD-10-CM | POA: Insufficient documentation

## 2017-02-05 DIAGNOSIS — Y939 Activity, unspecified: Secondary | ICD-10-CM | POA: Insufficient documentation

## 2017-02-05 DIAGNOSIS — S199XXA Unspecified injury of neck, initial encounter: Secondary | ICD-10-CM | POA: Diagnosis not present

## 2017-02-05 DIAGNOSIS — S0990XA Unspecified injury of head, initial encounter: Secondary | ICD-10-CM | POA: Diagnosis not present

## 2017-02-05 DIAGNOSIS — S0003XA Contusion of scalp, initial encounter: Secondary | ICD-10-CM | POA: Insufficient documentation

## 2017-02-05 DIAGNOSIS — Y92030 Kitchen in apartment as the place of occurrence of the external cause: Secondary | ICD-10-CM | POA: Diagnosis not present

## 2017-02-05 DIAGNOSIS — W19XXXA Unspecified fall, initial encounter: Secondary | ICD-10-CM

## 2017-02-05 DIAGNOSIS — S59911A Unspecified injury of right forearm, initial encounter: Secondary | ICD-10-CM | POA: Diagnosis not present

## 2017-02-05 DIAGNOSIS — Y999 Unspecified external cause status: Secondary | ICD-10-CM | POA: Diagnosis not present

## 2017-02-05 DIAGNOSIS — S79911A Unspecified injury of right hip, initial encounter: Secondary | ICD-10-CM | POA: Diagnosis not present

## 2017-02-05 NOTE — Discharge Instructions (Signed)
Please follow up with PCP.  Return for worsening pain.

## 2017-02-05 NOTE — ED Notes (Signed)
Only xray ordered at this time per Dr. Corky Downs

## 2017-02-05 NOTE — ED Triage Notes (Signed)
States she has slippery socks on and slipped and fell in the kitchen this AM, now complains of left hip/ buttocks pain, states she hit the back of her head, on 81 mg ASA, hx of ALS

## 2017-02-05 NOTE — ED Provider Notes (Signed)
[facility] Emergency Department Provider Note    None    (approximate)  I have reviewed the triage vital signs and the nursing notes.   HISTORY  Chief Complaint Fall    HPI Mary Fields is a 76 y.o. female 3 of ALS presents for evaluation of left hip and buttocks pain as well as pain to the back of her head after she slipped and fell in her kitchen this morning. She was wearing slippery socks and lost her balance. She denies any dizziness, chest pain or shortness of breath prior to the fall. Take a baby aspirin daily. States that the pain is improving but wanted to be checked out to make sure that nothing was broken or injured.  He rates the pain is minimal in severity at this time.   Past Medical History:  Diagnosis Date  . 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 Atypical nevi   . Benign neoplasm of skin, site unspecified    Dr. Aubery Lapping  . Colitis   . 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 Disorder of bone and cartilage, unspecified   . Diverticulosis   . Dizziness and giddiness   . Dyspnea on exertion   . Erosive esophagitis   . Esophageal reflux   . Herpes simplex without mention of complication   . Hip fracture ([place]) 11/12   surgery  . Hx-TIA (transient ischemic attack)   . Malignant neoplasm of kidney, except pelvis   . 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 Other malaise and fatigue   . Palpitations   . Palpitations    a. 07/2015 Echo: EF 60-65%, Gr 1 DD, mild AI.  Marland Kitchen Personal history of malignant neoplasm of breast   . Personal history of other malignant neoplasm of skin   . Tobacco abuse   . Ulcerative colitis, unspecified   . Unspecified asthma(493.90)    since childhood  . Vaginal atrophy   . Vaginal stenosis    Family History  Problem Relation Age of Onset  . Heart disease Paternal Grandmother   . Hypertension Paternal Grandmother   .  Stomach cancer Maternal Grandmother   . Kidney disease Maternal Uncle   . Cancer Paternal Aunt     breast cancer   Past Surgical History:  Procedure Laterality Date  . ABDOMINAL HYSTERECTOMY  1986   endometriosis, Dr. Pearletha Furl  . APPENDECTOMY    . BREAST LUMPECTOMY Right 1995   right breast cancer, lumpectomy and XRT and chemo  . COLONOSCOPY  4/10   diverticulosis; recheck 5 years  . ESOPHAGOGASTRODUODENOSCOPY  2002   esophagitis; grade 1, erosive  . HIP FRACTURE SURGERY Right 2005   hemiarthroplasty right, Dr. Francia Greaves  . HIP FRACTURE SURGERY Left 11/12   after fall at church  . MELANOMA EXCISION  1980   Dr. Sharlet Salina  . NEPHRECTOMY  2000   left renal cell carcinoma, Dr. Jacqlyn Larsen   Patient Active Problem List   Diagnosis Date Noted  . Sinusitis 12/28/2016  . Suprapubic pain 07/25/2016  . 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 History of melanoma 01/20/2016  . Coagulation test abnormality 01/20/2016  . Hematoma 01/14/2016  . Right hip pain 12/28/2015  . Fall 12/28/2015  . Routine general medical examination at a health care facility 12/16/2015  . Memory loss 12/16/2015  . Muscle cramps at night 12/16/2015  .  Dysarthria 12/16/2015  . Expressive aphasia 12/16/2015  . Bereavement 11/18/2015  . Hoarseness 09/22/2015  . Special screening for malignant neoplasms, colon 09/22/2015  . Dyspnea on exertion 07/21/2015  . History of renal cell cancer 09/07/2014  . Urinary incontinence 09/07/2014  . Contact dermatitis 04/08/2014  . Stopped smoking with greater than 25 pack year history 02/25/2014  . Irregular heart beat 01/30/2014  . Allergic rhinitis 01/30/2014  . Medicare annual wellness visit, subsequent 08/08/2012  . Screening for breast cancer 08/08/2012  . Atrophic vaginitis 08/08/2012  . GERD (gastroesophageal reflux disease) 05/07/2012  . History of cardiovascular disorder 04/30/2009  . COLITIS, ULCERATIVE 05/16/2007   . Personal history of malignant neoplasm of breast 05/16/2007      Prior to Admission medications   Medication Sig Start Date End Date Taking? Authorizing Provider  acyclovir (ZOVIRAX) 800 MG tablet Take 1 tablet (800 mg total) by mouth 2 (two) times daily. 05/15/16   Jackolyn Confer, MD  amoxicillin-clavulanate (AUGMENTIN) 875-125 MG tablet Take 1 tablet by mouth 2 (two) times daily. 12/28/16   Leone Haven, MD  aspirin 81 MG tablet Take 81 mg by mouth every other day.     Historical Provider, MD  benzonatate (TESSALON) 200 MG capsule Take 1 capsule (200 mg total) by mouth 2 (two) times daily as needed for cough. Patient not taking: Reported on 01/02/2017 12/28/16   Leone Haven, MD  Cyanocobalamin (B-12) 500 MCG SUBL Take 500 mcg by mouth every other day.    Historical Provider, MD  Dextromethorphan-Quinidine (NUEDEXTA) 20-10 MG CAPS Take 20 mg by mouth 2 (two) times daily. 07/19/16   Donika Keith Rake, DO  diphenhydrAMINE (BENADRYL) 25 mg capsule Take 25 mg by mouth every 6 (six) hours as needed for itching.    Historical Provider, MD  Multiple Vitamin (MULTIVITAMIN) tablet Take 1 tablet by mouth daily.      Historical Provider, MD  NON FORMULARY Take 1 tablet by mouth daily. Ease Takes 1 tablet by mouth daily.     Historical Provider, MD  pantoprazole (PROTONIX) 40 MG tablet Take 1 tablet (40 mg total) by mouth daily. 05/15/16   Jackolyn Confer, MD  Propylene Glycol (SYSTANE BALANCE OP) 1 drop daily.     Historical Provider, MD  ranitidine (ZANTAC) 300 MG tablet Take 1 tablet (300 mg total) by mouth at bedtime. 12/22/15   Jackolyn Confer, MD  tizanidine (ZANAFLEX) 2 MG capsule Take 1 capsule (2 mg total) by mouth at bedtime. 10/02/16   Donika Keith Rake, DO  vitamin C (ASCORBIC ACID) 500 MG tablet Take 1 tablet (500 mg total) by mouth daily. 10/05/15   Rogelia Mire, NP    Allergies Prednisone; Sulfonamide derivatives; Meperidine; and Ciprofloxacin    Social History Social  History  Substance Use Topics  . Smoking status: Former Smoker    Quit date: 08/02/1984  . Smokeless tobacco: Never Used  . Alcohol use 0.0 oz/week     Comment: 1 beer once a month    Review of Systems Patient denies headaches, rhinorrhea, blurry vision, numbness, shortness of breath, chest pain, edema, cough, abdominal pain, nausea, vomiting, diarrhea, dysuria, fevers, rashes or hallucinations unless otherwise stated above in HPI. ____________________________________________   PHYSICAL EXAM:  VITAL SIGNS: Vitals:   02/05/17 1027 02/05/17 1321  BP: (!) 152/81 (!) 157/61  Pulse: 83 66  Resp: 18 18  Temp: 98.6 F (37 C)     Constitutional: Alert and oriented. in no acute distress. Eyes:  Conjunctivae are normal. PERRL. EOMI. Head:soft tissue contusion and hematoma to posterior scalp Nose: No congestion/rhinnorhea. Mouth/Throat: Mucous membranes are moist.  Oropharynx non-erythematous. Neck: No stridor. Painless ROM. Bilateral cervical spine tenderness to palpation Hematological/Lymphatic/Immunilogical: No cervical lymphadenopathy. Cardiovascular: Normal rate, regular rhythm. Grossly normal heart sounds.  Good peripheral circulation. Respiratory: Normal respiratory effort.  No retractions. Lungs CTAB. Gastrointestinal: Soft and nontender. No distention. No abdominal bruits. No CVA tenderness. Musculoskeletal: No lower extremity tenderness nor edema.  No joint effusions. Neurologic:  Slowed, slurred speech and language. No gross focal neurologic deficits are appreciated Skin:  Skin is warm, dry and intact. No rash noted. Psychiatric: Mood and affect are normal. Speech and behavior are normal.  ____________________________________________   LABS (all labs ordered are listed, but only abnormal results are displayed)  No results found for this or any previous visit (from the past 24  hour(s)). ____________________________________________  ____________________________________________  CLEXNTZGY  I personally reviewed all radiographic images ordered to evaluate for the above acute complaints and reviewed radiology reports and findings.  These findings were personally discussed with the patient.  Please see medical record for radiology report.  ____________________________________________   PROCEDURES  Procedure(s) performed:  Procedures    Critical Care performed: no ____________________________________________   INITIAL IMPRESSION / ASSESSMENT AND PLAN / ED COURSE  Pertinent labs & imaging results that were available during my care of the patient were reviewed by me and considered in my medical decision making (see chart for details).  DDX: fracture, contusion, sprain  Mary Fields is a 76 y.o. who presents to the ED with history of ALS who presents with fall from standing with above injuries.  Patient is AFVSS in ED. Exam as above. Given current presentation have considered the above differential. CT radiographic imaging ordered to evaluate for acute traumatic injury. Imaging shows no acute abnormality. Patient otherwise stable and requesting discharge home.  Have discussed with the patient and available family all diagnostics and treatments performed thus far and all questions were answered to the best of my ability. The patient demonstrates understanding and agreement with plan.       ____________________________________________   FINAL CLINICAL IMPRESSION(S) / ED DIAGNOSES  Final diagnoses:  Fall, initial encounter  Contusion of scalp, initial encounter  Pain of right hip joint      NEW MEDICATIONS STARTED DURING THIS VISIT:  Discharge Medication List as of 02/05/2017  1:17 PM       Note:  This document was prepared using Dragon voice recognition software and may include unintentional dictation errors.    Merlyn Lot,  MD 02/05/17 1440

## 2017-02-07 ENCOUNTER — Telehealth: Payer: Self-pay | Admitting: Family Medicine

## 2017-02-07 DIAGNOSIS — G1221 Amyotrophic lateral sclerosis: Secondary | ICD-10-CM

## 2017-02-07 NOTE — Telephone Encounter (Signed)
Hospice referral has been placed.  

## 2017-02-07 NOTE — Telephone Encounter (Signed)
Levada Dy from [facility] requesting a referral for pt. She is requesting her to be referred to hospice home health. Please advise.

## 2017-02-09 ENCOUNTER — Telehealth: Payer: Self-pay | Admitting: *Deleted

## 2017-02-09 NOTE — Telephone Encounter (Signed)
Informed her that she has seen several ones [facility] but mainly Dr.Patel

## 2017-02-09 NOTE — Telephone Encounter (Signed)
[name] from [facility] has requested any notes from pt's Neurologist or a name of her neurologist. [name] [phone]

## 2017-02-14 ENCOUNTER — Other Ambulatory Visit: Payer: Self-pay

## 2017-02-14 NOTE — Telephone Encounter (Signed)
Last OV 01/01/17 last filled by Dr.Walker 05/15/16 10 3rf

## 2017-02-14 NOTE — Telephone Encounter (Signed)
Please find out what the patient takes this for. Thanks. 

## 2017-02-19 MED ORDER — ACYCLOVIR 800 MG PO TABS: 800.0000 mg | ORAL_TABLET | Freq: Two times a day (BID) | ORAL | 3 refills | Status: AC

## 2017-02-19 NOTE — Telephone Encounter (Signed)
Noted. Sent to pharmacy.  

## 2017-02-19 NOTE — Telephone Encounter (Signed)
Left message to return call 

## 2017-02-19 NOTE — Telephone Encounter (Signed)
Patient states she takes this for herpes outbreak as needed

## 2017-02-19 NOTE — Telephone Encounter (Signed)
Pt requested a call at [phone]

## 2017-03-02 DIAGNOSIS — G1221 Amyotrophic lateral sclerosis: Secondary | ICD-10-CM | POA: Diagnosis not present

## 2017-03-13 DIAGNOSIS — R471 Dysarthria and anarthria: Secondary | ICD-10-CM | POA: Diagnosis not present

## 2017-03-13 DIAGNOSIS — G1221 Amyotrophic lateral sclerosis: Secondary | ICD-10-CM | POA: Diagnosis not present

## 2017-03-23 ENCOUNTER — Ambulatory Visit (INDEPENDENT_AMBULATORY_CARE_PROVIDER_SITE_OTHER): Payer: PPO | Admitting: Neurology

## 2017-03-23 ENCOUNTER — Encounter: Payer: Self-pay | Admitting: Neurology

## 2017-03-23 VITALS — BP 140/84 | HR 85 | Ht 66.0 in | Wt 131.6 lb

## 2017-03-23 DIAGNOSIS — G1221 Amyotrophic lateral sclerosis: Secondary | ICD-10-CM | POA: Diagnosis not present

## 2017-03-23 NOTE — Patient Instructions (Addendum)
It was great to see you today  Continue your current medications as you are taking them  Return to clinic in 5 months

## 2017-03-23 NOTE — Progress Notes (Signed)
Follow-up Visit   Date: 03/23/17   Mary Fields MRN: [id number] DOB: [birth year]   Interim History: Mary Fields is a 76 y.o. right-handed Caucasian female with GERD and history of melanoma, breast cancer and kidney cancer returning to the clinic for follow-up of amyotrophic lateral sclerosis.  The patient was accompanied to the clinic by friend who also provides collateral information.    History of present illness: Starting around early 2017, she began noticing speech changes and she was unable to sing. She was singing in a choir for 45+ years and then suddenly was unable to vocalize higher pitches.  Her voice has become deeper and slower, making speech production harder.  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  She also complains of new shortness of breath over the same time.  She is seeing Dr. Jamal Collin, pulmonologist.  She went to see ENT whose evaluation was normal and then referred to my collegue, Dr. Carles Collet.  She underwent extensive neurological testing including MRI brain, MRI cervical spine, NCS/EMG and MBS.  Her MRI did not show any structural disease or compressive myelopathy to explain symptoms.  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  She denies having problem with swallowing, but she has MBS which showed mild oropharyngeal dysphasia and recommended to take pulls with puree.  Since doing this, her pills are a lot easier to swallow.  She also has painful cramps of the hands and calf.  She has tried tizanidine 2mg  at bedtime, but has not noticed a significant change.  She has some twiches over the legs.   She has difficulty with grip, such as hanging her pants and trying to manipulate the clip.  She has a jar opener which  helps with opening bottles and jars.  She has no weakness of the legs and is able to climb 4 flights of stairs without getting tired.   There are spells of emotional lability especially when she sees commercials or hears beautiful classical music.  At church, she cries very easily at the music and it causes her social embarrassment.   UPDATE 07/19/2016:  Patient presents for sooner appointment to discuss questions regarding her diagnosis of ALS, prognosis, and medications.  She has noticed significant benefit with Nuedexta, but is not complaint with taking it daily only 1 tablet as needed; in fact, she was able to go to church and did not cry.  She would like to start Rilutek and had many questions about this medication.  She is worried about the progression of her disease and states that she would like to stay in her home for as long as possible.  I reassured her that we can arrange for help at home, when the need comes.  She has read about research with ALS and stem cell transplant and was inquiring whether we do it here, which we do not.  Referral to [facility] was offered.  UPDATE 09/20/2016:  Since her last visit, she went to [facility] and is being followed there. She endorses slight worsening of dyphagia and shortness of breath with exertions.  She is using chin tuck positioning, which helps. She is living independently and does not have any problems with day-to-day activities.  She  walks unassisted.  Her FVC was 43% and recommended to start using non-invasive ventilation, but she has not received any further communication regarding this.  She complains of increased sleepiness when she increased her tizanidine to 2mg  twice daily.  Her muscle cramps are worse at night time and she would like to reduce it back to 2mg  at bedtime.  She denies any new weakness of the arms or legs.  She was doing well on Neudexta, but it is too expensive so stopped this.  UPDATE 03/23/2017:  She is has been going to  [facility] and last saw Dr. Hosie Poisson on 03/13/2017.  Over the past 6 months, there continues to be gradual deterioration of her leg and strength. Speech and swallowing are her major problems and she often feels strained in her voice when trying to talk.  She has ordered a Visual merchandiser, which should help.  She has suffered two falls and had one hospitalization for suicidal ideation and was not deemed to be a risk to herself.  She does not wish to have aggressive life prolonging measures and has contemplated moving to a state where physician assisted suicide is legal, but states that her mood is much better now and has enrolled in [employer] since last month. She is still driving and able to do all her own IADLs and IADLs.  For her drooling, she was given atrophine drops but has not started this.  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  She did not tolerate Trilogy unit for her dyspnea, so is sleeping on a wedge.    Medications:  Current Outpatient Prescriptions on File Prior to Visit  Medication Sig Dispense Refill  . acyclovir (ZOVIRAX) 800 MG tablet Take 1 tablet (800 mg total) by mouth 2 (two) times daily. 10 tablet 3  . aspirin 81 MG tablet Take 81 mg by mouth every other day.     . Cyanocobalamin (B-12) 500 MCG SUBL Take 500 mcg by mouth every other day.    Marland Kitchen Dextromethorphan-Quinidine (NUEDEXTA) 20-10 MG CAPS Take 20 mg by mouth 2 (two) times daily. 60 capsule 11  . diphenhydrAMINE (BENADRYL) 25 mg capsule Take 25 mg by mouth every 6 (six) hours as needed for itching.    . Multiple Vitamin (MULTIVITAMIN) tablet Take 1 tablet by mouth daily.      . NON FORMULARY Take 1 tablet by mouth daily. Ease Takes 1 tablet by mouth daily.     . pantoprazole (PROTONIX) 40 MG tablet Take 1 tablet (40 mg total) by mouth daily. 90 tablet 3  . Propylene Glycol (SYSTANE BALANCE OP) 1 drop daily.     . ranitidine (ZANTAC) 300 MG tablet Take 1 tablet  (300 mg total) by mouth at bedtime. 30 tablet 6  . tizanidine (ZANAFLEX) 2 MG capsule Take 1 capsule (2 mg total) by mouth at bedtime. 30 capsule 2  . vitamin C (ASCORBIC ACID) 500 MG tablet Take 1 tablet (500 mg total) by mouth daily. 30 tablet 0   No current facility-administered medications on file prior to visit.     Allergies:  Allergies  Allergen Reactions  . Prednisone Hives  . Sulfonamide Derivatives Other (See Comments)    Skin whelps  . Meperidine Other (See Comments)    Unspecified  . Ciprofloxacin Diarrhea    REACTION: diarrhea    Review of Systems:  CONSTITUTIONAL: No fevers, chills, night sweats, or weight loss.  EYES: No visual  changes or eye pain ENT: No hearing changes.  No history of nose bleeds.   RESPIRATORY: No cough, wheezing and shortness of breath.   CARDIOVASCULAR: Negative for chest pain, and palpitations.   GI: Negative for abdominal discomfort, blood in stools or black stools.  No recent change in bowel habits.   GU:  No history of incontinence.   MUSCLOSKELETAL: No history of joint pain or swelling.  No myalgias.   SKIN: Negative for lesions, rash, and itching.   ENDOCRINE: Negative for cold or heat intolerance, polydipsia or goiter.   PSYCH:  No depression or anxiety symptoms.   NEURO: As Above.   Vital Signs:  BP 140/84   Pulse 85   Ht 5\' 6"  (1.676 m)   Wt 131 lb 9 oz (59.7 kg)   SpO2 90%   BMI 21.23 kg/m    Neurological Exam: MENTAL STATUS including orientation to time, place, person, recent and remote memory, attention span and concentration, language, and fund of knowledge is normal.  Speech is moderately dysarthric with spastic nature (25% intelligible speech).  CRANIAL NERVES:  Pupils equal round and reactive to light. Face is symmetric, fasciculations present over the mentalis and orbicularis oris muscles. Palate elevates symmetrically.  Tongue is midline and there are active fasciculations. Tongue movements are very slow and she has  difficulty tucking the tongue into each cheek.   MOTOR: There is moderate atrophy of the intrinsic hand muscles.  Active fasciculations are seen over the biceps, triceps, forearm muscles.    Tone is normal.  Motor weakness has progressed with neck flexion 4/5, bilateral upper extremities 4/5, including finger abduction; 4/5 bilateral hip flexion, knee flexion, and dorsiflexion.  Knee extension is 5/5  COORDINATION/GAIT: Unable to rise from a chair without using arms.  Gait narrow based and stable.   Data: MRI cervical spine wo contrast 06/13/2016:  No finding to explain the patient's symptoms. The cervical cord appears normal. Mild multilevel spondylosis without central canal narrowing is identified. There is mild to moderate foraminal narrowing at C5-6, worse on the left.  MRI brain wwo contrast 02/16/2016:  No acute or reversible finding. Mild generalized brain atrophy with mild to moderate chronic small-vessel ischemic changes of the cerebral hemispheric white matter, slightly progressive since 2007.  MBS 05/18/2016:  Mild oral phase dysphasia and reduced lingual coordination and control of liquid boluses  NCS/EMG of the right side 05/02/2016:  In summary, the above findings are consistent with active on chronic cervical intraspinal lesions affecting the right C8-T1 roots/segments, which is mildin degree electrically. 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 Clinical correlation recommended.   IMPRESSION: Mary Fields is a 76 year-old female returning with bulbar onset ALS.  She is also being followed by [facility] whose support is appreciated. Clinically, there has been progressive weakness of the arms and legs, as well as bulbar muscles as expected with this disease course.  Her bulbar weakness is  affecting her voice and swallowing.  She seems to be doing well with chin-tuck positioning.  I recommended adding a thickner to liquids and maintain soft food diet. She does not wish to have PEG/tracheostomy.  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   Continue tizanidine 2mg  for cramps and Neudexta for PBA, which controls these symptoms Recommend that she start atropine drops as  prescribed for drooling Continue riluzole 50mg  twice daily.  Appreciate hospice care which is very appropriate given her bulbar weakness and natural course of this disease  Return to clinic in 5 months  The duration of this appointment visit was 30 minutes of face-to-face time with the patient.  Greater than 50% of this time was spent in counseling, explanation of diagnosis, planning of further management, and coordination of care.   Thank you for allowing me to participate in patient's care.  If I can answer any additional questions, I would be pleased to do so.    Sincerely,    Aivah Putman K. Posey Pronto, DO

## 2017-03-28 DIAGNOSIS — G1221 Amyotrophic lateral sclerosis: Secondary | ICD-10-CM | POA: Diagnosis not present

## 2017-03-28 DIAGNOSIS — M216X2 Other acquired deformities of left foot: Secondary | ICD-10-CM | POA: Diagnosis not present

## 2017-04-02 ENCOUNTER — Telehealth: Payer: Self-pay | Admitting: Family Medicine

## 2017-04-02 NOTE — Telephone Encounter (Signed)
See below message

## 2017-04-02 NOTE — Telephone Encounter (Signed)
You could offer an appointment with me or Dr. Lacinda Axon (depending on availability) tomorrow for evaluation if she is not wanting to be evaluated today.

## 2017-04-02 NOTE — Telephone Encounter (Signed)
Pt called back returning your call. Please advise, thank you!  Call pt @ [phone]

## 2017-04-02 NOTE — Telephone Encounter (Signed)
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 Pt did not go to the ED bu pt has been nauseas, loss appetite and more difficulty breathing. Pt would like to have a chest xray done. Please advise, thank you!  Call pt @ [phone]

## 2017-04-02 NOTE — Telephone Encounter (Signed)
Advised patient of below and she declined to go to urgent care.  Tried to explained importance of evaluation.  She stated she wasn't going back out today.

## 2017-04-02 NOTE — Telephone Encounter (Signed)
Patient needs to be evaluated. If she is having trouble breathing I would advise evaluation today.

## 2017-04-03 NOTE — Telephone Encounter (Signed)
Spoke with patient and she declined to schedule appointment she states she is feeling better.  She states Hospice RN was out to see her today and she stated everything was ok.

## 2017-04-03 NOTE — Telephone Encounter (Signed)
Noted  

## 2017-04-11 ENCOUNTER — Telehealth: Payer: Self-pay | Admitting: *Deleted

## 2017-04-11 NOTE — Telephone Encounter (Signed)
Rhonda from Hospice called back and stated that pt is receiving hospice through [facility]. She stated that you can disregard the last note unless pt is looking to transfer care. Please advise, thank you!

## 2017-04-11 NOTE — Telephone Encounter (Signed)
Noted. Records should come from [place].

## 2017-04-11 NOTE — Telephone Encounter (Signed)
fyi

## 2017-04-11 NOTE — Telephone Encounter (Signed)
[place] [facility]osp[facility] has has requested office notes to support reasons for hospice. A certification of terminal illness has been faxed to this office and will need to be signed by provider and returned  Fax [phone]

## 2017-04-30 ENCOUNTER — Telehealth: Payer: Self-pay | Admitting: Family Medicine

## 2017-04-30 NOTE — Telephone Encounter (Signed)
Called pt to schedule awv. Lvm for pt to call office [phone]) to schedule appt.

## 2017-05-02 ENCOUNTER — Telehealth: Payer: Self-pay | Admitting: Family Medicine

## 2017-05-02 NOTE — Telephone Encounter (Signed)
Pt returned call regarding AWV. Pt declined AWV at this time. SF

## 2017-05-04 DIAGNOSIS — H2513 Age-related nuclear cataract, bilateral: Secondary | ICD-10-CM | POA: Diagnosis not present

## 2017-05-31 ENCOUNTER — Other Ambulatory Visit: Payer: Self-pay | Admitting: Neurology

## 2017-06-01 ENCOUNTER — Other Ambulatory Visit: Payer: Self-pay | Admitting: *Deleted

## 2017-06-01 MED ORDER — TIZANIDINE HCL 2 MG PO CAPS: 2.0000 mg | ORAL_CAPSULE | Freq: Every day | ORAL | 2 refills | Status: AC

## 2017-06-12 DIAGNOSIS — G1221 Amyotrophic lateral sclerosis: Secondary | ICD-10-CM | POA: Diagnosis not present

## 2017-06-12 DIAGNOSIS — R3915 Urgency of urination: Secondary | ICD-10-CM | POA: Diagnosis not present

## 2017-06-14 ENCOUNTER — Telehealth: Payer: Self-pay | Admitting: Neurology

## 2017-06-14 NOTE — Telephone Encounter (Signed)
Patient needs a letter letting her know when she was DX with ALS. She would like it mailed to her  Very hard to understand over phone so please mail it to her

## 2017-06-15 ENCOUNTER — Encounter: Payer: Self-pay | Admitting: *Deleted

## 2017-06-15 NOTE — Telephone Encounter (Signed)
What date should I use for diagnosis?

## 2017-06-15 NOTE — Telephone Encounter (Signed)
Letter mailed

## 2017-06-15 NOTE — Telephone Encounter (Signed)
I gave her the discuss of ALS on July 05, 2016.  Please encourage her to use MyChart messaging, if it is difficult to communicate over the phone.  Thanks.

## 2017-06-18 DIAGNOSIS — M79672 Pain in left foot: Secondary | ICD-10-CM | POA: Diagnosis not present

## 2017-06-18 DIAGNOSIS — M216X2 Other acquired deformities of left foot: Secondary | ICD-10-CM | POA: Diagnosis not present

## 2017-07-08 IMAGING — US US EXTREM LOW*R* LIMITED
1 series · 14 of 25 positions shown · non-contrast
Comparison: Hip series [DATE].

CLINICAL DATA: Hematoma.

EXAM:
ULTRASOUND RIGHT LOWER EXTREMITY LIMITED
TECHNIQUE: Ultrasound examination of the lower extremity soft tissues was
performed in the area of clinical concern.

[Series 1: us extrem low*right* limited · 0.12mm/px · 14 of 29 slices shown]
[im 1/29]
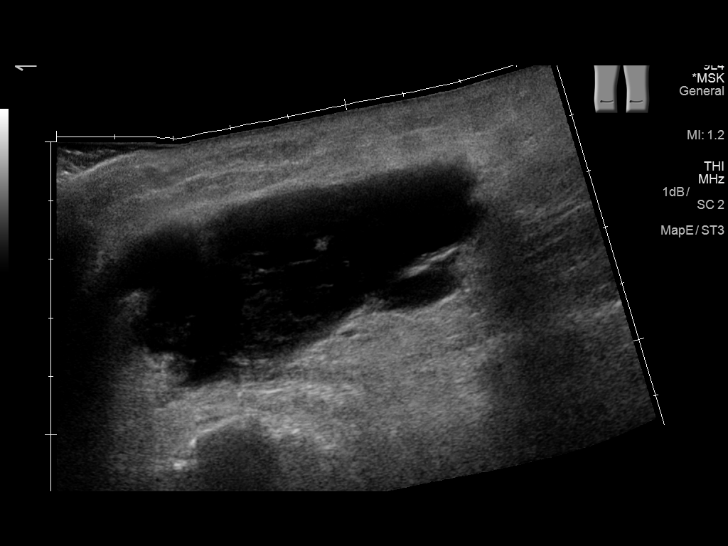
[im 3/29]
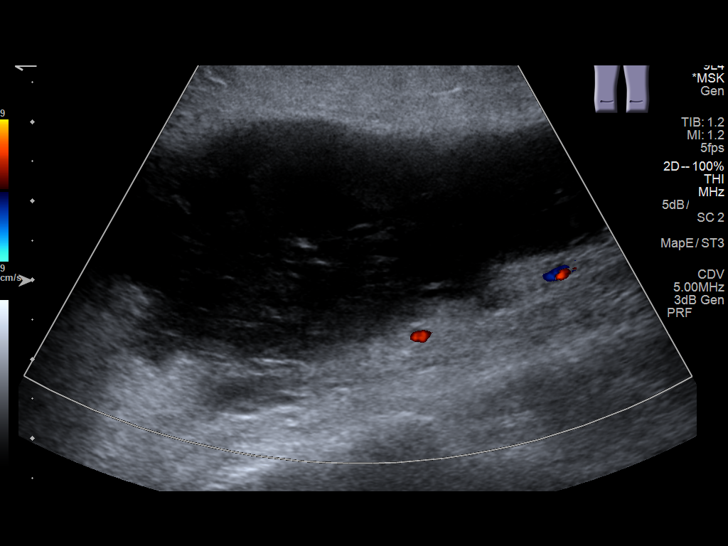
[im 5/29]
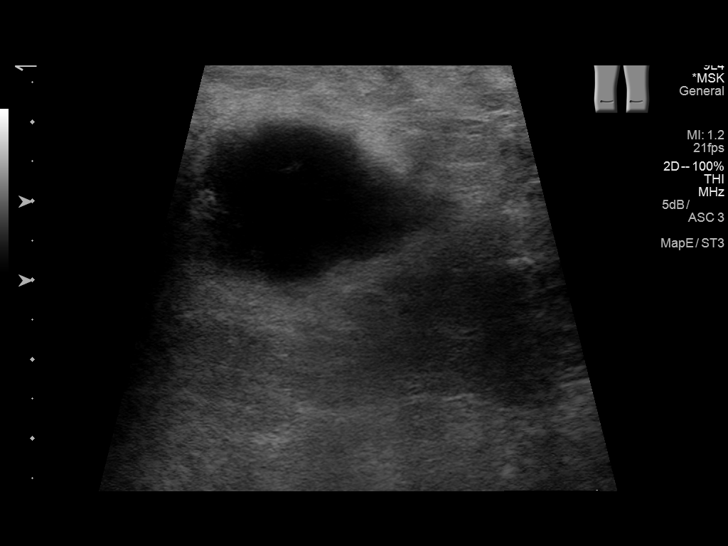
[im 8/29]
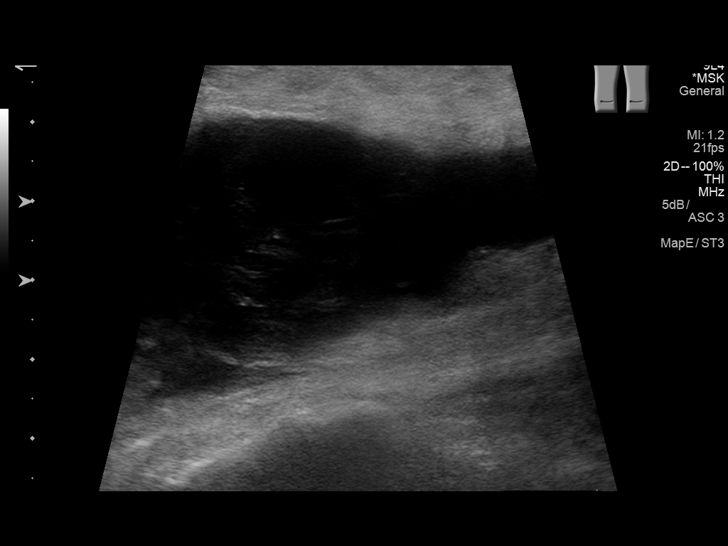
[im 10/29]
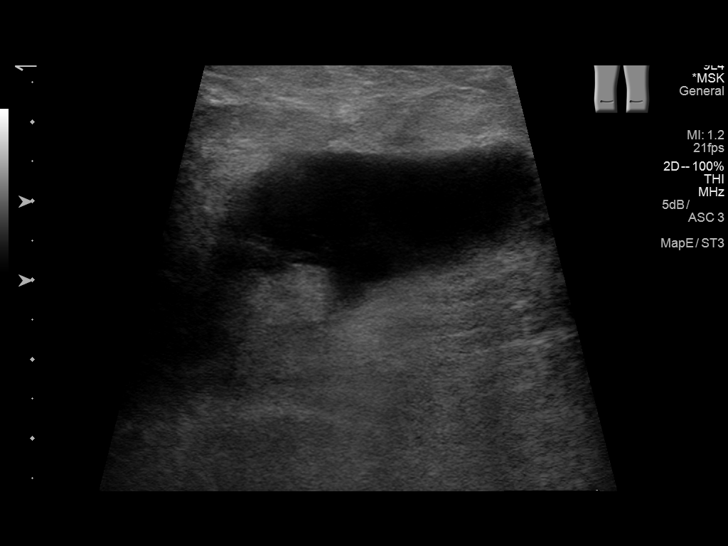
[im 11/29]
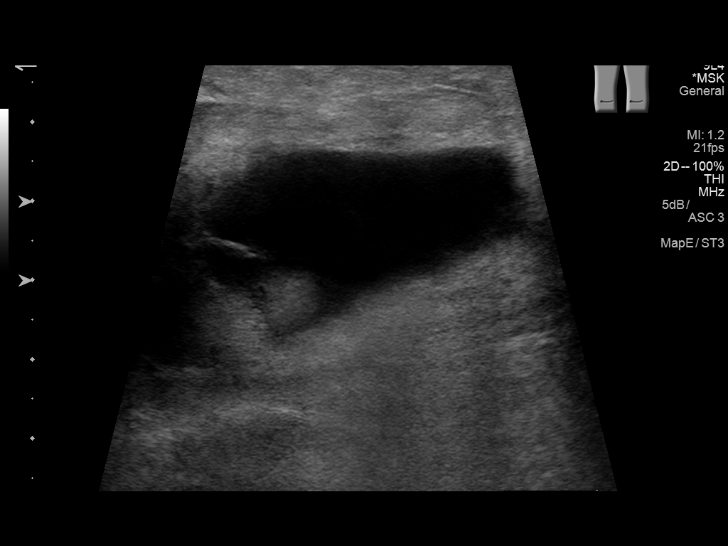
[im 13/29]
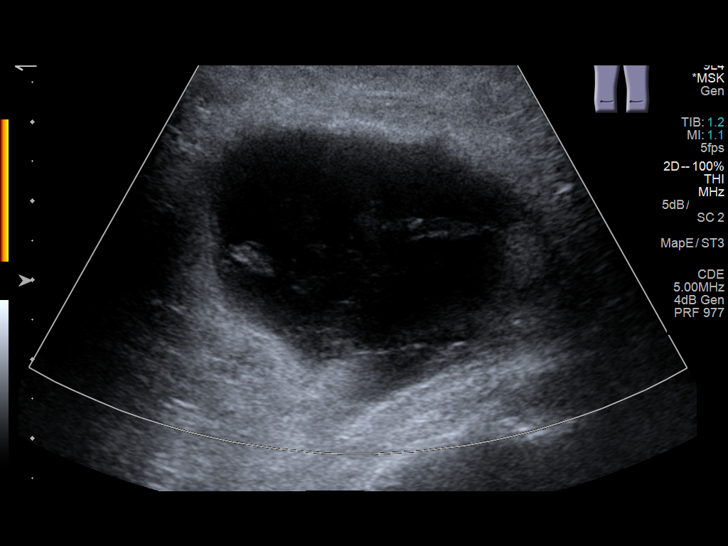
[im 16/29]
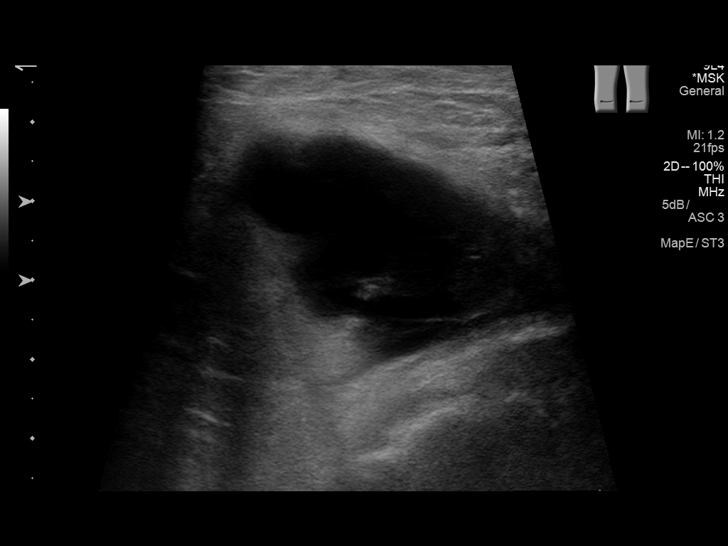
[im 18/29]
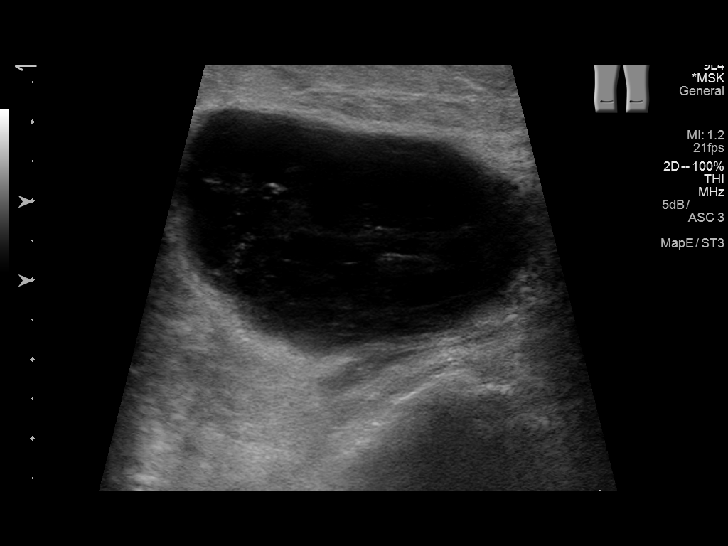
[im 19/29]
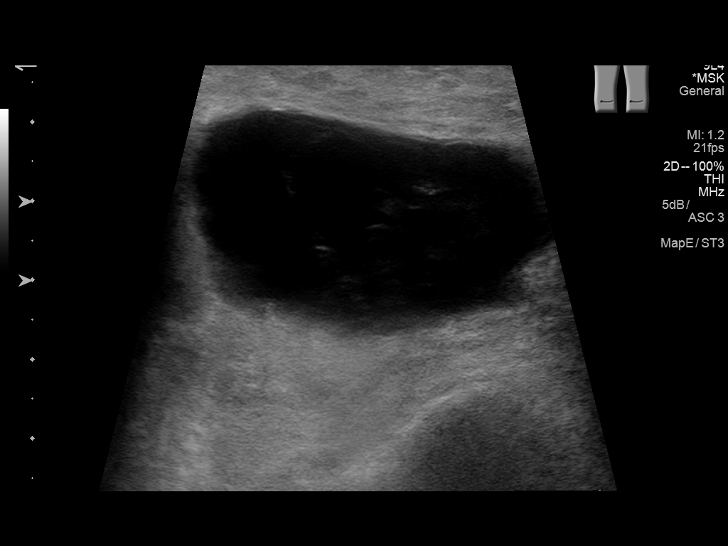
[im 22/29]
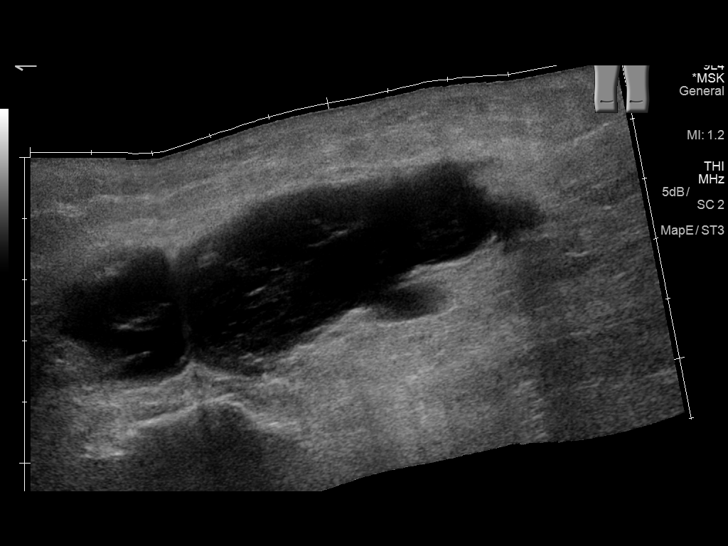
[im 24/29]
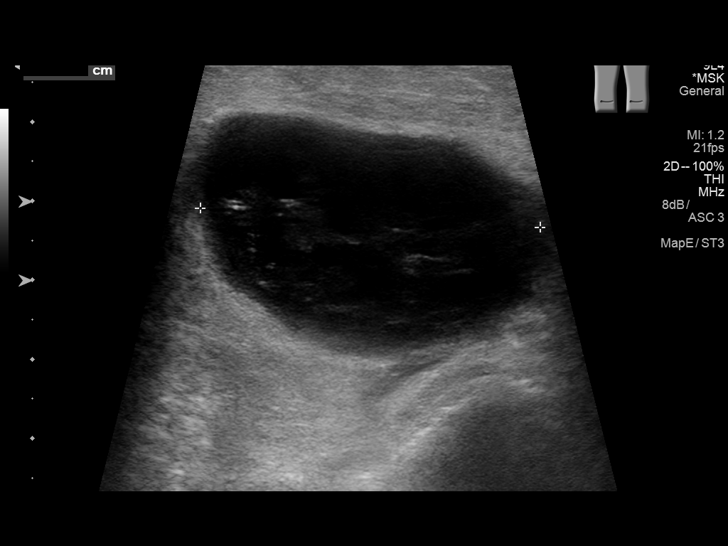
[im 26/29]
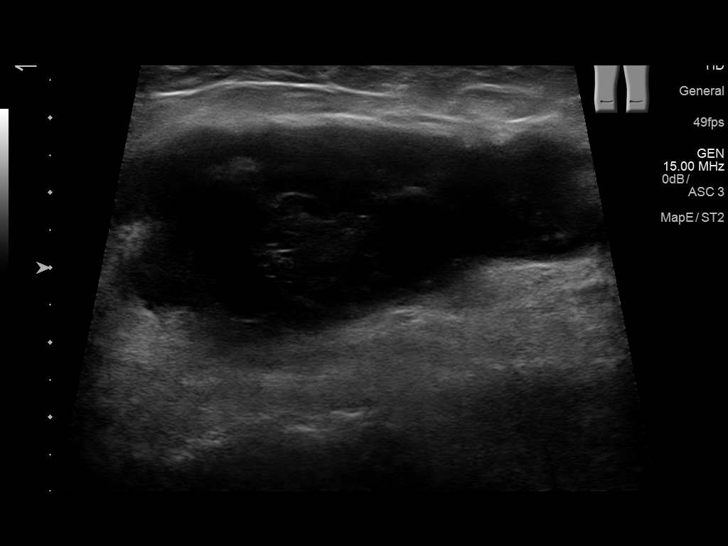
[im 29/29]
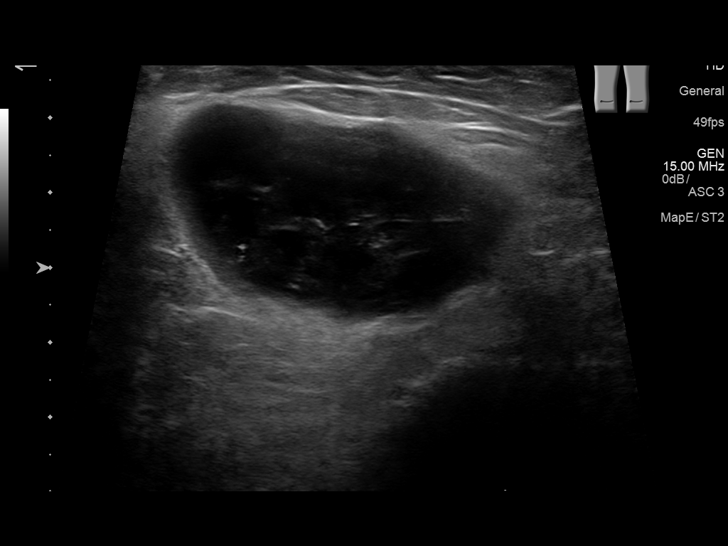

[14 of 25 positions shown; findings below may reference images not displayed]

FINDINGS: A 8.1 x 2.5 x 4.3 cm complex fluid collection is noted in the region
of the right hip. This is consistent the patient's known history of
hematoma. Other etiologies of cystic fluid collections can not be
excluded. No significant color flow was noted in the fluid
collection.No definite evidence of hernia noted.
IMPRESSION: 8.1 x 2.5 x 4.3 cm complex cystic fluid collection in the region of
the right hip consistent with the patient's history of hematoma.

## 2017-08-24 ENCOUNTER — Ambulatory Visit: Payer: PPO | Admitting: Neurology

## 2017-09-19 DIAGNOSIS — M216X2 Other acquired deformities of left foot: Secondary | ICD-10-CM | POA: Diagnosis not present

## 2017-09-19 DIAGNOSIS — L851 Acquired keratosis [keratoderma] palmaris et plantaris: Secondary | ICD-10-CM | POA: Diagnosis not present

## 2017-09-19 DIAGNOSIS — G1221 Amyotrophic lateral sclerosis: Secondary | ICD-10-CM | POA: Diagnosis not present

## 2017-09-19 DIAGNOSIS — M79672 Pain in left foot: Secondary | ICD-10-CM | POA: Diagnosis not present

## 2017-12-10 DIAGNOSIS — G1221 Amyotrophic lateral sclerosis: Secondary | ICD-10-CM | POA: Diagnosis not present

## 2017-12-10 DIAGNOSIS — M79675 Pain in left toe(s): Secondary | ICD-10-CM | POA: Diagnosis not present

## 2017-12-10 DIAGNOSIS — M216X2 Other acquired deformities of left foot: Secondary | ICD-10-CM | POA: Diagnosis not present

## 2017-12-10 DIAGNOSIS — M79674 Pain in right toe(s): Secondary | ICD-10-CM | POA: Diagnosis not present

## 2017-12-10 DIAGNOSIS — L851 Acquired keratosis [keratoderma] palmaris et plantaris: Secondary | ICD-10-CM | POA: Diagnosis not present

## 2017-12-10 DIAGNOSIS — B351 Tinea unguium: Secondary | ICD-10-CM | POA: Diagnosis not present

## 2017-12-26 ENCOUNTER — Emergency Department
Admission: EM | Admit: 2017-12-26 | Discharge: 2017-12-26 | Disposition: A | Discharge status: Hospice | Payer: PPO | Attending: Emergency Medicine | Admitting: Emergency Medicine

## 2017-12-26 ENCOUNTER — Other Ambulatory Visit: Payer: Self-pay

## 2017-12-26 ENCOUNTER — Encounter: Payer: Self-pay | Admitting: Emergency Medicine

## 2017-12-26 ENCOUNTER — Emergency Department: Payer: PPO

## 2017-12-26 DIAGNOSIS — Y939 Activity, unspecified: Secondary | ICD-10-CM | POA: Diagnosis not present

## 2017-12-26 DIAGNOSIS — Y92122 Bedroom in nursing home as the place of occurrence of the external cause: Secondary | ICD-10-CM | POA: Diagnosis not present

## 2017-12-26 DIAGNOSIS — R531 Weakness: Secondary | ICD-10-CM | POA: Diagnosis not present

## 2017-12-26 DIAGNOSIS — Y998 Other external cause status: Secondary | ICD-10-CM | POA: Diagnosis not present

## 2017-12-26 DIAGNOSIS — Z85528 Personal history of other malignant neoplasm of kidney: Secondary | ICD-10-CM | POA: Insufficient documentation

## 2017-12-26 DIAGNOSIS — Z853 Personal history of malignant neoplasm of breast: Secondary | ICD-10-CM | POA: Diagnosis not present

## 2017-12-26 DIAGNOSIS — S72331A Displaced oblique fracture of shaft of right femur, initial encounter for closed fracture: Secondary | ICD-10-CM | POA: Diagnosis not present

## 2017-12-26 DIAGNOSIS — J45909 Unspecified asthma, uncomplicated: Secondary | ICD-10-CM | POA: Insufficient documentation

## 2017-12-26 DIAGNOSIS — Z781 Physical restraint status: Secondary | ICD-10-CM | POA: Diagnosis not present

## 2017-12-26 DIAGNOSIS — Z85828 Personal history of other malignant neoplasm of skin: Secondary | ICD-10-CM | POA: Insufficient documentation

## 2017-12-26 DIAGNOSIS — G1221 Amyotrophic lateral sclerosis: Secondary | ICD-10-CM | POA: Diagnosis not present

## 2017-12-26 DIAGNOSIS — Z7401 Bed confinement status: Secondary | ICD-10-CM | POA: Diagnosis not present

## 2017-12-26 DIAGNOSIS — Z87891 Personal history of nicotine dependence: Secondary | ICD-10-CM | POA: Diagnosis not present

## 2017-12-26 DIAGNOSIS — M25551 Pain in right hip: Secondary | ICD-10-CM | POA: Diagnosis not present

## 2017-12-26 DIAGNOSIS — Z8673 Personal history of transient ischemic attack (TIA), and cerebral infarction without residual deficits: Secondary | ICD-10-CM | POA: Insufficient documentation

## 2017-12-26 DIAGNOSIS — W19XXXA Unspecified fall, initial encounter: Secondary | ICD-10-CM | POA: Insufficient documentation

## 2017-12-26 DIAGNOSIS — Z9989 Dependence on other enabling machines and devices: Secondary | ICD-10-CM | POA: Diagnosis not present

## 2017-12-26 DIAGNOSIS — S79911A Unspecified injury of right hip, initial encounter: Secondary | ICD-10-CM | POA: Diagnosis present

## 2017-12-26 DIAGNOSIS — S7291XA Unspecified fracture of right femur, initial encounter for closed fracture: Secondary | ICD-10-CM | POA: Diagnosis not present

## 2017-12-26 LAB — TROPONIN I

## 2017-12-26 LAB — CBC WITH DIFFERENTIAL/PLATELET
Basophils Absolute: 0 10*3/uL (ref 0–0.1)
Basophils Relative: 0 %
EOS PCT: 0 %
Eosinophils Absolute: 0 10*3/uL (ref 0–0.7)
HCT: 43.6 % (ref 35.0–47.0)
Hemoglobin: 14.2 g/dL (ref 12.0–16.0)
LYMPHS ABS: 0.9 10*3/uL — AB (ref 1.0–3.6)
LYMPHS PCT: 8 %
MCH: 32.3 pg (ref 26.0–34.0)
MCHC: 32.6 g/dL (ref 32.0–36.0)
MCV: 99 fL (ref 80.0–100.0)
MONO ABS: 0.4 10*3/uL (ref 0.2–0.9)
Monocytes Relative: 3 %
Neutro Abs: 10.3 10*3/uL — ABNORMAL HIGH (ref 1.4–6.5)
Neutrophils Relative %: 89 %
PLATELETS: 237 10*3/uL (ref 150–440)
RBC: 4.4 MIL/uL (ref 3.80–5.20)
RDW: 16.9 % — AB (ref 11.5–14.5)
WBC: 11.6 10*3/uL — ABNORMAL HIGH (ref 3.6–11.0)

## 2017-12-26 LAB — COMPREHENSIVE METABOLIC PANEL
ALT: 35 U/L (ref 14–54)
AST: 43 U/L — ABNORMAL HIGH (ref 15–41)
Albumin: 3.7 g/dL (ref 3.5–5.0)
Alkaline Phosphatase: 39 U/L (ref 38–126)
Anion gap: 10 (ref 5–15)
BUN: 24 mg/dL — ABNORMAL HIGH (ref 6–20)
CHLORIDE: 104 mmol/L (ref 101–111)
CO2: 26 mmol/L (ref 22–32)
Calcium: 8.9 mg/dL (ref 8.9–10.3)
Creatinine, Ser: 0.79 mg/dL (ref 0.44–1.00)
Glucose, Bld: 133 mg/dL — ABNORMAL HIGH (ref 65–99)
POTASSIUM: 4.5 mmol/L (ref 3.5–5.1)
Sodium: 140 mmol/L (ref 135–145)
Total Bilirubin: 1.6 mg/dL — ABNORMAL HIGH (ref 0.3–1.2)
Total Protein: 6.9 g/dL (ref 6.5–8.1)

## 2017-12-26 MED ORDER — ONDANSETRON HCL 4 MG/2ML IJ SOLN
INTRAMUSCULAR | Status: AC
  Administered 2017-12-26: 4 mg via INTRAVENOUS
  Filled 2017-12-26: qty 2

## 2017-12-26 MED ORDER — MORPHINE SULFATE (PF) 4 MG/ML IV SOLN
INTRAVENOUS | Status: AC
  Administered 2017-12-26: 4 mg via INTRAVENOUS
  Filled 2017-12-26: qty 1

## 2017-12-26 MED ORDER — ONDANSETRON 4 MG PO TBDP
ORAL_TABLET | ORAL | Status: AC
  Administered 2017-12-26: 4 mg via ORAL
  Filled 2017-12-26: qty 1

## 2017-12-26 MED ORDER — ONDANSETRON HCL 4 MG/2ML IJ SOLN
INTRAMUSCULAR | Status: AC
  Filled 2017-12-26: qty 2

## 2017-12-26 MED ORDER — HYDROMORPHONE HCL 1 MG/ML IJ SOLN
0.5000 mg | Freq: Once | INTRAMUSCULAR | Status: AC
  Administered 2017-12-26: 0.5 mg via INTRAVENOUS
  Filled 2017-12-26: qty 1

## 2017-12-26 MED ORDER — ONDANSETRON HCL 4 MG/2ML IJ SOLN
4.0000 mg | Freq: Once | INTRAMUSCULAR | Status: AC
  Administered 2017-12-26: 4 mg via INTRAVENOUS

## 2017-12-26 MED ORDER — ONDANSETRON 4 MG PO TBDP
4.0000 mg | ORAL_TABLET | Freq: Once | ORAL | Status: AC
  Administered 2017-12-26: 4 mg via ORAL

## 2017-12-26 MED ORDER — MORPHINE SULFATE (PF) 4 MG/ML IV SOLN
4.0000 mg | Freq: Once | INTRAVENOUS | Status: AC
  Administered 2017-12-26: 4 mg via INTRAVENOUS
  Filled 2017-12-26: qty 1

## 2017-12-26 MED ORDER — HYDROCODONE-ACETAMINOPHEN 10-300 MG/15ML PO SOLN: 15.0000 mL | Freq: Four times a day (QID) | ORAL | 0 refills | Status: AC | PRN

## 2017-12-26 MED ORDER — MORPHINE SULFATE (PF) 4 MG/ML IV SOLN
4.0000 mg | Freq: Once | INTRAVENOUS | Status: AC
  Administered 2017-12-26: 4 mg via INTRAVENOUS

## 2017-12-26 MED ORDER — MORPHINE SULFATE (PF) 4 MG/ML IV SOLN
4.0000 mg | Freq: Once | INTRAVENOUS | Status: DC
Start: 2017-12-26 — End: 2017-12-26

## 2017-12-26 MED ORDER — TRAMADOL HCL 50 MG PO TABS: 50.0000 mg | ORAL_TABLET | Freq: Four times a day (QID) | ORAL | 0 refills | Status: AC | PRN

## 2017-12-26 MED ORDER — ONDANSETRON HCL 4 MG/2ML IJ SOLN: 4.0000 mg | Freq: Once | INTRAMUSCULAR | Status: DC

## 2017-12-26 NOTE — ED Notes (Signed)
Patient's POA on home with [facility] who states that they have a bed for patient and will arrange for patient to be transported from this facility. MD aware of plan. Patient comfortable at this time. POA and personal CNA at bedside.

## 2017-12-26 NOTE — ED Provider Notes (Signed)
[facility] Emergency Department Provider Note       Time seen: ----------------------------------------- 8:00 AM on 12/26/2017 -----------------------------------------   I have reviewed the triage vital signs and the nursing notes.  HISTORY   Chief Complaint No chief complaint on file.    HPI Mary Fields is a 77 y.o. female with a history of ALS who presents to the ED for right hip pain after a fall.  Patient was found on the ground next to her bed and according to EMS there was noted to be right leg shortening.  She received fentanyl in route which helped her pain some as well as Zofran for nausea.  EMS also reports patient has not been taking her medications for the last month according to the facility.  At this point she is complaining of severe right hip pain only.  Past Medical History:  Diagnosis Date  . ALS (amyotrophic lateral sclerosis) ([place])   . ALS (amyotrophic lateral sclerosis) ([place])   . Atypical nevi   . Benign neoplasm of skin, site unspecified    Dr. Aubery Lapping  . Colitis   . 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 Disorder of bone and cartilage, unspecified   . Diverticulosis   . Dizziness and giddiness   . Dyspnea on exertion   . Erosive esophagitis   . Esophageal reflux   . Herpes simplex without mention of complication   . Hip fracture ([place]) 11/12   surgery  . Hx-TIA (transient ischemic attack)   . Malignant neoplasm of kidney, except pelvis   . 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 Other malaise and fatigue   . Palpitations   . Palpitations    a. 07/2015 Echo: EF 60-65%, Gr 1 DD, mild AI.  Marland Kitchen Personal history of malignant neoplasm of breast   . Personal history of other malignant neoplasm of skin   . Tobacco abuse   . Ulcerative colitis, unspecified   . Unspecified asthma(493.90)    since childhood  . Vaginal atrophy   . Vaginal stenosis     Patient Active Problem List   Diagnosis Date  Noted  . Sinusitis 12/28/2016  . Suprapubic pain 07/25/2016  . 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 ALS (amyotrophic lateral sclerosis) ([place]) 05/15/2016  . History of melanoma 01/20/2016  . Coagulation test abnormality 01/20/2016  . Hematoma 01/14/2016  . Right hip pain 12/28/2015  . Fall 12/28/2015  . Routine general medical examination at a health care facility 12/16/2015  . Memory loss 12/16/2015  . Muscle cramps at night 12/16/2015  . Dysarthria 12/16/2015  . Expressive aphasia 12/16/2015  . Bereavement 11/18/2015  . Hoarseness 09/22/2015  . Special screening for malignant neoplasms, colon 09/22/2015  . Dyspnea on exertion 07/21/2015  . History of renal cell cancer 09/07/2014  . Urinary incontinence 09/07/2014  . Contact dermatitis 04/08/2014  . Stopped smoking with greater than 25 pack year history 02/25/2014  . Irregular heart beat 01/30/2014  . Allergic rhinitis 01/30/2014  . Medicare annual wellness visit, subsequent 08/08/2012  . Screening for breast cancer 08/08/2012  . Atrophic vaginitis 08/08/2012  . GERD (gastroesophageal reflux disease) 05/07/2012  . History of cardiovascular disorder 04/30/2009  . COLITIS, ULCERATIVE 05/16/2007  . Personal history of malignant neoplasm of breast 05/16/2007    Past Surgical History:  Procedure Laterality Date  . ABDOMINAL HYSTERECTOMY  1986   endometriosis, Dr. Pearletha Furl  . APPENDECTOMY    . BREAST LUMPECTOMY Right 1995  right breast cancer, lumpectomy and XRT and chemo  . COLONOSCOPY  4/10   diverticulosis; recheck 5 years  . ESOPHAGOGASTRODUODENOSCOPY  2002   esophagitis; grade 1, erosive  . HIP FRACTURE SURGERY Right 2005   hemiarthroplasty right, Dr. Francia Greaves  . HIP FRACTURE SURGERY Left 11/12   after fall at church  . MELANOMA EXCISION  1980   Dr. Sharlet Salina  . NEPHRECTOMY  2000   left renal cell carcinoma, Dr. Jacqlyn Larsen    Allergies Prednisone; Sulfonamide derivatives; Meperidine; and Ciprofloxacin  Social  History Social History   Tobacco Use  . Smoking status: Former Smoker    Last attempt to quit: 08/02/1984    Years since quitting: 33.4  . Smokeless tobacco: Never Used  Substance Use Topics  . Alcohol use: Yes    Alcohol/week: 0.0 oz    Comment: 1 beer once a month  . Drug use: No    Review of Systems Constitutional: Negative for fever. Cardiovascular: Negative for chest pain. Respiratory: Negative for shortness of breath. Gastrointestinal: Negative for abdominal pain, vomiting and diarrhea. Musculoskeletal: Positive right hip pain Skin: Negative for rash. Neurological: Positive for chronic weakness  All systems negative/normal/unremarkable except as stated in the HPI  ____________________________________________   PHYSICAL EXAM:  VITAL SIGNS: ED Triage Vitals  Enc Vitals Group     BP      Pulse      Resp      Temp      Temp src      SpO2      Weight      Height      Head Circumference      Peak Flow      Pain Score      Pain Loc      Pain Edu?      Excl. in [place]?     Constitutional: Alert and oriented. Well appearing and in no distress. Eyes: Conjunctivae are normal. Normal extraocular movements. ENT   Head: Normocephalic and atraumatic.   Nose: No congestion/rhinnorhea.   Mouth/Throat: Mucous membranes are moist.   Neck: No stridor. Cardiovascular: Normal rate, regular rhythm. No murmurs, rubs, or gallops. Respiratory: Normal respiratory effort without tachypnea nor retractions. Breath sounds are clear and equal bilaterally. No wheezes/rales/rhonchi. Gastrointestinal: Soft and nontender. Normal bowel sounds Musculoskeletal: Pain with range of motion of the right hip, right lower extremity is shortened compared to left. Neurologic:  Normal speech and language. No gross focal neurologic deficits are appreciated.  Skin:  Skin is warm, dry and intact. No rash noted. Psychiatric: Mood and affect are normal. Speech and behavior are normal.   ____________________________________________  EKG: Interpreted by me.  Sinus rhythm the rate of 90 bpm, normal PR interval, wide QRS, normal QT.  ____________________________________________  ED COURSE:  As part of my medical decision making, I reviewed the following data within the [place] History obtained from family if available, nursing notes, old chart and ekg, as well as notes from prior ED visits. Patient presented for a fall and likely hip injury, we will assess with labs and imaging as indicated at this time.   Procedures ____________________________________________   LABS (pertinent positives/negatives)  Labs Reviewed  CBC WITH DIFFERENTIAL/PLATELET - Abnormal; Notable for the following components:      Result Value   WBC 11.6 (*)    RDW 16.9 (*)    Neutro Abs 10.3 (*)    Lymphs Abs 0.9 (*)    All other components within normal limits  COMPREHENSIVE METABOLIC PANEL - Abnormal; Notable for the following components:   Glucose, Bld 133 (*)    BUN 24 (*)    AST 43 (*)    Total Bilirubin 1.6 (*)    All other components within normal limits  TROPONIN I  URINALYSIS, COMPLETE (UACMP) WITH MICROSCOPIC    RADIOLOGY Images were viewed by me  Right hip x-ray IMPRESSION: Oblique fracture of the proximal RIGHT femur along the shaft of the prosthetic stem. Mild displacement distally. ____________________________________________  DIFFERENTIAL DIAGNOSIS   Contusion, fracture, dislocation, dehydration, electrolyte abnormality  FINAL ASSESSMENT AND PLAN  Fall, right femur fracture   Plan: The patient had presented for fall and hip pain. Patient's labs did not reveal any acute process. Patient's imaging did reveal a fracture of the right femur along the shaft of the prosthesis.  I discussed with orthopedics who initially recommended transfer to [facility].  Family and caregivers state that she is not wishing to pursue any further treatment or have any  more surgery.  She wants to go back to her residence and will be placed in hospice.  Again he is with the expressed wishes of the patient.  She also did not want more pain medicine because it made her feel dizzy.  We will attempt to transfer her back to her residence.   Laurence Aly, MD   Note: This note was generated in part or whole with voice recognition software. Voice recognition is usually quite accurate but there are transcription errors that can and very often do occur. I apologize for any typographical errors that were not detected and corrected.     Earleen Newport, MD 12/26/17 1047

## 2017-12-26 NOTE — ED Triage Notes (Signed)
Pt presents to ED via [facility] with c/o R hip pain s/p fall. Pt was found on ground next to bed. Per EMS pt has R hip shortening at this time. Pt has hx of ALS and is nonverbal however is able to communicate through writing an don the computer and through gestures. EMS reports 76mcg of Fentanyl given en route with 4mg  Zofran. EMS also reports that patient has not been taking her medications x 1 month per facility.

## 2017-12-26 NOTE — ED Notes (Signed)
Patient refusing discharge vitals at this time. States she just wants to go. EMS aware and ok with this decision.

## 2017-12-26 NOTE — Progress Notes (Signed)
LCSW spoke EDRN and explained that EMS transportation is arranged though our ED Secretary. Spoke to [facility] [phone] and explained EMS transport from [place] to [facility] may take a while. Sherlyn Hay stated any time after 2pm would be fine. LCSW assured patient that we would work it out and for her not to worry. LCSW left address and instructions with ED secretary on their desk.  LCSW spoke with ED secretary and Raquel Sarna will call EMS and send patient to the following address in [place]  [phone].   No further needs

## 2017-12-26 NOTE — ED Notes (Signed)
Spoke with Hospice nurse from [place] who requested that this RN arrange transportation for patient to [facility] after 2 pm. This RN informed hospice nurse that social work would have to arrange transportation. Telephone number given to nurse.

## 2017-12-26 NOTE — ED Notes (Signed)
Attempt for IV access X 2 to left arm unsuccessful. Pt requesting IM medications vs IV. Mickel Baas, RN aware.

## 2017-12-26 NOTE — ED Notes (Signed)
Pt returned from radiology at this time.  

## 2017-12-26 NOTE — Clinical Social Work Note (Addendum)
Clinical Social Work Assessment  Patient Details  Name: Mary Fields MRN: [id number] Date of Birth: Nov 19, 1940  Date of referral:  12/26/17               Reason for consult:  End of Life/Hospice                Permission sought to share information with:    Permission granted to share information::  Yes, Verbal Permission Granted  Name::     Mary Fields [phone]  Agency::  [facility] [facility] ( [facility])  Relationship::  yes  Contact Information:  yes  Housing/Transportation Living arrangements for the past 2 months:  [place] of Information:  Patient, Medical laboratory scientific officer Patient Interpreter Needed:  None Criminal Activity/Legal Involvement Pertinent to Current Situation/Hospitalization:  No - Comment as needed Significant Relationships:  Friend, Other Family Members Lives with:  Self Do you feel safe going back to the place where you live?  Yes Need for family participation in patient care:  No (Coment)  Care giving concerns: HCPOA Ms Mary Fields and patient- expressed they do not wish any treatment. Patient wants to go home and wait for [facility] bed. DNR to be respected as per [facility]   Social Worker assessment / plan: Mary Fields is a 77 year old female who came to the hospital because she had a fall. 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 She is oriented x4. LCSW introduced myself to patient and HCPOA and her CNA Museum/gallery conservator and obtained verbal consent to speak amongst her friends. Patient has an apartment at the [place] and has resided there for 2 years she has a Quarry manager- 4x week and HCPOA who visits daily. Patient wants increased in home care or bed at [facility] placement. LCSW respectfully agreed to advocate her end of life wishes and sought out Dr Gwyndolyn Saxon. He presented a plan of care and patient refused treatment and will dc back to her apartment until [facility] can arrange a bed. Currently there is a  DNR. LCSW and EDP did suggest comfort care and pain management and patient respectfully declined. Her HCPOA requested she and patient will wait until she hears back from [place] at [facility]. EDP agreed. LCSW offered warm blankets to CNA and refreshments for patients caregivers and reviewed caregiver stress and offered good self care measures. Refreshments were offered and patients friend declined. Patient is DNR No further needs at this time.  Employment status:  Retired Forensic scientist:  Other (Comment Required)([employer]) PT Recommendations:  Not assessed at this time Information / Referral to community resources:  Other (Comment Required)(None requested Patient attached to [place])  Patient/Family's Response to care:  HCPOA will follow patient directives.  Patient/Family's Understanding of and Emotional Response to Diagnosis, Current Treatment, and Prognosis:  Good understanding  Emotional Assessment Appearance:  Appears stated age Attitude/Demeanor/Rapport:  Guarded(Very insistant to refuse all treatment and used communication board to express her wishes.) Affect (typically observed):  Appropriate, Guarded Orientation:  Oriented to Self, Oriented to Place, Oriented to  Time, Oriented to Situation Alcohol / Substance use:  Not Applicable Psych involvement (Current and /or in the community):     Discharge Needs  Concerns to be addressed:  No discharge needs identified Readmission within the last 30 days:  No Current discharge risk:  None Barriers to Discharge:  No Barriers Identified   Mary Reamer, LCSW 12/26/2017, 10:46 AM

## 2017-12-27 ENCOUNTER — Inpatient Hospital Stay: Admit: 2017-12-27 | Payer: PPO | Admitting: Orthopedic Surgery

## 2017-12-27 SURGERY — OPEN REDUCTION INTERNAL FIXATION FEMORAL SHAFT FRACTURE
Anesthesia: General | Laterality: Right

## 2018-01-21 DEATH — deceased
